# Patient Record
Sex: Female | Born: 1950 | Race: White | Hispanic: No | Marital: Married | Smoking: Former smoker
Health system: Southern US, Community
[De-identification: ages and names within clinical notes are randomized; demographics above are authoritative.]

## PROBLEM LIST (undated history)

## (undated) DIAGNOSIS — Z9221 Personal history of antineoplastic chemotherapy: Secondary | ICD-10-CM

## (undated) DIAGNOSIS — E7800 Pure hypercholesterolemia, unspecified: Secondary | ICD-10-CM

## (undated) DIAGNOSIS — C3490 Malignant neoplasm of unspecified part of unspecified bronchus or lung: Secondary | ICD-10-CM

## (undated) DIAGNOSIS — Z923 Personal history of irradiation: Secondary | ICD-10-CM

## (undated) DIAGNOSIS — I82409 Acute embolism and thrombosis of unspecified deep veins of unspecified lower extremity: Secondary | ICD-10-CM

---

## 2000-05-31 ENCOUNTER — Other Ambulatory Visit: Admission: RE | Admit: 2000-05-31 | Discharge: 2000-05-31 | Payer: Self-pay | Admitting: Obstetrics and Gynecology

## 2002-04-03 ENCOUNTER — Other Ambulatory Visit: Admission: RE | Admit: 2002-04-03 | Discharge: 2002-04-03 | Payer: Self-pay | Admitting: Obstetrics and Gynecology

## 2002-12-24 ENCOUNTER — Emergency Department (HOSPITAL_COMMUNITY): Admission: EM | Admit: 2002-12-24 | Discharge: 2002-12-24 | Payer: Self-pay | Admitting: Emergency Medicine

## 2002-12-24 ENCOUNTER — Encounter: Payer: Self-pay | Admitting: Emergency Medicine

## 2003-11-13 ENCOUNTER — Encounter: Admission: RE | Admit: 2003-11-13 | Discharge: 2003-11-13 | Payer: Self-pay | Admitting: Obstetrics and Gynecology

## 2003-12-27 ENCOUNTER — Other Ambulatory Visit: Admission: RE | Admit: 2003-12-27 | Discharge: 2003-12-27 | Payer: Self-pay | Admitting: Obstetrics and Gynecology

## 2004-03-06 ENCOUNTER — Ambulatory Visit: Payer: Self-pay | Admitting: Internal Medicine

## 2004-03-06 ENCOUNTER — Encounter: Admission: RE | Admit: 2004-03-06 | Discharge: 2004-03-06 | Payer: Self-pay | Admitting: Internal Medicine

## 2004-03-07 ENCOUNTER — Ambulatory Visit: Payer: Self-pay | Admitting: Internal Medicine

## 2004-05-12 ENCOUNTER — Ambulatory Visit: Payer: Self-pay | Admitting: Internal Medicine

## 2004-05-16 ENCOUNTER — Encounter: Admission: RE | Admit: 2004-05-16 | Discharge: 2004-05-16 | Payer: Self-pay | Admitting: Internal Medicine

## 2004-10-13 ENCOUNTER — Ambulatory Visit: Payer: Self-pay | Admitting: Internal Medicine

## 2004-11-25 ENCOUNTER — Ambulatory Visit: Payer: Self-pay | Admitting: Internal Medicine

## 2005-09-10 ENCOUNTER — Ambulatory Visit: Payer: Self-pay | Admitting: Internal Medicine

## 2011-03-07 LAB — HM COLONOSCOPY: HM Colonoscopy: NORMAL

## 2011-03-07 LAB — HM PAP SMEAR: HM Pap smear: NEGATIVE

## 2011-03-07 LAB — HM MAMMOGRAPHY: HM Mammogram: NEGATIVE

## 2011-10-14 ENCOUNTER — Emergency Department (HOSPITAL_COMMUNITY)
Admission: EM | Admit: 2011-10-14 | Discharge: 2011-10-14 | Disposition: A | Discharge status: Home / Self Care | Payer: 59 | Attending: Emergency Medicine | Admitting: Emergency Medicine

## 2011-10-14 ENCOUNTER — Encounter (HOSPITAL_COMMUNITY): Payer: Self-pay | Admitting: Emergency Medicine

## 2011-10-14 DIAGNOSIS — S61209A Unspecified open wound of unspecified finger without damage to nail, initial encounter: Secondary | ICD-10-CM | POA: Insufficient documentation

## 2011-10-14 DIAGNOSIS — Z86718 Personal history of other venous thrombosis and embolism: Secondary | ICD-10-CM | POA: Insufficient documentation

## 2011-10-14 DIAGNOSIS — Y92009 Unspecified place in unspecified non-institutional (private) residence as the place of occurrence of the external cause: Secondary | ICD-10-CM | POA: Insufficient documentation

## 2011-10-14 DIAGNOSIS — Y9389 Activity, other specified: Secondary | ICD-10-CM | POA: Insufficient documentation

## 2011-10-14 DIAGNOSIS — W278XXA Contact with other nonpowered hand tool, initial encounter: Secondary | ICD-10-CM | POA: Insufficient documentation

## 2011-10-14 DIAGNOSIS — S61412A Laceration without foreign body of left hand, initial encounter: Secondary | ICD-10-CM

## 2011-10-14 DIAGNOSIS — E7800 Pure hypercholesterolemia, unspecified: Secondary | ICD-10-CM | POA: Insufficient documentation

## 2011-10-14 DIAGNOSIS — T148XXA Other injury of unspecified body region, initial encounter: Secondary | ICD-10-CM

## 2011-10-14 DIAGNOSIS — Y998 Other external cause status: Secondary | ICD-10-CM | POA: Insufficient documentation

## 2011-10-14 DIAGNOSIS — Z7901 Long term (current) use of anticoagulants: Secondary | ICD-10-CM | POA: Insufficient documentation

## 2011-10-14 HISTORY — DX: Pure hypercholesterolemia, unspecified: E78.00

## 2011-10-14 HISTORY — DX: Acute embolism and thrombosis of unspecified deep veins of unspecified lower extremity: I82.409

## 2011-10-14 LAB — PROTIME-INR
INR: 2.88 — ABNORMAL HIGH (ref 0.00–1.49)
Prothrombin Time: 30.6 seconds — ABNORMAL HIGH (ref 11.6–15.2)

## 2011-10-14 MED ORDER — TETANUS-DIPHTH-ACELL PERTUSSIS 5-2.5-18.5 LF-MCG/0.5 IM SUSP
0.5000 mL | Freq: Once | INTRAMUSCULAR | Status: AC
  Administered 2011-10-14: 0.5 mL via INTRAMUSCULAR
  Filled 2011-10-14: qty 0.5

## 2011-10-14 NOTE — Discharge Instructions (Signed)
Keep pressure dressing on. Keep hand elevated. Ice it several times a day. Apply neosporin twice a day. Watch for signs of infection such as redness, drainage. Follow up with a family doctor. Return if swelling worsening, have numbness or tingling in fingers, uncontrolled bleeding.   Laceration Care, Adult A laceration is a cut or lesion that goes through all layers of the skin and into the tissue just beneath the skin. TREATMENT  Some lacerations may not require closure. Some lacerations may not be able to be closed due to an increased risk of infection. It is important to see your caregiver as soon as possible after an injury to minimize the risk of infection and maximize the opportunity for successful closure. If closure is appropriate, pain medicines may be given, if needed. The wound will be cleaned to help prevent infection. Your caregiver will use stitches (sutures), staples, wound glue (adhesive), or skin adhesive strips to repair the laceration. These tools bring the skin edges together to allow for faster healing and a better cosmetic outcome. However, all wounds will heal with a scar. Once the wound has healed, scarring can be minimized by covering the wound with sunscreen during the day for 1 full year. HOME CARE INSTRUCTIONS  For sutures or staples:  Keep the wound clean and dry.   If you were given a bandage (dressing), you should change it at least once a day. Also, change the dressing if it becomes wet or dirty, or as directed by your caregiver.   Wash the wound with soap and water 2 times a day. Rinse the wound off with water to remove all soap. Pat the wound dry with a clean towel.   After cleaning, apply a thin layer of the antibiotic ointment as recommended by your caregiver. This will help prevent infection and keep the dressing from sticking.   You may shower as usual after the first 24 hours. Do not soak the wound in water until the sutures are removed.   Only take  over-the-counter or prescription medicines for pain, discomfort, or fever as directed by your caregiver.   Get your sutures or staples removed as directed by your caregiver.  For skin adhesive strips:  Keep the wound clean and dry.   Do not get the skin adhesive strips wet. You may bathe carefully, using caution to keep the wound dry.   If the wound gets wet, pat it dry with a clean towel.   Skin adhesive strips will fall off on their own. You may trim the strips as the wound heals. Do not remove skin adhesive strips that are still stuck to the wound. They will fall off in time.  For wound adhesive:  You may briefly wet your wound in the shower or bath. Do not soak or scrub the wound. Do not swim. Avoid periods of heavy perspiration until the skin adhesive has fallen off on its own. After showering or bathing, gently pat the wound dry with a clean towel.   Do not apply liquid medicine, cream medicine, or ointment medicine to your wound while the skin adhesive is in place. This may loosen the film before your wound is healed.   If a dressing is placed over the wound, be careful not to apply tape directly over the skin adhesive. This may cause the adhesive to be pulled off before the wound is healed.   Avoid prolonged exposure to sunlight or tanning lamps while the skin adhesive is in place. Exposure to ultraviolet  light in the first year will darken the scar.   The skin adhesive will usually remain in place for 5 to 10 days, then naturally fall off the skin. Do not pick at the adhesive film.  You may need a tetanus shot if:  You cannot remember when you had your last tetanus shot.   You have never had a tetanus shot.  If you get a tetanus shot, your arm may swell, get red, and feel warm to the touch. This is common and not a problem. If you need a tetanus shot and you choose not to have one, there is a rare chance of getting tetanus. Sickness from tetanus can be serious. SEEK MEDICAL CARE  IF:   You have redness, swelling, or increasing pain in the wound.   You see a red line that goes away from the wound.   You have yellowish-white fluid (pus) coming from the wound.   You have a fever.   You notice a bad smell coming from the wound or dressing.   Your wound breaks open before or after sutures have been removed.   You notice something coming out of the wound such as wood or glass.   Your wound is on your hand or foot and you cannot move a finger or toe.  SEEK IMMEDIATE MEDICAL CARE IF:   Your pain is not controlled with prescribed medicine.   You have severe swelling around the wound causing pain and numbness or a change in color in your arm, hand, leg, or foot.   Your wound splits open and starts bleeding.   You have worsening numbness, weakness, or loss of function of any joint around or beyond the wound.   You develop painful lumps near the wound or on the skin anywhere on your body.  MAKE SURE YOU:   Understand these instructions.   Will watch your condition.   Will get help right away if you are not doing well or get worse.  Document Released: 04/20/2005 Document Revised: 04/09/2011 Document Reviewed: 10/14/2010 [facility] Patient Information 2012 [place], [place]. Hematoma A hematoma is a pocket of blood that collects under the skin, in an organ, in a body space, in a joint space, or in other tissue. The blood can clot to form a lump that you can see and feel. The lump is often firm, sore, and sometimes even painful and tender. Most hematomas get better in a few days to weeks. However, some hematomas may be serious and require medical care.Hematomas can range in size from very small to very large. CAUSES  A hematoma can be caused by a blunt or penetrating injury. It can also be caused by leakage from a blood vessel under the skin. Spontaneous leakage from a blood vessel is more likely to occur in elderly people, especially those taking blood thinners.  Sometimes, a hematoma can develop after certain medical procedures. SYMPTOMS  Unlike a bruise, a hematoma forms a firm lump that you can feel. This lump is the collection of blood. The collection of blood can also cause your skin to turn a blue to dark blue color. If the hematoma is close to the surface of the skin, it often produces a yellowish color in the skin. DIAGNOSIS  Your caregiver can determine whether you have a hematoma based on your history and a physical exam. TREATMENT  Hematomas usually go away on their own over time. Rarely does the blood need to be drained out of the body. HOME CARE  INSTRUCTIONS   Put ice on the injured area.   Put ice in a plastic bag.   Place a towel between your skin and the bag.   Leave the ice on for 15 to 20 minutes, 3 to 4 times a day for the first 1 to 2 days.   After the first 2 days, switch to using warm compresses on the hematoma.   Elevate the injured area to help decrease pain and swelling. Wrapping the area with an elastic bandage may also be helpful. Compression helps to reduce swelling and promotes shrinking of the hematoma. Make sure the bandage is not wrapped too tight.   If your hematoma is on a lower extremity and is painful, crutches may be helpful for a couple days.   Only take over-the-counter or prescription medicines for pain, discomfort, or fever as directed by your caregiver. Most patients can take acetaminophen or ibuprofen for the pain.  SEEK IMMEDIATE MEDICAL CARE IF:   You have increasing pain, or your pain is not controlled with medicine.   You have a fever.   You have worsening swelling or discoloration.   Your skin over the hematoma breaks or starts bleeding.  MAKE SURE YOU:   Understand these instructions.   Will watch your condition.   Will get help right away if you are not doing well or get worse.  Document Released: 12/03/2003 Document Revised: 04/09/2011 Document Reviewed: 12/22/2010 [facility] Patient  Information 2012 [place], [place].

## 2011-10-14 NOTE — ED Notes (Signed)
Patient states that she was trying to open a box when the scissors cut her Left had. Bruising and laceration noted

## 2011-10-14 NOTE — ED Provider Notes (Signed)
History     CSN: [id number]  Arrival date & time 10/14/11  1811   First MD Initiated Contact with Patient 10/14/11 1818      Chief Complaint  Patient presents with  . Extremity Laceration  . Hand Injury    (Consider location/radiation/quality/duration/timing/severity/associated sxs/prior treatment) Patient is a 61 y.o. female presenting with hand injury. The history is provided by the patient.  Hand Injury  The incident occurred 6 to 12 hours ago. The incident occurred at home. Pertinent negatives include no fever.  Pt states she was opening a box with a pair of scissors and cut her left hand. States it is a puncture mark, bleeding stopped, however, area began to swell. Pt states she is worried because she is on coumadin. Pt denies difficulty moving fingers, numbness, tingling in fingers. No other injuries.   Past Medical History  Diagnosis Date  . DVT (deep venous thrombosis)     1 year ago  . Hypercholesteremia     Past Surgical History  Procedure Date  . Cesarean section     History reviewed. No pertinent family history.  History  Substance Use Topics  . Smoking status: Never Smoker   . Smokeless tobacco: Not on file  . Alcohol Use: No    OB History    Grav Para Term Preterm Abortions TAB SAB Ect Mult Living                  Review of Systems  Constitutional: Negative for fever and chills.  Respiratory: Negative.   Cardiovascular: Negative.   Musculoskeletal: Negative.   Skin: Positive for wound.  Neurological: Negative for weakness and numbness.  Hematological: Bruises/bleeds easily.    Allergies  Review of patient's allergies indicates no known allergies.  Home Medications   Current Outpatient Rx  Name Route Sig Dispense Refill  . ACETAMINOPHEN 500 MG PO TABS Oral Take 500 mg by mouth every 6 (six) hours as needed. For pain.    . ATORVASTATIN CALCIUM 10 MG PO TABS Oral Take 10 mg by mouth daily.    Marland Kitchen VITAMIN D (ERGOCALCIFEROL) 50000 UNITS PO  CAPS Oral Take 50,000 Units by mouth every 7 (seven) days.    . WARFARIN SODIUM 3 MG PO TABS Oral Take 3 mg by mouth daily. Taken with 5mg  tablet to equal 8mg  daily.    . WARFARIN SODIUM 5 MG PO TABS Oral Take 5 mg by mouth daily. Taken with 3mg  tablet to equal 8mg  daily.      BP 131/94  Pulse 89  Temp 98.4 F (36.9 C) (Oral)  Resp 16  SpO2 100%  Physical Exam  Nursing note and vitals reviewed. Constitutional: She is oriented to person, place, and time. She appears well-developed and well-nourished. No distress.  HENT:  Head: Normocephalic.  Eyes: Conjunctivae are normal.  Cardiovascular: Normal rate, regular rhythm and normal heart sounds.   Pulmonary/Chest: Effort normal and breath sounds normal. No respiratory distress. She has no wheezes. She has no rales.  Musculoskeletal: Normal range of motion.       Full ROM of all fingers, normal strength against resistance. Normal sensation of all fingers. Good cap refill <2sec of all distal fingers  Neurological: She is alert and oriented to person, place, and time.  Skin: Skin is warm and dry.       2cm laceration to the dorsal ulnar hand. Hemostatic. Surrounded by hematoma, about 4cm in diameter. Tender.     ED Course  Procedures (including critical care  time)  Left hand puncture wound with hematoma. Pt on coumadin. No evidence of compartment syndrome. Good cap refil. Wound hemostatic. Will check INR. Pressure dressing and ACE wrap applied to the hand for swelling.  will  d /c home with follow up.   1. Laceration of left hand   2. Hematoma       MDM         Lottie Mussel, PA 10/15/11 [phone]

## 2011-10-16 NOTE — ED Provider Notes (Signed)
Medical screening examination/treatment/procedure(s) were performed by non-physician practitioner and as supervising physician I was immediately available for consultation/collaboration.  Flint Melter, MD 10/16/11 1008

## 2011-11-04 ENCOUNTER — Encounter: Payer: Self-pay | Admitting: Family Medicine

## 2011-11-04 ENCOUNTER — Ambulatory Visit (INDEPENDENT_AMBULATORY_CARE_PROVIDER_SITE_OTHER): Payer: 59 | Admitting: Family Medicine

## 2011-11-04 VITALS — BP 110/72 | HR 80 | Temp 98.2°F | Resp 12 | Ht 64.75 in | Wt 156.0 lb

## 2011-11-04 DIAGNOSIS — I82409 Acute embolism and thrombosis of unspecified deep veins of unspecified lower extremity: Secondary | ICD-10-CM

## 2011-11-04 DIAGNOSIS — E785 Hyperlipidemia, unspecified: Secondary | ICD-10-CM

## 2011-11-04 DIAGNOSIS — F17200 Nicotine dependence, unspecified, uncomplicated: Secondary | ICD-10-CM

## 2011-11-04 NOTE — Progress Notes (Signed)
  Subjective:    Patient ID: Jenny [name], female    DOB: 19-Feb-1951, 61 y.o.   MRN: [id number]  HPI  Patient is seen to establish care. Was seen at this practice remotely several years ago. She and her husband just recently moved back from [place]. Past medical history significant for recurrent DVT right lower extremity. She takes Coumadin 8 mg daily. Last INR on 6-12 2.88. Needs to establish for that. Hyperlipidemia treated with Lipitor 10 mg daily. No history of CAD or peripheral vascular disease. Reportedly history of prediabetes but recent labs prior to leaving [place]. Ongoing nicotine use. Previously prescribe Chantix but never started. Smokes less than one pack a day.  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    She had complete physical last November with normal Pap smear, normal mammogram, and normal colonoscopy.  She is married. No alcohol use. Smoking history as above. Family history significant for followup colon cancer history. Mother had hypertension and stroke history.   Review of Systems  Constitutional: Negative for fever, chills, activity change, appetite change, fatigue and unexpected weight change.  Respiratory: Negative for cough and shortness of breath.   Cardiovascular: Negative for leg swelling.       Objective:   Physical Exam  Constitutional: She is oriented to person, place, and time. She appears well-developed and well-nourished.  HENT:  Mouth/Throat: Oropharynx is clear and moist.  Neck: Neck supple. No thyromegaly present.       No carotid bruits  Cardiovascular: Normal rate and regular rhythm.  Exam reveals no gallop.   Pulmonary/Chest: Effort normal and breath sounds normal. No respiratory distress. She has no wheezes. She has no rales.  Musculoskeletal: She exhibits no edema.  Neurological: She is alert and oriented to person, place, and time. No cranial nerve deficit.  Psychiatric: She has a normal mood and affect. Her behavior is normal.           Assessment & Plan:  #1 history of recurrent DVT right lower extremity. Patient on chronic Coumadin. Recent INR 2 weeks ago therapeutic. Schedule repeat INR in Coumadin clinic here in 2 weeks #2 hyperlipidemia on Lipitor. Send for records  #3 history of reported prediabetes  #4 ongoing nicotine use. Patient will consider starting back Chantix which she has prescription for.

## 2011-11-18 ENCOUNTER — Encounter: Payer: 59 | Admitting: Family

## 2012-01-22 ENCOUNTER — Telehealth: Payer: Self-pay | Admitting: Family Medicine

## 2012-01-22 NOTE — Telephone Encounter (Signed)
Caller: Annya/Patient; Patient Name: Jenny [name]; PCP: Evelena Peat [facility]); Best Callback Phone Number: [phone]; Reason for call: Chest Pain/Chest Discomfort THE PATIENT REFUSED 911 Has pain in chest since 9-20. Started 30 minutes prior to call. Is "tightening" pain.  Is severe when happens.  Pain will last 10 seconds and then start again "a few minutes later". Has had "flutttering" of heart. Advised 911.

## 2012-01-22 NOTE — Telephone Encounter (Signed)
Spoke with Dr Caryl Never and he agrees that patient should go to the ER.  Spoke with patient and states that she "is talking with her husband and probably will go to the hospital. The chest pain was getting a little better, but she will go to the hospital if needed".

## 2012-02-25 ENCOUNTER — Encounter: Payer: Self-pay | Admitting: Family Medicine

## 2012-02-25 ENCOUNTER — Ambulatory Visit (INDEPENDENT_AMBULATORY_CARE_PROVIDER_SITE_OTHER): Payer: 59 | Admitting: Family

## 2012-02-25 ENCOUNTER — Ambulatory Visit (INDEPENDENT_AMBULATORY_CARE_PROVIDER_SITE_OTHER)
Admission: RE | Admit: 2012-02-25 | Discharge: 2012-02-25 | Disposition: A | Discharge status: Home / Self Care | Payer: 59 | Source: Ambulatory Visit | Attending: Family Medicine | Admitting: Family Medicine

## 2012-02-25 ENCOUNTER — Ambulatory Visit (INDEPENDENT_AMBULATORY_CARE_PROVIDER_SITE_OTHER): Payer: 59 | Admitting: Family Medicine

## 2012-02-25 VITALS — BP 98/62 | Temp 98.2°F | Wt 154.0 lb

## 2012-02-25 DIAGNOSIS — M79651 Pain in right thigh: Secondary | ICD-10-CM

## 2012-02-25 DIAGNOSIS — R208 Other disturbances of skin sensation: Secondary | ICD-10-CM

## 2012-02-25 DIAGNOSIS — M79609 Pain in unspecified limb: Secondary | ICD-10-CM

## 2012-02-25 DIAGNOSIS — R209 Unspecified disturbances of skin sensation: Secondary | ICD-10-CM

## 2012-02-25 DIAGNOSIS — E785 Hyperlipidemia, unspecified: Secondary | ICD-10-CM

## 2012-02-25 DIAGNOSIS — I82409 Acute embolism and thrombosis of unspecified deep veins of unspecified lower extremity: Secondary | ICD-10-CM

## 2012-02-25 NOTE — Patient Instructions (Addendum)
Follow up immediately for any loss of urine or stool control. Follow up immediately for any rapid progression of weakness right lower extremity

## 2012-02-25 NOTE — Progress Notes (Signed)
  Subjective:    Patient ID: Jenny [name], female    DOB: 04/25/1951, 61 y.o.   MRN: [id number]  HPI  Right lower any pain. Patient relates a least 2 weeks of some burning discomfort and occasional numbness right lateral thigh. No low back pain. No leg pain. No known injury. Pain is 8/10 at its worst. Frequently noted at rest. She's also noted questionable weakness with going up stairs and climbing hills. No urine or stool incontinence. No fevers or chills. She has history of right lower extremity DVT but this pain is much different. She remains on Coumadin with INR 4.5 today. No recent bleeding complications. Does not report any claudication-type symptoms.  Past Medical History  Diagnosis Date  . DVT (deep venous thrombosis)     1 year ago  . Hypercholesteremia    Past Surgical History  Procedure Date  . Cesarean section     reports that she has been smoking Cigarettes.  She has a 19.5 pack-year smoking history. She does not have any smokeless tobacco history on file. She reports that she does not drink alcohol or use illicit drugs. family history includes Cancer in her father; Hypertension in her mother; and Stroke in her mother. No Known Allergies    Review of Systems  Constitutional: Negative for appetite change and unexpected weight change.  Respiratory: Negative for cough and shortness of breath.   Neurological: Positive for weakness and numbness.  Hematological: Does not bruise/bleed easily.       Objective:   Physical Exam  Constitutional: She appears well-developed and well-nourished.  Cardiovascular: Normal rate and regular rhythm.   Pulmonary/Chest: Effort normal and breath sounds normal. No respiratory distress. She has no wheezes. She has no rales.  Musculoskeletal:       Full range of motion right hip. No lower extremity edema. Faintly palpable dorsalis pedis pulses bilaterally. Patient has some subtle decreased muscle tone right quadriceps compared to left.    Neurological:       Deep tender reflexes 2+ and symmetric ankle and 2+ knee bilaterally. She has slight weakness with right knee extension and hip flexion. Mild impairment to touch right lateral thigh          Assessment & Plan:  Right lower extremity pain. Burning sensation and numbness right lateral thigh along with weakness with knee extension suggest possible L3 nerve root impingement. Start with lumbar spine films. May need MRI to further assess Hyperlipidemia.  Check lipid and hepatic panel.

## 2012-02-25 NOTE — Progress Notes (Signed)
  Subjective:    Patient ID: Jenny [name], female    DOB: 1950/10/05, 61 y.o.   MRN: [id number]  HPI    Review of Systems  Constitutional: Negative for appetite change and unexpected weight change.       Objective:   Physical Exam        Assessment & Plan:

## 2012-02-25 NOTE — Patient Instructions (Signed)
Hold Coumadin x 2 days. Eat plenty of greens. Then resume Coumadin at 8mg . Recheck INR in 2 weeks.     Latest dosing instructions   Total Sun Mon Tue Wed Thu Fri Sat   56 5 mg 5 mg 5 mg 5 mg 5 mg 5 mg 5 mg    (5 mg1) (5 mg1) (5 mg1) (5 mg1) (5 mg1) (5 mg1) (5 mg1)    Addl Tabs  3 mg 3 mg 3 mg 3 mg 3 mg 3 mg 3 mg    (3 mg1) (3 mg1) (3 mg1) (3 mg1) (3 mg1) (3 mg1) (3 mg1)

## 2012-02-26 ENCOUNTER — Other Ambulatory Visit: Payer: Self-pay | Admitting: *Deleted

## 2012-02-26 MED ORDER — PREDNISONE 10 MG PO TABS: 10.0000 mg | ORAL_TABLET | Freq: Every day | ORAL | Status: DC

## 2012-02-26 NOTE — Progress Notes (Signed)
Quick Note:  Pt informed on home VM, work phone was not answered. ______

## 2012-02-26 NOTE — Progress Notes (Signed)
Quick Note:  Pt informed on home VM, will send med to pharmacy ______

## 2012-02-27 ENCOUNTER — Encounter (HOSPITAL_COMMUNITY): Payer: Self-pay | Admitting: Emergency Medicine

## 2012-02-27 ENCOUNTER — Emergency Department (HOSPITAL_COMMUNITY): Payer: 59

## 2012-02-27 ENCOUNTER — Emergency Department (HOSPITAL_COMMUNITY)
Admission: EM | Admit: 2012-02-27 | Discharge: 2012-02-27 | Disposition: A | Discharge status: Home / Self Care | Payer: 59 | Attending: Emergency Medicine | Admitting: Emergency Medicine

## 2012-02-27 DIAGNOSIS — F17200 Nicotine dependence, unspecified, uncomplicated: Secondary | ICD-10-CM | POA: Insufficient documentation

## 2012-02-27 DIAGNOSIS — Z79899 Other long term (current) drug therapy: Secondary | ICD-10-CM | POA: Insufficient documentation

## 2012-02-27 DIAGNOSIS — Z86718 Personal history of other venous thrombosis and embolism: Secondary | ICD-10-CM | POA: Insufficient documentation

## 2012-02-27 DIAGNOSIS — M25569 Pain in unspecified knee: Secondary | ICD-10-CM | POA: Insufficient documentation

## 2012-02-27 DIAGNOSIS — E7800 Pure hypercholesterolemia, unspecified: Secondary | ICD-10-CM | POA: Insufficient documentation

## 2012-02-27 MED ORDER — OXYCODONE-ACETAMINOPHEN 5-325 MG PO TABS: ORAL_TABLET | ORAL | Status: DC

## 2012-02-27 NOTE — ED Provider Notes (Signed)
History     CSN: [id number]  Arrival date & time 02/27/12  1521   First MD Initiated Contact with Patient 02/27/12 1639      Chief Complaint  Patient presents with  . Knee Pain    Right  . Ankle Pain    Right    (Consider location/radiation/quality/duration/timing/severity/associated sxs/prior treatment) Patient is a 61 y.o. female presenting with knee pain and ankle pain.  Knee Pain Associated symptoms include arthralgias. Pertinent negatives include no abdominal pain, chest pain, fever, nausea or vomiting.  Ankle Pain   ARYONA SILL is a 61 y.o. female complaining of right lateral knee pain acute onset this a.m. Patient was walking in the yard and her knee gave out. At that point she heard a pop. Pain is exacerbated by weightbearing. No prior similar incident. Pain is 7/10. Past medical history significant for DVTs to this leg Lyman Bishop in June of 2013. Patient is taking Coumadin. She was super therapeutic at 4.0 several days ago she said to restart taking her Coumadin tonight. Denies shortness of breath or calf pain.    Past Medical History  Diagnosis Date  . DVT (deep venous thrombosis)     1 year ago  . Hypercholesteremia     Past Surgical History  Procedure Date  . Cesarean section     Family History  Problem Relation Age of Onset  . Stroke Mother   . Hypertension Mother   . Cancer Father     colon    History  Substance Use Topics  . Smoking status: Current Every Day Smoker -- 0.5 packs/day for 39 years    Types: Cigarettes  . Smokeless tobacco: Not on file  . Alcohol Use: No    OB History    Grav Para Term Preterm Abortions TAB SAB Ect Mult Living                  Review of Systems  Constitutional: Negative for fever.  Respiratory: Negative for shortness of breath.   Cardiovascular: Negative for chest pain.  Gastrointestinal: Negative for nausea, vomiting, abdominal pain and diarrhea.  Musculoskeletal: Positive for arthralgias.  All other  systems reviewed and are negative.    Allergies  Review of patient's allergies indicates no known allergies.  Home Medications   Current Outpatient Rx  Name Route Sig Dispense Refill  . ATORVASTATIN CALCIUM 10 MG PO TABS Oral Take 10 mg by mouth daily.    Marland Kitchen PREDNISONE 10 MG PO TABS Oral Take 10-40 mg by mouth daily. Taper pack; take 4 tabs for 4 days, then 3 tabs for 2 days, then 2 tabs for one day, then one tab for one day.  Started 02/26/12    . VITAMIN D (ERGOCALCIFEROL) 50000 UNITS PO CAPS Oral Take 50,000 Units by mouth every 7 (seven) days. Taken on Thursdays.    . WARFARIN SODIUM 3 MG PO TABS Oral Take 3 mg by mouth daily. Taken with 5mg  tablet to equal 8mg  daily.    . WARFARIN SODIUM 5 MG PO TABS Oral Take 5 mg by mouth daily. Taken with 3mg  tablet to equal 8mg  daily.      BP 107/74  Pulse 101  Temp 98.3 F (36.8 C) (Oral)  Resp 16  Ht 5\' 4"  (1.626 m)  Wt 150 lb (68.04 kg)  BMI 25.75 kg/m2  SpO2 95%  Physical Exam  Nursing note and vitals reviewed. Constitutional: She is oriented to person, place, and time. She appears well-developed and well-nourished. No  distress.  HENT:  Head: Normocephalic.  Eyes: Conjunctivae normal and EOM are normal.  Cardiovascular: Normal rate.   Pulmonary/Chest: Effort normal. No stridor.  Abdominal: Soft.  Musculoskeletal: Normal range of motion.       Right knee: No deformity, erythema or abrasions. FROM. No effusion or crepitance. Anterior and posterior drawer show no abnormal laxity. Stable to valgus and varus stress.  lateral Joint line are tender. Neurovascularly intact. Pt ambulates with antalgic gait.   Appley grind test is positive.  Calf is nontender to palpation, no palpable cords. Positive superficial collateral veins  Neurological: She is alert and oriented to person, place, and time.  Psychiatric: She has a normal mood and affect.    ED Course  Procedures (including critical care time)  Labs Reviewed - No data to  display Dg Ankle Complete Right  02/27/2012  *RADIOLOGY REPORT*  Clinical Data: Ankle pain  RIGHT ANKLE - COMPLETE 3+ VIEW  Comparison: None.  Findings: Ankle mortise intact.  Talar dome is normal.  No malleolar fracture.  No joint effusion.  The calcaneus is normal.  IMPRESSION: No ankle fracture.   Original Report Authenticated By: Genevive Bi, M.D.    Dg Knee Complete 4 Views Right  02/27/2012  *RADIOLOGY REPORT*  Clinical Data: 62 year old female with acute onset knee pain radiating laterally.  RIGHT KNEE - COMPLETE 4+ VIEW  Comparison: None.  Findings: No definite joint effusion. Bone mineralization is within normal limits.  Patella intact.  Joint spaces preserved. Artifactual diagonal lucent line on the AP view extending over both the distal femur and proximal tibia.  No acute fracture identified.  IMPRESSION: No acute fracture or dislocation identified about the right knee.   Original Report Authenticated By: Harley Hallmark, M.D.      1. Knee pain, acute       MDM  Explained to patient that I believe she has a meniscal injury. She will have to follow with orthopedist for definitive diagnosis and treatment. I will give her crutches, a knee sleeve and pain medication.    Pt verbalized understanding and agrees with care plan. Outpatient follow-up and return precautions given.     New Prescriptions   OXYCODONE-ACETAMINOPHEN (PERCOCET/ROXICET) 5-325 MG PER TABLET    1 to 2 tabs PO q6hrs  PRN for pain          Wynetta Emery, PA-C 02/27/12 1908

## 2012-02-27 NOTE — ED Notes (Addendum)
Pt was walking in yard and heard something "pop". Pt c/o R knee pain and ankle pain. Pt reports that she fell but did not hit her head or lose consciousness. Pt denies SOB. Pt states that she was dx with a DVT approx 1.5 yrs ago, resolved and then came back in June 2013. Pt takes Coumadin to tx DVT. Pt has no other c/o and is in NAD. Pt A&Ox4

## 2012-02-27 NOTE — ED Notes (Signed)
Ortho tech called, will see pt in ED

## 2012-02-28 NOTE — ED Provider Notes (Signed)
Medical screening examination/treatment/procedure(s) were performed by non-physician practitioner and as supervising physician I was immediately available for consultation/collaboration.    Celene Kras, MD 02/28/12 [phone]

## 2012-03-01 ENCOUNTER — Encounter: Payer: 59 | Admitting: Family Medicine

## 2012-03-01 NOTE — Progress Notes (Signed)
No show  This encounter was created in error - please disregard.

## 2012-03-03 ENCOUNTER — Other Ambulatory Visit (INDEPENDENT_AMBULATORY_CARE_PROVIDER_SITE_OTHER): Payer: 59

## 2012-03-03 DIAGNOSIS — E785 Hyperlipidemia, unspecified: Secondary | ICD-10-CM

## 2012-03-03 LAB — HEPATIC FUNCTION PANEL
ALT: 14 U/L (ref 0–35)
AST: 14 U/L (ref 0–37)
Albumin: 3.8 g/dL (ref 3.5–5.2)
Alkaline Phosphatase: 57 U/L (ref 39–117)
Bilirubin, Direct: 0.1 mg/dL (ref 0.0–0.3)
Total Bilirubin: 0.4 mg/dL (ref 0.3–1.2)
Total Protein: 6.9 g/dL (ref 6.0–8.3)

## 2012-03-03 LAB — BASIC METABOLIC PANEL
BUN: 19 mg/dL (ref 6–23)
CO2: 29 mEq/L (ref 19–32)
Calcium: 9.1 mg/dL (ref 8.4–10.5)
Chloride: 104 mEq/L (ref 96–112)
Creatinine, Ser: 0.8 mg/dL (ref 0.4–1.2)
GFR: 77.96 mL/min (ref >60.00)
Glucose, Bld: 82 mg/dL (ref 70–99)
Potassium: 3.9 mEq/L (ref 3.5–5.1)
Sodium: 140 mEq/L (ref 135–145)

## 2012-03-03 LAB — LIPID PANEL
Cholesterol: 169 mg/dL (ref 0–200)
HDL: 35.6 mg/dL — ABNORMAL LOW (ref >39.00)
Total CHOL/HDL Ratio: 5
Triglycerides: 260 mg/dL — ABNORMAL HIGH (ref 0.0–149.0)
VLDL: 52 mg/dL — ABNORMAL HIGH (ref 0.0–40.0)

## 2012-03-03 LAB — LDL CHOLESTEROL, DIRECT: Direct LDL: 109.6 mg/dL

## 2012-03-04 NOTE — Progress Notes (Signed)
Quick Note:  Noted - appt 04-14-12 ______

## 2012-03-10 ENCOUNTER — Encounter: Payer: 59 | Admitting: Family

## 2012-03-11 ENCOUNTER — Ambulatory Visit (INDEPENDENT_AMBULATORY_CARE_PROVIDER_SITE_OTHER): Payer: 59 | Admitting: Family

## 2012-03-11 DIAGNOSIS — I82409 Acute embolism and thrombosis of unspecified deep veins of unspecified lower extremity: Secondary | ICD-10-CM

## 2012-03-11 LAB — POCT INR: INR: 4.2

## 2012-03-11 NOTE — Patient Instructions (Addendum)
Hold Coumadin x 2 days. Eat plenty of greens. Then resume Coumadin at 8mg  all days except Monday, Wed, Friday 5mg . Recheck INR in 2 weeks.     Latest dosing instructions   Total Sun Mon Tue Wed Thu Fri Sat   47 5 mg 5 mg 5 mg 5 mg 5 mg 5 mg 5 mg    (5 mg1) (5 mg1) (5 mg1) (5 mg1) (5 mg1) (5 mg1) (5 mg1)    Addl Tabs  3 mg  3 mg  3 mg  3 mg    (3 mg1)  (3 mg1)  (3 mg1)  (3 mg1)

## 2012-03-21 ENCOUNTER — Telehealth: Payer: Self-pay | Admitting: Family Medicine

## 2012-03-21 MED ORDER — VITAMIN D (ERGOCALCIFEROL) 1.25 MG (50000 UNIT) PO CAPS: 50000.0000 [IU] | ORAL_CAPSULE | ORAL | Status: DC

## 2012-03-21 MED ORDER — ATORVASTATIN CALCIUM 10 MG PO TABS: 10.0000 mg | ORAL_TABLET | Freq: Every day | ORAL | Status: DC

## 2012-03-21 MED ORDER — WARFARIN SODIUM 3 MG PO TABS: 3.0000 mg | ORAL_TABLET | Freq: Every day | ORAL | Status: DC

## 2012-03-21 MED ORDER — WARFARIN SODIUM 5 MG PO TABS: 5.0000 mg | ORAL_TABLET | Freq: Every day | ORAL | Status: DC

## 2012-03-21 NOTE — Telephone Encounter (Signed)
Pt called and said that she is completely out of the following meds. warfarin (COUMADIN) 5 MG tablet , warfarin (COUMADIN) 3 MG tablet , atorvastatin (LIPITOR) 10 MG tablet , Vitamin D, Ergocalciferol, (DRISDOL) 50000 UNITS CAPS. Pls call in to [facility] on [address]. Pt has a blood clot in leg and needs the Warfarin asap.

## 2012-03-21 NOTE — Telephone Encounter (Signed)
Jenny [name], I filled the lipitor and Vit D.

## 2012-03-25 ENCOUNTER — Ambulatory Visit (INDEPENDENT_AMBULATORY_CARE_PROVIDER_SITE_OTHER): Payer: 59 | Admitting: Family

## 2012-03-25 DIAGNOSIS — I82409 Acute embolism and thrombosis of unspecified deep veins of unspecified lower extremity: Secondary | ICD-10-CM

## 2012-03-25 LAB — POCT INR: INR: 2.6

## 2012-03-25 NOTE — Patient Instructions (Addendum)
Continue Coumadin at 8mg  all days except Monday, Wed, Friday 5mg . Recheck INR in 3 weeks.     Latest dosing instructions   Total Sun Mon Tue Wed Thu Fri Sat   47 5 mg 5 mg 5 mg 5 mg 5 mg 5 mg 5 mg    (5 mg1) (5 mg1) (5 mg1) (5 mg1) (5 mg1) (5 mg1) (5 mg1)    Addl Tabs  3 mg  3 mg  3 mg  3 mg    (3 mg1)  (3 mg1)  (3 mg1)  (3 mg1)

## 2012-04-13 ENCOUNTER — Ambulatory Visit (INDEPENDENT_AMBULATORY_CARE_PROVIDER_SITE_OTHER): Payer: 59 | Admitting: Family

## 2012-04-13 DIAGNOSIS — I82409 Acute embolism and thrombosis of unspecified deep veins of unspecified lower extremity: Secondary | ICD-10-CM

## 2012-04-13 NOTE — Patient Instructions (Addendum)
Continue Coumadin at 8mg  all days except Monday, Wed, Friday 5mg . Recheck INR in 4 weeks.     Latest dosing instructions   Total Sun Mon Tue Wed Thu Fri Sat   47 5 mg 5 mg 5 mg 5 mg 5 mg 5 mg 5 mg    (5 mg1) (5 mg1) (5 mg1) (5 mg1) (5 mg1) (5 mg1) (5 mg1)    Addl Tabs  3 mg  3 mg  3 mg  3 mg    (3 mg1)  (3 mg1)  (3 mg1)  (3 mg1)

## 2012-04-14 ENCOUNTER — Encounter: Payer: Self-pay | Admitting: Family Medicine

## 2012-04-14 ENCOUNTER — Ambulatory Visit (INDEPENDENT_AMBULATORY_CARE_PROVIDER_SITE_OTHER): Payer: 59 | Admitting: Family Medicine

## 2012-04-14 VITALS — BP 120/90 | HR 80 | Temp 97.2°F | Resp 12 | Wt 154.0 lb

## 2012-04-14 DIAGNOSIS — M79609 Pain in unspecified limb: Secondary | ICD-10-CM

## 2012-04-14 DIAGNOSIS — M79604 Pain in right leg: Secondary | ICD-10-CM

## 2012-04-14 NOTE — Progress Notes (Signed)
  Subjective:    Patient ID: Jenny [name], female    DOB: 20-Jan-1951, 61 y.o.   MRN: [id number]  HPI  History RLE DVT.  Sharp stabbing pain.  Onset 2 days ago Location- right lateral upper calf.  No back pain Associated-no numbness, weakness,or increased swelling.  No dyspnea or chest pain Duration-fleeting, about 10 seconds Intensity- 9/10 Exacerbating-none. Alleviating-none  ?recurrent DVT. Pt on chronic coumadin.  INR 2.5 yesterday  Past Medical History  Diagnosis Date  . DVT (deep venous thrombosis)     1 year ago  . Hypercholesteremia    Past Surgical History  Procedure Date  . Cesarean section     reports that she has been smoking Cigarettes.  She has a 19.5 pack-year smoking history. She does not have any smokeless tobacco history on file. She reports that she does not drink alcohol or use illicit drugs. family history includes Cancer in her father; Hypertension in her mother; and Stroke in her mother. No Known Allergies   Review of Systems  Constitutional: Negative for fever, appetite change and unexpected weight change.  Respiratory: Negative for cough and shortness of breath.   Cardiovascular: Negative for chest pain, palpitations and leg swelling.  Musculoskeletal: Negative for back pain.       Objective:   Physical Exam  Constitutional: She appears well-developed and well-nourished.  Cardiovascular: Normal rate and regular rhythm.  Exam reveals no gallop.   Pulmonary/Chest: Effort normal and breath sounds normal. No respiratory distress. She has no wheezes. She has no rales.  Musculoskeletal: She exhibits no edema.       No calf or leg edema. Minimal tenderness right lateral calf. No ecchymosis. No erythema. Full range of motion knee. No pain with plantar flexion dorsiflexion. Distal foot pulses normal.          Assessment & Plan:  Right leg pain. History of recurrent DVT. DVT clinically very unlikely with patient on Coumadin with therapeutic INR. This  almost sounds more neuropathic. Repeat venous Doppler right lower extremity. If negative and symptoms persist, consider trial of gabapentin

## 2012-04-14 NOTE — Patient Instructions (Addendum)
We will call you regarding venous doppler.

## 2012-04-15 ENCOUNTER — Encounter: Payer: 59 | Admitting: Family

## 2012-04-26 ENCOUNTER — Encounter (INDEPENDENT_AMBULATORY_CARE_PROVIDER_SITE_OTHER): Payer: 59

## 2012-04-26 DIAGNOSIS — M79604 Pain in right leg: Secondary | ICD-10-CM

## 2012-04-26 DIAGNOSIS — Z86718 Personal history of other venous thrombosis and embolism: Secondary | ICD-10-CM

## 2012-04-26 DIAGNOSIS — M79609 Pain in unspecified limb: Secondary | ICD-10-CM

## 2012-05-11 ENCOUNTER — Encounter: Payer: 59 | Admitting: Family

## 2012-05-11 DIAGNOSIS — Z0289 Encounter for other administrative examinations: Secondary | ICD-10-CM

## 2012-05-17 ENCOUNTER — Telehealth: Payer: Self-pay | Admitting: Family

## 2012-05-17 NOTE — Telephone Encounter (Signed)
Please call and schedule INR visit.

## 2012-05-17 NOTE — Telephone Encounter (Signed)
Appointment scheduled.

## 2012-05-19 ENCOUNTER — Ambulatory Visit (INDEPENDENT_AMBULATORY_CARE_PROVIDER_SITE_OTHER): Payer: 59 | Admitting: Family

## 2012-05-19 DIAGNOSIS — I82409 Acute embolism and thrombosis of unspecified deep veins of unspecified lower extremity: Secondary | ICD-10-CM

## 2012-05-19 NOTE — Patient Instructions (Addendum)
Continue Coumadin at 8mg  all days except Monday, Wed, Friday 5mg . Recheck INR in 6 weeks.     Latest dosing instructions   Total Sun Mon Tue Wed Thu Fri Sat   47 5 mg 5 mg 5 mg 5 mg 5 mg 5 mg 5 mg    (5 mg1) (5 mg1) (5 mg1) (5 mg1) (5 mg1) (5 mg1) (5 mg1)    Addl Tabs  3 mg  3 mg  3 mg  3 mg    (3 mg1)  (3 mg1)  (3 mg1)  (3 mg1)

## 2012-06-30 ENCOUNTER — Encounter: Payer: 59 | Admitting: Family

## 2012-07-07 ENCOUNTER — Telehealth: Payer: Self-pay | Admitting: Family

## 2012-07-07 NOTE — Telephone Encounter (Signed)
Appointment scheduled.

## 2012-07-07 NOTE — Telephone Encounter (Signed)
Needs a PT/INR visit asap.

## 2012-07-08 ENCOUNTER — Encounter: Payer: 59 | Admitting: Family

## 2012-07-18 ENCOUNTER — Other Ambulatory Visit: Payer: Self-pay | Admitting: Family

## 2012-07-20 ENCOUNTER — Telehealth: Payer: Self-pay | Admitting: Family Medicine

## 2012-07-20 NOTE — Telephone Encounter (Signed)
Pt called and stated that she was billed a 50$ no show fee 06/30/12. She stated that she rescheduled to 07/15/12 due to not being able to make it, because of weather. She stated that we then called her the morning of 07/15/12, and canceled her appt due to weather. She was supposed to receive a call on Monday morning to reschedule, and she never received that call. Please assist.

## 2012-07-26 ENCOUNTER — Telehealth: Payer: Self-pay | Admitting: Family

## 2012-07-26 NOTE — Telephone Encounter (Signed)
Please call and schedule INR appointment

## 2012-07-27 ENCOUNTER — Ambulatory Visit (INDEPENDENT_AMBULATORY_CARE_PROVIDER_SITE_OTHER): Payer: 59 | Admitting: Family

## 2012-07-27 DIAGNOSIS — I82409 Acute embolism and thrombosis of unspecified deep veins of unspecified lower extremity: Secondary | ICD-10-CM

## 2012-07-27 DIAGNOSIS — I82401 Acute embolism and thrombosis of unspecified deep veins of right lower extremity: Secondary | ICD-10-CM

## 2012-07-27 LAB — POCT INR: INR: 2.6

## 2012-07-27 NOTE — Telephone Encounter (Signed)
Done

## 2012-07-27 NOTE — Telephone Encounter (Signed)
Please notify pt request has been made for the no show charge to be removed from her account.  She can disregard the bill associated with the no show charge.  Also also assist patient with making following up appointment that was cancelled due to inclement weather.

## 2012-07-27 NOTE — Patient Instructions (Signed)
Continue Coumadin at 8mg  all days except Monday, Wed, Friday 5mg . Recheck INR in 6 weeks.  Anticoagulation Dose Instructions as of 07/27/2012     Jenny [name] Tue Wed Thu Fri Sat   New Dose 8 mg 5 mg 8 mg 5 mg 8 mg 5 mg 8 mg    Description       Continue Coumadin at 8mg  all days except Monday, Wed, Friday 5mg . Recheck INR in 6 weeks.

## 2012-08-01 ENCOUNTER — Other Ambulatory Visit: Payer: Self-pay | Admitting: Family

## 2012-09-07 ENCOUNTER — Encounter: Payer: 59 | Admitting: Family

## 2012-09-14 ENCOUNTER — Encounter: Payer: 59 | Admitting: Family

## 2012-09-15 ENCOUNTER — Ambulatory Visit (INDEPENDENT_AMBULATORY_CARE_PROVIDER_SITE_OTHER): Payer: 59 | Admitting: Family

## 2012-09-15 DIAGNOSIS — I82409 Acute embolism and thrombosis of unspecified deep veins of unspecified lower extremity: Secondary | ICD-10-CM

## 2012-09-15 DIAGNOSIS — I82401 Acute embolism and thrombosis of unspecified deep veins of right lower extremity: Secondary | ICD-10-CM

## 2012-09-15 LAB — POCT INR: INR: 2.2

## 2012-09-15 NOTE — Patient Instructions (Addendum)
Continue Coumadin at 8mg  all days except Monday, Wed, Friday 5mg . Recheck INR in 6 weeks.   Anticoagulation Dose Instructions as of 09/15/2012     Glynis Smiles Tue Wed Thu Fri Sat   New Dose 8 mg 5 mg 8 mg 5 mg 8 mg 5 mg 8 mg    Description       Continue Coumadin at 8mg  all days except Monday, Wed, Friday 5mg . Recheck INR in 6 weeks.

## 2012-10-02 ENCOUNTER — Other Ambulatory Visit: Payer: Self-pay | Admitting: Family

## 2012-11-01 ENCOUNTER — Encounter: Payer: 59 | Admitting: Family

## 2012-11-02 ENCOUNTER — Ambulatory Visit (INDEPENDENT_AMBULATORY_CARE_PROVIDER_SITE_OTHER): Payer: 59 | Admitting: Family

## 2012-11-02 DIAGNOSIS — I82401 Acute embolism and thrombosis of unspecified deep veins of right lower extremity: Secondary | ICD-10-CM

## 2012-11-02 DIAGNOSIS — I82409 Acute embolism and thrombosis of unspecified deep veins of unspecified lower extremity: Secondary | ICD-10-CM

## 2012-11-02 NOTE — Patient Instructions (Addendum)
Hold Coumadin today. Continue Coumadin at 8mg  all days except Monday, Wed, Friday 5mg . Recheck INR in 4 weeks. Anticoagulation Dose Instructions as of 11/02/2012     Glynis Smiles Tue Wed Thu Fri Sat   New Dose 8 mg 5 mg 8 mg 5 mg 8 mg 5 mg 8 mg    Description       Hold Coumadin today. Continue Coumadin at 8mg  all days except Monday, Wed, Friday 5mg . Recheck INR in 4 weeks.

## 2012-11-03 ENCOUNTER — Other Ambulatory Visit: Payer: Self-pay

## 2012-11-03 MED ORDER — WARFARIN SODIUM 5 MG PO TABS: ORAL_TABLET | ORAL | Status: DC

## 2012-12-08 ENCOUNTER — Ambulatory Visit: Payer: 59

## 2012-12-29 ENCOUNTER — Other Ambulatory Visit: Payer: Self-pay | Admitting: Family Medicine

## 2013-01-01 ENCOUNTER — Other Ambulatory Visit: Payer: Self-pay | Admitting: Family Medicine

## 2013-01-23 ENCOUNTER — Ambulatory Visit (INDEPENDENT_AMBULATORY_CARE_PROVIDER_SITE_OTHER): Payer: 59 | Admitting: Family Medicine

## 2013-01-23 ENCOUNTER — Ambulatory Visit (INDEPENDENT_AMBULATORY_CARE_PROVIDER_SITE_OTHER): Payer: 59 | Admitting: General Practice

## 2013-01-23 ENCOUNTER — Encounter: Payer: Self-pay | Admitting: Family Medicine

## 2013-01-23 VITALS — BP 120/78 | HR 87 | Temp 98.3°F | Ht 64.0 in | Wt 153.0 lb

## 2013-01-23 DIAGNOSIS — R079 Chest pain, unspecified: Secondary | ICD-10-CM

## 2013-01-23 DIAGNOSIS — I82409 Acute embolism and thrombosis of unspecified deep veins of unspecified lower extremity: Secondary | ICD-10-CM

## 2013-01-23 DIAGNOSIS — Z7901 Long term (current) use of anticoagulants: Secondary | ICD-10-CM

## 2013-01-23 DIAGNOSIS — R05 Cough: Secondary | ICD-10-CM

## 2013-01-23 DIAGNOSIS — R1011 Right upper quadrant pain: Secondary | ICD-10-CM

## 2013-01-23 LAB — POCT INR: INR: 3

## 2013-01-23 NOTE — Progress Notes (Signed)
  Subjective:    Patient ID: Jenny [name], female    DOB: 1950/05/27, 62 y.o.   MRN: [id number]  HPI Patient seen as a work in with episode of transient chest pain last week at rest. This occurred on either Tuesday or Wednesday-she cannot recall which. She was talking on the phone and noticed about 3-4 minute episode of squeezing pressure substernally. Denies any dyspnea. No radiation of pain. She felt slightly nauseous but no vomiting. Denies any recent exertional pains. She does not do any regular exercise.  Patient smokes about one pack cigarettes per day. No family history of CAD.  She also has history of dyslipidemia with low HDL. She is dealing with some stress issues -her son who was recently incarcerated.  Patient has history of dyslipidemia and prior history of recurrent DVT. No history of CAD.  Patient also describes what she thinks is a separate pain off and on for weeks. This is transient pain underneath her right breast that radiates to the back. No associated nausea or vomiting. No clear exacerbating factors. No alleviating factors. Not related to food.  Past Medical History  Diagnosis Date  . DVT (deep venous thrombosis)     1 year ago  . Hypercholesteremia    Past Surgical History  Procedure Laterality Date  . Cesarean section      reports that she has been smoking Cigarettes.  She has a 19.5 pack-year smoking history. She does not have any smokeless tobacco history on file. She reports that she does not drink alcohol or use illicit drugs. family history includes Cancer in her father; Hypertension in her mother; Stroke in her mother. No Known Allergies    Review of Systems  Constitutional: Negative for chills, fatigue and unexpected weight change.  Eyes: Negative for visual disturbance.  Respiratory: Negative for cough, chest tightness, shortness of breath and wheezing.   Cardiovascular: Positive for chest pain. Negative for palpitations and leg swelling.   Neurological: Negative for dizziness, seizures, syncope, weakness, light-headedness and headaches.       Objective:   Physical Exam  Constitutional: She appears well-developed and well-nourished.  Neck: Neck supple. No thyromegaly present.  Cardiovascular: Normal rate and regular rhythm.  Exam reveals no gallop.   Pulmonary/Chest: Effort normal and breath sounds normal. No respiratory distress. She has no wheezes. She has no rales.  Abdominal: Soft. She exhibits no mass. There is no tenderness.  Musculoskeletal: She exhibits no edema.  Skin: No rash noted.          Assessment & Plan:  Chest pain at rest. Patient has risk factors including smoking status and history of low HDL. Her worrisome symptoms included equality of pain and associated nausea. Obtain EKG. Consider further cardiac evaluation with stress testing. EKG NSR with  No acute changes.     Intermittent right upper quadrant pain. Consider abdominal ultrasound to further assess. Will proceed with cardiac evaluation first.

## 2013-01-30 ENCOUNTER — Other Ambulatory Visit: Payer: Self-pay | Admitting: Family Medicine

## 2013-01-31 ENCOUNTER — Other Ambulatory Visit: Payer: Self-pay | Admitting: Family

## 2013-02-08 ENCOUNTER — Ambulatory Visit (HOSPITAL_COMMUNITY): Payer: 59 | Attending: Cardiology | Admitting: Radiology

## 2013-02-08 VITALS — BP 106/70 | HR 74 | Ht 65.0 in | Wt 150.0 lb

## 2013-02-08 DIAGNOSIS — R110 Nausea: Secondary | ICD-10-CM | POA: Insufficient documentation

## 2013-02-08 DIAGNOSIS — R0789 Other chest pain: Secondary | ICD-10-CM | POA: Insufficient documentation

## 2013-02-08 DIAGNOSIS — E785 Hyperlipidemia, unspecified: Secondary | ICD-10-CM | POA: Insufficient documentation

## 2013-02-08 DIAGNOSIS — R0602 Shortness of breath: Secondary | ICD-10-CM | POA: Insufficient documentation

## 2013-02-08 DIAGNOSIS — R61 Generalized hyperhidrosis: Secondary | ICD-10-CM | POA: Insufficient documentation

## 2013-02-08 DIAGNOSIS — R079 Chest pain, unspecified: Secondary | ICD-10-CM | POA: Insufficient documentation

## 2013-02-08 DIAGNOSIS — F458 Other somatoform disorders: Secondary | ICD-10-CM | POA: Insufficient documentation

## 2013-02-08 DIAGNOSIS — R0609 Other forms of dyspnea: Secondary | ICD-10-CM | POA: Insufficient documentation

## 2013-02-08 DIAGNOSIS — R0989 Other specified symptoms and signs involving the circulatory and respiratory systems: Secondary | ICD-10-CM | POA: Insufficient documentation

## 2013-02-08 DIAGNOSIS — R5381 Other malaise: Secondary | ICD-10-CM | POA: Insufficient documentation

## 2013-02-08 DIAGNOSIS — R42 Dizziness and giddiness: Secondary | ICD-10-CM | POA: Insufficient documentation

## 2013-02-08 MED ORDER — AMINOPHYLLINE 25 MG/ML IV SOLN
75.0000 mg | Freq: Once | INTRAVENOUS | Status: AC
  Administered 2013-02-08: 75 mg via INTRAVENOUS

## 2013-02-08 MED ORDER — REGADENOSON 0.4 MG/5ML IV SOLN
0.4000 mg | Freq: Once | INTRAVENOUS | Status: AC
  Administered 2013-02-08: 0.4 mg via INTRAVENOUS

## 2013-02-08 MED ORDER — TECHNETIUM TC 99M SESTAMIBI GENERIC - CARDIOLITE
11.0000 | Freq: Once | INTRAVENOUS | Status: AC | PRN
  Administered 2013-02-08: 11 via INTRAVENOUS

## 2013-02-08 MED ORDER — TECHNETIUM TC 99M SESTAMIBI GENERIC - CARDIOLITE
33.0000 | Freq: Once | INTRAVENOUS | Status: AC | PRN
  Administered 2013-02-08: 33 via INTRAVENOUS

## 2013-02-08 NOTE — Progress Notes (Signed)
[facility] SITE 3 NUCLEAR MED [address] [place], [place] [address] [phone]    Cardiology Nuclear Med Study  Jenny [name] is a 62 y.o. [name]     MRN : [id number]     DOB: Sep 29, 1950  Procedure Date: 02/08/2013  Nuclear Med Background Indication for Stress Test:  Evaluation for Ischemia History:  No prior known history of CAD Cardiac Risk Factors: Lipids and Smoker  Symptoms:  Chest Pain, Chest Pressure.  (last date of chest discomfort was two weeks ago), Diaphoresis, DOE, Fatigue, Light-Headedness, Nausea, Rapid HR and SOB   Nuclear Pre-Procedure Caffeine/Decaff Intake:  None > 12 hrs NPO After: 6:00am   Lungs:  clear O2 Sat: 95% on room air. IV 0.9% NS with Angio Cath:  22g  IV Site: R Antecubital  IV Started by:  Dario Guardian, CNMT  Chest Size (in):  40 Cup Size: D  Height: 5\' 5"  (1.651 m)  Weight:  150 lb (68.04 kg)  BMI:  Body mass index is 24.96 kg/(m^2). Tech Comments:  n/a    Nuclear Med Study 1 or 2 day study: 1 day  Stress Test Type:  Lexiscan  Reading MD: Willa Rough, MD  Order Authorizing Provider:  Evelena Peat, MD  Resting Radionuclide: Technetium 20m Sestamibi  Resting Radionuclide Dose: 11.0 mCi   Stress Radionuclide:  Technetium 93m Sestamibi  Stress Radionuclide Dose: 33.0 mCi           Stress Protocol Rest HR: 74 Stress HR: 109  Rest BP: 106/70 Stress BP: 113/91  Exercise Time (min): n/a METS: n/a           Dose of Adenosine (mg):  n/a Dose of Lexiscan: 0.4 mg  Dose of Atropine (mg): n/a Dose of Dobutamine: n/a mcg/kg/min (at max HR)  Stress Test Technologist: Nelson Chimes, BS-ES  Nuclear Technologist:  Domenic Polite, CNMT     Rest Procedure:  Myocardial perfusion imaging was performed at rest 45 minutes following the intravenous administration of Technetium 74m Sestamibi. Rest ECG: NSR - Normal EKG  Stress Procedure:  The patient received IV Lexiscan 0.4 mg over 15-seconds.  Technetium 79m Sestamibi injected at  30-seconds.  During the infusion, patient stated she felt weird, complained of nausea, headache, lower back and side pain. 75 mg aminophylline was administered after patient became very nauseated and had a severe headache. BP dropped to 86/70 at which time she stated she was lightheaded.  The patient was then reclined and legs elevated.  BP came back up to 106/52. Patients symptoms resolved quickly.  Quantitative spect images were obtained after a 45 minute delay.  Stress ECG: No significant change from baseline ECG  QPS Raw Data Images:  Normal; no motion artifact; normal heart/lung ratio. Stress Images:  Small/moderate area of mild decreased activity at the mid/apical anterior wall and mid anteroseptal wall. This is fixed. Rest Images:  The rest images are similar to the stress images. Subtraction (SDS):  No evidence of ischemia. Transient Ischemic Dilatation (Normal <1.22):  n/a Lung/Heart Ratio (Normal <0.45):  0.43  Quantitative Gated Spect Images QGS EDV:  56 ml QGS ESV:  14 ml  Impression Exercise Capacity:  Lexiscan with no exercise. BP Response:  Normal blood pressure response. Clinical Symptoms:  Patient had sudden marked nausea related to the infusion. She was given Aminophyllin with rapid reversal of his symptoms. ECG Impression:  No significant ST segment change suggestive of ischemia. Comparison with Prior Nuclear Study: No images to compare  Overall Impression:  Normal stress nuclear study. This is a low risk scan. There is breast attenuation. There are no diagnostic abnormalities.  LV Ejection Fraction: 75%.  LV Wall Motion:  Normal Wall Motion .  Willa Rough, MD

## 2013-02-09 NOTE — Addendum Note (Signed)
Addended by: Kristian Covey on: 02/09/2013 05:18 PM   Modules accepted: Orders

## 2013-02-10 ENCOUNTER — Ambulatory Visit (INDEPENDENT_AMBULATORY_CARE_PROVIDER_SITE_OTHER)
Admission: RE | Admit: 2013-02-10 | Discharge: 2013-02-10 | Disposition: A | Discharge status: Home / Self Care | Payer: 59 | Source: Ambulatory Visit | Attending: Family Medicine | Admitting: Family Medicine

## 2013-02-10 DIAGNOSIS — R079 Chest pain, unspecified: Secondary | ICD-10-CM

## 2013-02-10 DIAGNOSIS — R05 Cough: Secondary | ICD-10-CM

## 2013-02-17 ENCOUNTER — Ambulatory Visit (INDEPENDENT_AMBULATORY_CARE_PROVIDER_SITE_OTHER): Payer: 59 | Admitting: Family Medicine

## 2013-02-17 DIAGNOSIS — Z111 Encounter for screening for respiratory tuberculosis: Secondary | ICD-10-CM

## 2013-02-20 ENCOUNTER — Ambulatory Visit: Payer: 59

## 2013-02-20 LAB — TB SKIN TEST
Induration: 0 mm
TB Skin Test: NEGATIVE

## 2013-02-27 ENCOUNTER — Other Ambulatory Visit: Payer: Self-pay | Admitting: Family

## 2013-02-27 ENCOUNTER — Ambulatory Visit: Payer: 59

## 2013-03-02 ENCOUNTER — Ambulatory Visit: Payer: 59

## 2013-03-09 ENCOUNTER — Ambulatory Visit: Payer: 59

## 2013-03-10 ENCOUNTER — Ambulatory Visit (INDEPENDENT_AMBULATORY_CARE_PROVIDER_SITE_OTHER): Payer: 59 | Admitting: Family

## 2013-03-10 DIAGNOSIS — Z7901 Long term (current) use of anticoagulants: Secondary | ICD-10-CM

## 2013-03-10 LAB — POCT INR: INR: 2.5

## 2013-03-10 NOTE — Patient Instructions (Signed)
Continue to take 8mg  all days except take 5 mg on M/W/F and re-check in 4 weeks.  Anticoagulation Dose Instructions as of 03/10/2013     Glynis Smiles Tue Wed Thu Fri Sat   New Dose 8 mg 5 mg 8 mg 5 mg 8 mg 5 mg 8 mg    Description       Continue to take 8mg  all days except take 5 mg on M/W/F and re-check in 4 weeks.

## 2013-05-01 ENCOUNTER — Encounter: Payer: Self-pay | Admitting: Nurse Practitioner

## 2013-05-01 ENCOUNTER — Ambulatory Visit (INDEPENDENT_AMBULATORY_CARE_PROVIDER_SITE_OTHER): Payer: BC Managed Care – PPO | Admitting: Nurse Practitioner

## 2013-05-01 VITALS — BP 92/60 | HR 101 | Temp 98.2°F | Ht 64.0 in | Wt 149.5 lb

## 2013-05-01 DIAGNOSIS — J019 Acute sinusitis, unspecified: Secondary | ICD-10-CM

## 2013-05-01 DIAGNOSIS — R05 Cough: Secondary | ICD-10-CM

## 2013-05-01 MED ORDER — GUAIFENESIN-CODEINE 300-10 MG/5ML PO LIQD: 5.0000 mL | Freq: Every evening | ORAL | Status: DC | PRN

## 2013-05-01 MED ORDER — AMOXICILLIN-POT CLAVULANATE 875-125 MG PO TABS: 1.0000 | ORAL_TABLET | Freq: Two times a day (BID) | ORAL | Status: DC

## 2013-05-01 MED ORDER — BENZONATATE 100 MG PO CAPS: ORAL_CAPSULE | ORAL | Status: DC

## 2013-05-01 NOTE — Progress Notes (Signed)
   Subjective:    Patient ID: Jenny [name], female    DOB: 03/30/1951, 62 y.o.   MRN: [id number]  URI  This is a chronic problem. The current episode started 1 to 4 weeks ago (2 wks). The problem has been gradually worsening. There has been no fever. Associated symptoms include congestion, coughing, ear pain (fullness), headaches, a plugged ear sensation, sinus pain and a sore throat. Pertinent negatives include no abdominal pain, chest pain, diarrhea, joint pain, nausea, neck pain, swollen glands, vomiting or wheezing. Associated symptoms comments: R posterior back pain when coughs since last night-thinks pulled muscle while coughing hard. Treatments tried: nyquil. The treatment provided no relief.      Review of Systems  Constitutional: Positive for fatigue. Negative for fever, chills, activity change and appetite change.  HENT: Positive for congestion, ear pain (fullness), postnasal drip, sinus pressure and sore throat.   Respiratory: Positive for cough. Negative for chest tightness, shortness of breath and wheezing.   Cardiovascular: Negative for chest pain.  Gastrointestinal: Negative for nausea, vomiting, abdominal pain and diarrhea.  Musculoskeletal: Positive for back pain (pain when coughs R posterior chest. no pain w/inspiration.). Negative for joint pain and neck pain.  Neurological: Positive for headaches.  Hematological: Negative for adenopathy.       Objective:   Physical Exam  Vitals reviewed. Constitutional: She is oriented to person, place, and time. She appears well-developed and well-nourished. No distress.  HENT:  Head: Normocephalic and atraumatic.  Right Ear: External ear normal.  Left Ear: External ear normal.  Mouth/Throat: No oropharyngeal exudate.  Erythematous posterior pharynx  Eyes: Conjunctivae are normal. Right eye exhibits no discharge. Left eye exhibits no discharge.  Neck: Normal range of motion. Neck supple. No thyromegaly present.  Cardiovascular:  Normal rate and regular rhythm.   No murmur heard. Pulmonary/Chest: Effort normal and breath sounds normal. No respiratory distress. She has no wheezes.  Frequent coughing during exam   Lymphadenopathy:    She has no cervical adenopathy.  Neurological: She is alert and oriented to person, place, and time.  Skin: Skin is warm and dry.  Psychiatric: She has a normal mood and affect. Her behavior is normal. Thought content normal.          Assessment & Plan:  1. Acute sinusitis Duration 2 weeks - amoxicillin-clavulanate (AUGMENTIN) 875-125 MG per tablet; Take 1 tablet by mouth 2 (two) times daily.  Dispense: 10 tablet; Refill: 0  2. Cough - benzonatate (TESSALON) 100 MG capsule; Take 1-2 capsules po up to 3 times daily PRN cough  Dispense: 60 capsule; Refill: 0 - Guaifenesin-Codeine 300-10 MG/5ML LIQD; Take 5 mLs by mouth at bedtime as needed.  Dispense: 60 mL; Refill: 0  See pt instructions.

## 2013-05-01 NOTE — Patient Instructions (Signed)
Start antibiotic. Start daily sinus rinses (Neilmed Sinus rinse). You may use pseudoephedrine 30 mg twice daily for 4-6 days. For cough, use cough syrup at night time & capsules during day. Please call for re-evaluation if you are not improving.  Sinusitis Sinusitis is redness, soreness, and swelling (inflammation) of the paranasal sinuses. Paranasal sinuses are air pockets within the bones of your face (beneath the eyes, the middle of the forehead, or above the eyes). In healthy paranasal sinuses, mucus is able to drain out, and air is able to circulate through them by way of your nose. However, when your paranasal sinuses are inflamed, mucus and air can become trapped. This can allow bacteria and other germs to grow and cause infection. Sinusitis can develop quickly and last only a short time (acute) or continue over a long period (chronic). Sinusitis that lasts for more than 12 weeks is considered chronic.  CAUSES  Causes of sinusitis include:  Allergies.  Structural abnormalities, such as displacement of the cartilage that separates your nostrils (deviated septum), which can decrease the air flow through your nose and sinuses and affect sinus drainage.  Functional abnormalities, such as when the small hairs (cilia) that line your sinuses and help remove mucus do not work properly or are not present. SYMPTOMS  Symptoms of acute and chronic sinusitis are the same. The primary symptoms are pain and pressure around the affected sinuses. Other symptoms include:  Upper toothache.  Earache.  Headache.  Bad breath.  Decreased sense of smell and taste.  A cough, which worsens when you are lying flat.  Fatigue.  Fever.  Thick drainage from your nose, which often is green and may contain pus (purulent).  Swelling and warmth over the affected sinuses. DIAGNOSIS  Your caregiver will perform a physical exam. During the exam, your caregiver may:  Look in your nose for signs of abnormal  growths in your nostrils (nasal polyps).  Tap over the affected sinus to check for signs of infection.  View the inside of your sinuses (endoscopy) with a special imaging device with a light attached (endoscope), which is inserted into your sinuses. If your caregiver suspects that you have chronic sinusitis, one or more of the following tests may be recommended:  Allergy tests.  Nasal culture A sample of mucus is taken from your nose and sent to a lab and screened for bacteria.  Nasal cytology A sample of mucus is taken from your nose and examined by your caregiver to determine if your sinusitis is related to an allergy. TREATMENT  Most cases of acute sinusitis are related to a viral infection and will resolve on their own within 10 days. Sometimes medicines are prescribed to help relieve symptoms (pain medicine, decongestants, nasal steroid sprays, or saline sprays).  However, for sinusitis related to a bacterial infection, your caregiver will prescribe antibiotic medicines. These are medicines that will help kill the bacteria causing the infection.  Rarely, sinusitis is caused by a fungal infection. In theses cases, your caregiver will prescribe antifungal medicine. For some cases of chronic sinusitis, surgery is needed. Generally, these are cases in which sinusitis recurs more than 3 times per year, despite other treatments. HOME CARE INSTRUCTIONS   Drink plenty of water. Water helps thin the mucus so your sinuses can drain more easily.  Use a humidifier.  Inhale steam 3 to 4 times a day (for example, sit in the bathroom with the shower running).  Apply a warm, moist washcloth to your face 3 to  4 times a day, or as directed by your caregiver.  Use saline nasal sprays to help moisten and clean your sinuses.  Take over-the-counter or prescription medicines for pain, discomfort, or fever only as directed by your caregiver. SEEK IMMEDIATE MEDICAL CARE IF:  You have increasing pain or  severe headaches.  You have nausea, vomiting, or drowsiness.  You have swelling around your face.  You have vision problems.  You have a stiff neck.  You have difficulty breathing. MAKE SURE YOU:   Understand these instructions.  Will watch your condition.  Will get help right away if you are not doing well or get worse. Document Released: 04/20/2005 Document Revised: 07/13/2011 Document Reviewed: 05/05/2011 [facility] Patient Information 2014 [place], [place].

## 2013-05-01 NOTE — Progress Notes (Signed)
Pre-visit discussion using our clinic review tool. No additional management support is needed unless otherwise documented below in the visit note.  

## 2013-06-03 ENCOUNTER — Other Ambulatory Visit: Payer: Self-pay | Admitting: Family Medicine

## 2013-06-03 ENCOUNTER — Other Ambulatory Visit: Payer: Self-pay | Admitting: Family

## 2013-06-05 ENCOUNTER — Other Ambulatory Visit: Payer: Self-pay | Admitting: General Practice

## 2013-06-05 MED ORDER — WARFARIN SODIUM 3 MG PO TABS: ORAL_TABLET | ORAL | Status: DC

## 2013-07-05 ENCOUNTER — Other Ambulatory Visit: Payer: Self-pay | Admitting: Family

## 2013-07-18 ENCOUNTER — Telehealth: Payer: Self-pay | Admitting: General Practice

## 2013-07-18 NOTE — Telephone Encounter (Signed)
Left message with female in the home to have pt call office.

## 2013-08-08 ENCOUNTER — Telehealth: Payer: Self-pay

## 2013-08-08 NOTE — Telephone Encounter (Signed)
Dr. Alvis Lemmings called and he wants to know what are your recommendations for the patient. Pt is suppose to have a procedure done, Dr. Alvis Lemmings wants to know how long she should stay off of coumadin. Dr. Alvis Lemmings stated that you can either call him or fax a letter over.  Ph: [phone] Fax: [phone]

## 2013-08-09 NOTE — Telephone Encounter (Signed)
She could hold coumadin for 5 days prior to procedure but should start back as soon as possible following her procedure.

## 2013-08-09 NOTE — Telephone Encounter (Signed)
Faxed letter to Dr. Alvis Lemmings office.

## 2013-08-29 ENCOUNTER — Other Ambulatory Visit: Payer: Self-pay | Admitting: Family Medicine

## 2013-09-10 ENCOUNTER — Other Ambulatory Visit: Payer: Self-pay | Admitting: Family Medicine

## 2013-10-04 ENCOUNTER — Ambulatory Visit (INDEPENDENT_AMBULATORY_CARE_PROVIDER_SITE_OTHER): Payer: BC Managed Care – PPO | Admitting: General Practice

## 2013-10-05 ENCOUNTER — Ambulatory Visit (INDEPENDENT_AMBULATORY_CARE_PROVIDER_SITE_OTHER): Payer: BC Managed Care – PPO | Admitting: General Practice

## 2013-10-05 ENCOUNTER — Telehealth: Payer: Self-pay | Admitting: Family Medicine

## 2013-10-05 DIAGNOSIS — Z5181 Encounter for therapeutic drug level monitoring: Secondary | ICD-10-CM | POA: Insufficient documentation

## 2013-10-05 LAB — POCT INR: INR: 1.2

## 2013-10-05 NOTE — Progress Notes (Signed)
Pre visit review using our clinic review tool, if applicable. No additional management support is needed unless otherwise documented below in the visit note. 

## 2013-10-05 NOTE — Telephone Encounter (Signed)
Patient Information:  Caller Name: Jenny [name]  Phone: [phone]  Patient: Jenny [name], Jenny [name]  Gender: Female  DOB: 07/21/1950  Age: 63 Years  PCP: Carolann Littler [facility])  Office Follow Up:  Does the office need to follow up with this patient?: Yes  Instructions For The Office: Please follow up with patient regarding possible appointment with PCP 10/05/13 related to right calf pain.  History of DVT/ she was off Warfarin for 5 days prior to oral surgery on 10/03/13.  She was transferred to office per her request to schedule appointment in Coumadin clinic.   Symptoms  Reason For Call & Symptoms: Pain in right leg; history of DVT right leg estimated 2012.  She was off Warfarin x 5 days prior to oral surgery 10/03/13; resumed medication on 10/03/13.  On 10/05/13 she reports pain in right leg with 3 second duration, rated at 3 of 10.  Reviewed Health History In EMR: Yes  Reviewed Medications In EMR: Yes  Reviewed Allergies In EMR: Yes  Reviewed Surgeries / Procedures: Yes  Date of Onset of Symptoms: 10/05/2013  Guideline(s) Used:  Leg Pain  Disposition Per Guideline:   Go to ED Now (or to Office with PCP Approval)  Reason For Disposition Reached:   History of prior "blood clot" in leg or lungs (i.e., deep vein thrombosis, pulmonary embolism)  Advice Given:  Call Back If:  You become worse.  RN Overrode Recommendation:  Document Patient  Patient states she does not want to go to ED.  Please check to see if she can see PCP Today instead of ED.  Caller requested to be connected with office regarding coming in for PT/INR/ Coumadin Clinic.  She was transferred to office.

## 2013-10-08 ENCOUNTER — Other Ambulatory Visit: Payer: Self-pay | Admitting: Family Medicine

## 2013-10-09 ENCOUNTER — Other Ambulatory Visit: Payer: Self-pay | Admitting: General Practice

## 2013-10-09 MED ORDER — WARFARIN SODIUM 3 MG PO TABS: ORAL_TABLET | ORAL | Status: DC

## 2013-10-12 ENCOUNTER — Ambulatory Visit (INDEPENDENT_AMBULATORY_CARE_PROVIDER_SITE_OTHER): Payer: BC Managed Care – PPO | Admitting: General Practice

## 2013-10-12 DIAGNOSIS — Z5181 Encounter for therapeutic drug level monitoring: Secondary | ICD-10-CM

## 2013-10-12 DIAGNOSIS — I82401 Acute embolism and thrombosis of unspecified deep veins of right lower extremity: Secondary | ICD-10-CM

## 2013-10-12 DIAGNOSIS — I82409 Acute embolism and thrombosis of unspecified deep veins of unspecified lower extremity: Secondary | ICD-10-CM

## 2013-10-12 LAB — POCT INR: INR: 2

## 2013-10-12 NOTE — Progress Notes (Signed)
Pre visit review using our clinic review tool, if applicable. No additional management support is needed unless otherwise documented below in the visit note. 

## 2013-10-26 ENCOUNTER — Ambulatory Visit (INDEPENDENT_AMBULATORY_CARE_PROVIDER_SITE_OTHER): Payer: BC Managed Care – PPO | Admitting: General Practice

## 2013-10-26 DIAGNOSIS — Z5181 Encounter for therapeutic drug level monitoring: Secondary | ICD-10-CM

## 2013-10-26 LAB — POCT INR: INR: 2.8

## 2013-10-26 NOTE — Progress Notes (Signed)
Pre visit review using our clinic review tool, if applicable. No additional management support is needed unless otherwise documented below in the visit note. 

## 2013-10-27 ENCOUNTER — Ambulatory Visit (INDEPENDENT_AMBULATORY_CARE_PROVIDER_SITE_OTHER): Payer: BC Managed Care – PPO | Admitting: Internal Medicine

## 2013-10-27 ENCOUNTER — Encounter: Payer: Self-pay | Admitting: Internal Medicine

## 2013-10-27 VITALS — BP 118/90 | HR 96 | Temp 99.1°F | Wt 145.0 lb

## 2013-10-27 DIAGNOSIS — J018 Other acute sinusitis: Secondary | ICD-10-CM

## 2013-10-27 DIAGNOSIS — R059 Cough, unspecified: Secondary | ICD-10-CM | POA: Insufficient documentation

## 2013-10-27 DIAGNOSIS — Z7901 Long term (current) use of anticoagulants: Secondary | ICD-10-CM | POA: Insufficient documentation

## 2013-10-27 DIAGNOSIS — R05 Cough: Secondary | ICD-10-CM

## 2013-10-27 DIAGNOSIS — F17200 Nicotine dependence, unspecified, uncomplicated: Secondary | ICD-10-CM

## 2013-10-27 MED ORDER — DOXYCYCLINE HYCLATE 100 MG PO CAPS
100.0000 mg | ORAL_CAPSULE | Freq: Two times a day (BID) | ORAL | Status: DC
Start: 2013-10-27 — End: 2013-11-01

## 2013-10-27 MED ORDER — HYDROCODONE-HOMATROPINE 5-1.5 MG/5ML PO SYRP: 5.0000 mL | ORAL_SOLUTION | ORAL | Status: DC | PRN

## 2013-10-27 MED ORDER — PREDNISONE 20 MG PO TABS: ORAL_TABLET | ORAL | Status: DC

## 2013-10-27 NOTE — Patient Instructions (Addendum)
Cough syrup.  At night  `ok for comfort  mucinex may help loosen the cough  Stop tobacco as you are doing Take prednisone for poss  Wheezing bronchitis sinuitis   Add antibiotic cif drainage becomes  Thick yellow green etc  . Check with  Coumadin team as cabn effect INR contact on call team if fever shortness of breath  Other concerns or not getting better

## 2013-10-27 NOTE — Progress Notes (Signed)
Chief Complaint  Patient presents with  . Hoarse    Started 2 wks ago.  Chest and nasal congestion are white and thick.  . Chest Congestion  . Nasal Congestion  . Cough  . Sore Throat  . Post Nasal Drip    HPI: Patient comes in today for SDA for  new problem evaluation. PCP is dr B Has been having problems for about 2 weeks Oral surgery on front tooth 3 weeks ago .  Got sore throat after tha and had antibiotic she believes amoxicillin for 2 weeks before the surgery ated toasted bagel.  And had pain one side of throat and felt like scratches  And had blood when gargles., and now still has some sore throat hoarseness and now head congestion facial pressure  And ears  Pressure .  Cough laying down  stpitting white foamy . Sometimes wheezy no hemoptysis No fever   No meds no asthma  chronic smoker Cutting back 1/2ppd  .  Some wheezing and back  Tend to wheeze  In past.  amox  875 for about  2.5 weeks  Tooth infection  ROS: See pertinent positives and negatives per HPI. No chest pain shortness of breath otherwise on anticoagulation for recurrent DVT he have allergy  Past Medical History  Diagnosis Date  . DVT (deep venous thrombosis)     1 year ago  . Hypercholesteremia     Family History  Problem Relation Age of Onset  . Stroke Mother   . Hypertension Mother   . Cancer Father     colon    History   Social History  . Marital Status: Married    Spouse Name: N/A    Number of Children: N/A  . Years of Education: N/A   Social History Main Topics  . Smoking status: Current Every Day Smoker -- 0.50 packs/day for 39 years    Types: Cigarettes  . Smokeless tobacco: None  . Alcohol Use: No  . Drug Use: No  . Sexual Activity: No   Other Topics Concern  . None   Social History Narrative  . None    Outpatient Encounter Prescriptions as of 10/27/2013  Medication Sig  . atorvastatin (LIPITOR) 10 MG tablet TAKE 1 TABLET BY MOUTH EVERY DAY  . Vitamin D, Ergocalciferol,  (DRISDOL) 50000 UNITS CAPS capsule TAKE ONE CAPSULE BY MOUTH ONCE A WEEK ON THURSDAYS  . warfarin (COUMADIN) 3 MG tablet Take as directed by anticoagulation clinic  . warfarin (COUMADIN) 5 MG tablet TAKE 1 TABLET BY MOUTH EVERY DAY (TAKE WITH 3 MG WARFARIN)  . doxycycline (VIBRAMYCIN) 100 MG capsule Take 1 capsule (100 mg total) by mouth 2 (two) times daily.  Marland Kitchen HYDROcodone-homatropine (HYCODAN) 5-1.5 MG/5ML syrup Take 5 mLs by mouth every 4 (four) hours as needed for cough. 1-2 tsp  . predniSONE (DELTASONE) 20 MG tablet Take 3 po qd for 2 days then 2 po qd for 3 days,or as directed  . [DISCONTINUED] amoxicillin-clavulanate (AUGMENTIN) 875-125 MG per tablet Take 1 tablet by mouth 2 (two) times daily.  . [DISCONTINUED] benzonatate (TESSALON) 100 MG capsule Take 1-2 capsules po up to 3 times daily PRN cough  . [DISCONTINUED] Guaifenesin-Codeine 300-10 MG/5ML LIQD Take 5 mLs by mouth at bedtime as needed.    EXAM:  BP 118/90  Pulse 96  Temp(Src) 99.1 F (37.3 C) (Oral)  Wt 145 lb (65.772 kg)  SpO2 97%  Body mass index is 24.88 kg/(m^2).  GENERAL: vitals reviewed and listed  above, alert, oriented, appears well hydrated and in no acute distress hoarse without stridor nasal crease like allergic dry hacking cough some bronchial sounds HEENT: atraumatic, conjunctiva  clear, no obvious abnormalities on inspection of external nose and ears TMs are clear nares congested clear face borderline tender maxillary negative frontal OP : no lesion edema mildly red no exudate NECK: no obvious masses on inspection palpation no adenopathy LUNGS: clear to auscultation bilaterally, no wheezes, rales or rhonchi, coughing in between tight cough CV: HRRR, no clubbing cyanosis or  peripheral edema nl cap refill  MS: moves all extremities without noticeable focal  abnormality PSYCH: pleasant and cooperative, no obvious depression or anxiety  ASSESSMENT AND PLAN:  Discussed the following assessment and  plan:  Cough over 2 weeks - Possible asthmatic bronchitis versus viral at this time don't add antibiotics unless has further signs of bacterial infection  Other acute sinusitis - Viral allergic other may be reactive  Nicotine use disorder - The patient is aware she needs to stop coughs with smoking use it as an opportunity  Anticoagulant long-term use  -Patient advised to return or notify health care team  if symptoms worsen ,persist or new concerns arise.  Patient Instructions  Cough syrup.  At night  `ok for comfort  mucinex may help loosen the cough  Stop tobacco as you are doing Take prednisone for poss  Wheezing bronchitis sinuitis   Add antibiotic cif drainage becomes  Thick yellow green etc  . Check with  Coumadin team as cabn effect INR contact on call team if fever shortness of breath  Other concerns or not getting better     Mariann Laster K. Panosh M.D.  Pre visit review using our clinic review tool, if applicable. No additional management support is needed unless otherwise documented below in the visit note.

## 2013-10-30 ENCOUNTER — Telehealth: Payer: Self-pay | Admitting: Family Medicine

## 2013-10-30 NOTE — Telephone Encounter (Signed)
Relevant patient education mailed to patient.  

## 2013-10-31 ENCOUNTER — Other Ambulatory Visit: Payer: Self-pay | Admitting: Obstetrics and Gynecology

## 2013-10-31 DIAGNOSIS — N644 Mastodynia: Secondary | ICD-10-CM

## 2013-10-31 LAB — HM MAMMOGRAPHY

## 2013-11-01 ENCOUNTER — Encounter: Payer: Self-pay | Admitting: Family Medicine

## 2013-11-01 ENCOUNTER — Ambulatory Visit (INDEPENDENT_AMBULATORY_CARE_PROVIDER_SITE_OTHER): Payer: BC Managed Care – PPO | Admitting: Family Medicine

## 2013-11-01 ENCOUNTER — Telehealth: Payer: Self-pay | Admitting: Family Medicine

## 2013-11-01 VITALS — BP 120/78 | HR 86 | Temp 98.5°F | Wt 147.0 lb

## 2013-11-01 DIAGNOSIS — R490 Dysphonia: Secondary | ICD-10-CM

## 2013-11-01 DIAGNOSIS — R059 Cough, unspecified: Secondary | ICD-10-CM

## 2013-11-01 DIAGNOSIS — R05 Cough: Secondary | ICD-10-CM

## 2013-11-01 MED ORDER — ALBUTEROL SULFATE HFA 108 (90 BASE) MCG/ACT IN AERS: 2.0000 | INHALATION_SPRAY | RESPIRATORY_TRACT | Status: DC | PRN

## 2013-11-01 NOTE — Telephone Encounter (Signed)
i have made referral.  Let her know

## 2013-11-01 NOTE — Patient Instructions (Signed)
Hoarseness Hoarseness is produced from a variety of causes. It is important to find the cause so it can be treated. In the absence of a cold or upper respiratory illness, any hoarseness lasting more than 2 weeks should be looked at by a specialist. This is especially important if you have a history of smoking or alcohol use. It is also important to keep in mind that as you grow older, your voice will naturally get weaker, making it easier for you to become hoarse from straining your vocal cords.  CAUSES  Any illness that affects your vocal cords can result in a hoarse voice. Examples of conditions that can affect the vocal cords are listed as follows:   Allergies.  Colds.  Sinusitis.  Gastroesophageal reflux disease.  Croup.  Injury.  Nodules.  Exposure to smoke or toxic fumes or gases.  Congenital and genetic defects.  Paralysis of the vocal cords.  Infections.  Advanced age. DIAGNOSIS  In order to diagnose the cause of your hoarseness, your caregiver will examine your throat using an instrument that uses a tube with a small lighted camera (laryngoscope). It allows your caregiver to look into the mouth and down the throat. TREATMENT  For most cases, treatment will focus on the specific cause of the hoarseness. Depending on the cause, hoarseness can be a temporary condition (acute) or it can be long lasting (chronic). Most cases of hoarseness clear up without complications. Your caregiver will explain to you if this is not likely to happen. SEEK IMMEDIATE MEDICAL CARE IF:   You have increasing hoarseness or loss of voice.  You have shortness of breath.  You are coughing up blood.  There is pain in your neck or throat. Document Released: 04/03/2005 Document Revised: 07/13/2011 Document Reviewed: 06/26/2010 [facility] Patient Information 2015 [place], [place]. This information is not intended to replace advice given to you by your health care provider. Make sure you discuss any  questions you have with your health care provider.  Please call me in one week if hoarseness no better.

## 2013-11-01 NOTE — Telephone Encounter (Signed)
Pt states dr. Elease Hashimoto informed her to let him know if she needing a referral for a ear, nose and throat dr, pt states she is ready and need the referral asap.

## 2013-11-01 NOTE — Progress Notes (Signed)
Pre visit review using our clinic review tool, if applicable. No additional management support is needed unless otherwise documented below in the visit note. 

## 2013-11-01 NOTE — Progress Notes (Signed)
   Subjective:    Patient ID: Jenny [name], female    DOB: 1950/06/12, 63 y.o.   MRN: [id number]  Cough Pertinent negatives include no chest pain, chills, fever or wheezing.   Patient seen with some persistent cough. Refer to recent note. She was seen just a few days ago. She has history of smoking about half-pack cigarettes per day. She's had some bilateral ear pressure and hoarseness for about 2 and half weeks. She has occasional postnasal drip symptoms. No obvious GERD symptoms. She had recent oral surgery and recovered well from that. She had seen her gynecologist recently and describes some vague pressure in her anterior neck and apparently had thyroid functions which are pending.  Recently prescribed prednisone and her symptoms of hoarseness did improve briefly with that. She never took the doxycycline. She also did not take any cough medication. No recent hemoptysis. No appetite or weight changes  Past Medical History  Diagnosis Date  . DVT (deep venous thrombosis)     1 year ago  . Hypercholesteremia    Past Surgical History  Procedure Laterality Date  . Cesarean section      reports that she has been smoking Cigarettes.  She has a 19.5 pack-year smoking history. She does not have any smokeless tobacco history on file. She reports that she does not drink alcohol or use illicit drugs. family history includes Cancer in her father; Hypertension in her mother; Stroke in her mother. No Known Allergies    Review of Systems  Constitutional: Negative for fever and chills.  HENT: Positive for congestion and voice change. Negative for trouble swallowing.   Respiratory: Positive for cough. Negative for wheezing.   Cardiovascular: Negative for chest pain.       Objective:   Physical Exam  Constitutional: She appears well-developed and well-nourished.  HENT:  Right Ear: External ear normal.  Left Ear: External ear normal.  Mouth/Throat: Oropharynx is clear and moist.  Neck: Neck  supple. No thyromegaly present.  Cardiovascular: Normal rate and regular rhythm.   Pulmonary/Chest: Effort normal and breath sounds normal. No respiratory distress. She has no wheezes. She has no rales.  Lymphadenopathy:    She has no cervical adenopathy.          Assessment & Plan:  Laryngitis/hoarseness. Question recent viral related vs other PND. No obvious GERD.  She describes some intermittent wheezing but none noted today. Prescription for Proair inhaler to use as needed. Recommend referral to ENT for further evaluation if laryngitis persists into next week. She will call and let us know. Consider trial of over-the-counter antihistamine for postnasal drip symptoms

## 2013-11-02 ENCOUNTER — Other Ambulatory Visit: Payer: BC Managed Care – PPO

## 2013-11-02 NOTE — Telephone Encounter (Signed)
Pt informed

## 2013-11-09 ENCOUNTER — Other Ambulatory Visit: Payer: BC Managed Care – PPO

## 2013-11-14 ENCOUNTER — Other Ambulatory Visit: Payer: Self-pay | Admitting: Family

## 2013-11-14 ENCOUNTER — Other Ambulatory Visit: Payer: Self-pay | Admitting: Family Medicine

## 2013-11-15 ENCOUNTER — Other Ambulatory Visit: Payer: Self-pay | Admitting: General Practice

## 2013-11-15 MED ORDER — WARFARIN SODIUM 3 MG PO TABS: ORAL_TABLET | ORAL | Status: DC

## 2013-11-15 MED ORDER — WARFARIN SODIUM 5 MG PO TABS: ORAL_TABLET | ORAL | Status: DC

## 2013-11-20 ENCOUNTER — Other Ambulatory Visit: Payer: BC Managed Care – PPO

## 2013-11-30 ENCOUNTER — Ambulatory Visit: Payer: BC Managed Care – PPO

## 2013-12-11 ENCOUNTER — Ambulatory Visit: Payer: BC Managed Care – PPO

## 2013-12-12 ENCOUNTER — Ambulatory Visit (INDEPENDENT_AMBULATORY_CARE_PROVIDER_SITE_OTHER): Payer: BC Managed Care – PPO | Admitting: Family

## 2013-12-12 DIAGNOSIS — Z5181 Encounter for therapeutic drug level monitoring: Secondary | ICD-10-CM

## 2013-12-12 LAB — POCT INR: INR: 2.5

## 2013-12-12 NOTE — Patient Instructions (Signed)
Continue to take 8 mg all days except 5 mg on M/W/F. Re-check in 4 weeks. Anticoagulation Dose Instructions as of 12/12/2013     Jenny [name] Tue Wed Thu Fri Sat   New Dose 8 mg 5 mg 8 mg 5 mg 8 mg 5 mg 8 mg    Description       Continue to take 8 mg all days except 5 mg on M/W/F. Re-check in 4 weeks.

## 2014-01-15 ENCOUNTER — Ambulatory Visit: Payer: BC Managed Care – PPO | Admitting: Family

## 2014-01-15 DIAGNOSIS — Z0289 Encounter for other administrative examinations: Secondary | ICD-10-CM

## 2014-01-22 ENCOUNTER — Other Ambulatory Visit: Payer: Self-pay | Admitting: Family Medicine

## 2014-02-04 ENCOUNTER — Other Ambulatory Visit: Payer: Self-pay | Admitting: Family Medicine

## 2014-02-22 ENCOUNTER — Ambulatory Visit (INDEPENDENT_AMBULATORY_CARE_PROVIDER_SITE_OTHER): Payer: BLUE CROSS/BLUE SHIELD | Admitting: Family Medicine

## 2014-02-22 ENCOUNTER — Encounter: Payer: Self-pay | Admitting: Family Medicine

## 2014-02-22 ENCOUNTER — Ambulatory Visit: Payer: BC Managed Care – PPO | Admitting: Family

## 2014-02-22 VITALS — BP 116/70 | HR 100 | Temp 98.6°F | Wt 143.0 lb

## 2014-02-22 DIAGNOSIS — R062 Wheezing: Secondary | ICD-10-CM | POA: Diagnosis not present

## 2014-02-22 DIAGNOSIS — I82409 Acute embolism and thrombosis of unspecified deep veins of unspecified lower extremity: Secondary | ICD-10-CM

## 2014-02-22 DIAGNOSIS — Z5181 Encounter for therapeutic drug level monitoring: Secondary | ICD-10-CM

## 2014-02-22 DIAGNOSIS — J019 Acute sinusitis, unspecified: Secondary | ICD-10-CM

## 2014-02-22 LAB — POCT INR: INR: 2.5

## 2014-02-22 MED ORDER — PREDNISONE 10 MG PO TABS: ORAL_TABLET | ORAL | Status: DC

## 2014-02-22 MED ORDER — ALBUTEROL SULFATE HFA 108 (90 BASE) MCG/ACT IN AERS
2.0000 | INHALATION_SPRAY | RESPIRATORY_TRACT | Status: DC | PRN
Start: 2014-02-22 — End: 2015-07-17

## 2014-02-22 MED ORDER — AMOXICILLIN 875 MG PO TABS: 875.0000 mg | ORAL_TABLET | Freq: Two times a day (BID) | ORAL | Status: DC

## 2014-02-22 NOTE — Progress Notes (Signed)
Pre visit review using our clinic review tool, if applicable. No additional management support is needed unless otherwise documented below in the visit note. 

## 2014-02-22 NOTE — Patient Instructions (Signed)
Follow up promptly for any fever or increased shortness of breath. 

## 2014-02-22 NOTE — Progress Notes (Signed)
   Subjective:    Patient ID: Jenny [name], female    DOB: 1951-04-21, 63 y.o.   MRN: [id number]  Cough Associated symptoms include wheezing. Pertinent negatives include no chest pain, chills or fever.   Acute visit here patient seen with about 10-14 day history of cough and nasal congestion. She states that she start a typical cold symptoms. She has had progressive wheezing and productive cough for past few days. She has bilateral maxillary facial pressure. She is in process of trying to quit smoking. She's not any documented fevers or chills.  She is on Coumadin with history of recurrent DVT. She is overdue to get her protime checked. No recent bleeding complications. Patient states she's taken doxycycline in the past which did not work well but she's not any specific intolerance. She feels she is wheezing some off and on.  Past Medical History  Diagnosis Date  . DVT (deep venous thrombosis)     1 year ago  . Hypercholesteremia    Past Surgical History  Procedure Laterality Date  . Cesarean section      reports that she has been smoking Cigarettes.  She has a 19.5 pack-year smoking history. She does not have any smokeless tobacco history on file. She reports that she does not drink alcohol or use illicit drugs. family history includes Cancer in her father; Hypertension in her mother; Stroke in her mother. No Known Allergies    Review of Systems  Constitutional: Negative for fever and chills.  HENT: Positive for congestion. Negative for facial swelling.   Respiratory: Positive for cough and wheezing.   Cardiovascular: Negative for chest pain.       Objective:   Physical Exam  Constitutional: She appears well-developed and well-nourished.  HENT:  Right Ear: External ear normal.  Left Ear: External ear normal.  Mouth/Throat: Oropharynx is clear and moist.  Neck: Neck supple.  Cardiovascular: Normal rate and regular rhythm.   Pulmonary/Chest: Effort normal. She has wheezes.  She has no rales.  Musculoskeletal: She exhibits no edema.  Lymphadenopathy:    She has no cervical adenopathy.          Assessment & Plan:  Patient presents with progressive upper respiratory symptoms as above. With duration of productive cough and persistent sinus pressure we'll start amoxicillin 875 mg twice daily for 10 days. Prednisone taper starting at 40 mg daily. Refill albuterol inhaler for as needed use. She is encouraged to quit smoking altogether.  Patient needs repeat pro time and this will be checked today

## 2014-02-23 ENCOUNTER — Telehealth: Payer: Self-pay | Admitting: Family Medicine

## 2014-02-23 NOTE — Telephone Encounter (Signed)
emmi emailed °

## 2014-02-26 ENCOUNTER — Other Ambulatory Visit: Payer: Self-pay | Admitting: Family Medicine

## 2014-04-05 ENCOUNTER — Other Ambulatory Visit: Payer: Self-pay | Admitting: Family

## 2014-06-17 ENCOUNTER — Other Ambulatory Visit: Payer: Self-pay | Admitting: Family

## 2014-06-17 ENCOUNTER — Other Ambulatory Visit: Payer: Self-pay | Admitting: Family Medicine

## 2014-06-18 ENCOUNTER — Other Ambulatory Visit: Payer: Self-pay | Admitting: General Practice

## 2014-06-18 MED ORDER — WARFARIN SODIUM 3 MG PO TABS: ORAL_TABLET | ORAL | Status: DC

## 2014-06-18 MED ORDER — WARFARIN SODIUM 5 MG PO TABS: ORAL_TABLET | ORAL | Status: DC

## 2014-07-12 ENCOUNTER — Ambulatory Visit (INDEPENDENT_AMBULATORY_CARE_PROVIDER_SITE_OTHER): Payer: BLUE CROSS/BLUE SHIELD | Admitting: Family

## 2014-07-12 DIAGNOSIS — Z5181 Encounter for therapeutic drug level monitoring: Secondary | ICD-10-CM

## 2014-07-12 LAB — POCT INR: INR: 2.9

## 2014-07-12 MED ORDER — WARFARIN SODIUM 5 MG PO TABS: ORAL_TABLET | ORAL | Status: DC

## 2014-07-12 MED ORDER — WARFARIN SODIUM 3 MG PO TABS: ORAL_TABLET | ORAL | Status: DC

## 2014-07-12 NOTE — Patient Instructions (Signed)
Continue to take 8 mg all days except 5 mg on M/W/F. Re-check in 6 weeks.  Anticoagulation Dose Instructions as of 07/12/2014      Dorene Grebe Tue Wed Thu Fri Sat   New Dose 8 mg 5 mg 8 mg 5 mg 8 mg 5 mg 8 mg    Description        Continue to take 8 mg all days except 5 mg on M/W/F. Re-check in 6 weeks.

## 2014-08-16 ENCOUNTER — Ambulatory Visit (INDEPENDENT_AMBULATORY_CARE_PROVIDER_SITE_OTHER): Payer: BLUE CROSS/BLUE SHIELD | Admitting: Family Medicine

## 2014-08-16 ENCOUNTER — Encounter: Payer: Self-pay | Admitting: Family Medicine

## 2014-08-16 ENCOUNTER — Ambulatory Visit (INDEPENDENT_AMBULATORY_CARE_PROVIDER_SITE_OTHER)
Admission: RE | Admit: 2014-08-16 | Discharge: 2014-08-16 | Disposition: A | Discharge status: Home / Self Care | Payer: BLUE CROSS/BLUE SHIELD | Source: Ambulatory Visit | Attending: Family Medicine | Admitting: Family Medicine

## 2014-08-16 VITALS — BP 120/78 | HR 86 | Temp 98.6°F | Wt 145.0 lb

## 2014-08-16 DIAGNOSIS — M79672 Pain in left foot: Secondary | ICD-10-CM

## 2014-08-16 DIAGNOSIS — B351 Tinea unguium: Secondary | ICD-10-CM | POA: Diagnosis not present

## 2014-08-16 DIAGNOSIS — E785 Hyperlipidemia, unspecified: Secondary | ICD-10-CM

## 2014-08-16 MED ORDER — TERBINAFINE HCL 250 MG PO TABS: 250.0000 mg | ORAL_TABLET | Freq: Every day | ORAL | Status: DC

## 2014-08-16 NOTE — Progress Notes (Signed)
Pre visit review using our clinic review tool, if applicable. No additional management support is needed unless otherwise documented below in the visit note. 

## 2014-08-16 NOTE — Patient Instructions (Signed)
Ringworm, Nail A fungal infection of the nail (tinea unguium/onychomycosis) is common. It is common as the visible part of the nail is composed of dead cells which have no blood supply to help prevent infection. It occurs because fungi are everywhere and will pick any opportunity to grow on any dead material. Because nails are very slow growing they require up to 2 years of treatment with anti-fungal medications. The entire nail back to the base is infected. This includes approximately  of the nail which you cannot see. If your caregiver has prescribed a medication by mouth, take it every day and as directed. No progress will be seen for at least 6 to 9 months. Do not be disappointed! Because fungi live on dead cells with little or no exposure to blood supply, medication delivery to the infection is slow; thus the cure is slow. It is also why you can observe no progress in the first 6 months. The nail becoming cured is the base of the nail, as it has the blood supply. Topical medication such as creams and ointments are usually not effective. Important in successful treatment of nail fungus is closely following the medication regimen that your doctor prescribes. Sometimes you and your caregiver may elect to speed up this process by surgical removal of all the nails. Even this may still require 6 to 9 months of additional oral medications. See your caregiver as directed. Remember there will be no visible improvement for at least 6 months. See your caregiver sooner if other signs of infection (redness and swelling) develop. Document Released: 04/17/2000 Document Revised: 07/13/2011 Document Reviewed: 06/26/2008 [facility] Patient Information 2015 [place], [place]. This information is not intended to replace advice given to you by your health care provider. Make sure you discuss any questions you have with your health care provider.

## 2014-08-16 NOTE — Progress Notes (Signed)
   Subjective:    Patient ID: Jenny [name], female    DOB: 1950-09-12, 64 y.o.   MRN: [id number]  HPI Patient seen for the following issues  Patient has foot pain. Left foot pain. No injury. Mostly MTP joint. No warmth or erythema. No visible swelling. She's had some chronic thickening and pain interphalangeal joint. No change of shoe wear. Tylenol helps. Pain worse with ambulation. Not at rest.  Thickened brittle nail changes. She's had these involving several toenails over several years. She would like to explore treatment options.  Hyperlipidemia. Currently on Lipitor. Over 2 years since previous labs for lipids and hepatic panel. No chest pains. No dyspnea. No myalgias.  Past Medical History  Diagnosis Date  . DVT (deep venous thrombosis)     1 year ago  . Hypercholesteremia    Past Surgical History  Procedure Laterality Date  . Cesarean section      reports that she has been smoking Cigarettes.  She has a 19.5 pack-year smoking history. She does not have any smokeless tobacco history on file. She reports that she does not drink alcohol or use illicit drugs. family history includes Cancer in her father; Hypertension in her mother; Stroke in her mother. No Known Allergies    Review of Systems  Constitutional: Negative for fatigue.  Eyes: Negative for visual disturbance.  Respiratory: Negative for cough, chest tightness, shortness of breath and wheezing.   Cardiovascular: Negative for chest pain, palpitations and leg swelling.  Neurological: Negative for dizziness, seizures, syncope, weakness, light-headedness and headaches.       Objective:   Physical Exam  Constitutional: She appears well-developed and well-nourished.  Cardiovascular: Normal rate and regular rhythm.   Pulmonary/Chest: Effort normal and breath sounds normal. No respiratory distress. She has no wheezes. She has no rales.  Musculoskeletal:  Left foot reveals some hypertrophic changes MTP joint. No warmth  erythema. No localized tenderness to palpation. Foot is warm to touch with good capillary refill.  Skin:  She has thickened hyperkeratotic nail changes diffusely          Assessment & Plan:  #1 left foot pain. Mostly MTP joint. No inflammatory changes. No evidence to suggest gout. Suspect osteoarthritis. Obtain x-rays to start. #2 onychomycosis involving several toenails. Check hepatic panel. If normal, Lamisil 250 mg daily for 3 months. Monitor Coumadin closely #3 hyperlipidemia. Lipid and hepatic panel ordered. She will return fasting

## 2014-08-17 ENCOUNTER — Other Ambulatory Visit: Payer: BLUE CROSS/BLUE SHIELD

## 2014-08-17 LAB — BASIC METABOLIC PANEL
BUN: 15 mg/dL (ref 6–23)
CALCIUM: 9.5 mg/dL (ref 8.4–10.5)
CO2: 30 meq/L (ref 19–32)
Chloride: 103 mEq/L (ref 96–112)
Creatinine, Ser: 0.74 mg/dL (ref 0.40–1.20)
GFR: 84.6 mL/min (ref >60.00)
Glucose, Bld: 87 mg/dL (ref 70–99)
Potassium: 3.8 mEq/L (ref 3.5–5.1)
Sodium: 137 mEq/L (ref 135–145)

## 2014-08-17 LAB — LIPID PANEL
CHOL/HDL RATIO: 5
Cholesterol: 179 mg/dL (ref 0–200)
HDL: 36.1 mg/dL — ABNORMAL LOW (ref >39.00)
NonHDL: 142.9
Triglycerides: 251 mg/dL — ABNORMAL HIGH (ref 0.0–149.0)
VLDL: 50.2 mg/dL — ABNORMAL HIGH (ref 0.0–40.0)

## 2014-08-17 LAB — LDL CHOLESTEROL, DIRECT: Direct LDL: 103 mg/dL

## 2014-08-17 LAB — HEPATIC FUNCTION PANEL
ALK PHOS: 63 U/L (ref 39–117)
ALT: 17 U/L (ref 0–35)
AST: 15 U/L (ref 0–37)
Albumin: 4.3 g/dL (ref 3.5–5.2)
Bilirubin, Direct: 0 mg/dL (ref 0.0–0.3)
TOTAL PROTEIN: 7.3 g/dL (ref 6.0–8.3)
Total Bilirubin: 0.6 mg/dL (ref 0.2–1.2)

## 2014-08-17 NOTE — Addendum Note (Signed)
Addended by: Elmer Picker on: 08/17/2014 09:14 AM   Modules accepted: Orders

## 2014-08-21 ENCOUNTER — Telehealth: Payer: Self-pay | Admitting: Family Medicine

## 2014-08-21 NOTE — Telephone Encounter (Signed)
Pt had xrays  Last Thursday and has not been called with the results. Would like a call back today if possible.

## 2014-08-21 NOTE — Telephone Encounter (Signed)
Called and spoke to patient. Made patient aware  DG of foot was normal and MD Burchette recommends podiatrist if pain worsens. Patient verbalized understanding.

## 2014-08-23 ENCOUNTER — Ambulatory Visit (INDEPENDENT_AMBULATORY_CARE_PROVIDER_SITE_OTHER): Payer: BLUE CROSS/BLUE SHIELD | Admitting: General Practice

## 2014-08-23 DIAGNOSIS — Z5181 Encounter for therapeutic drug level monitoring: Secondary | ICD-10-CM

## 2014-08-23 LAB — POCT INR: INR: 2.8

## 2014-08-23 NOTE — Progress Notes (Signed)
Pre visit review using our clinic review tool, if applicable. No additional management support is needed unless otherwise documented below in the visit note. 

## 2014-09-27 ENCOUNTER — Ambulatory Visit (INDEPENDENT_AMBULATORY_CARE_PROVIDER_SITE_OTHER): Payer: BLUE CROSS/BLUE SHIELD | Admitting: Family Medicine

## 2014-09-27 ENCOUNTER — Encounter: Payer: Self-pay | Admitting: Family Medicine

## 2014-09-27 VITALS — BP 126/70 | HR 96 | Temp 98.5°F | Wt 143.0 lb

## 2014-09-27 DIAGNOSIS — R05 Cough: Secondary | ICD-10-CM

## 2014-09-27 DIAGNOSIS — R059 Cough, unspecified: Secondary | ICD-10-CM

## 2014-09-27 MED ORDER — AMOXICILLIN 875 MG PO TABS: 875.0000 mg | ORAL_TABLET | Freq: Two times a day (BID) | ORAL | Status: DC

## 2014-09-27 NOTE — Progress Notes (Signed)
Pre visit review using our clinic review tool, if applicable. No additional management support is needed unless otherwise documented below in the visit note. 

## 2014-09-27 NOTE — Progress Notes (Signed)
   Subjective:    Patient ID: Jenny [name], female    DOB: 22-Feb-1951, 64 y.o.   MRN: [id number]  HPI Patient seen for acute visit with cough. Onset about 3-4 days ago. She does not have any formal diagnosis of COPD but has been smoking about 40 years and does have suggested changes on previous chest x-ray. Cough productive of yellow sputum. No definite fever. Increased volume of mucus compared to baseline. No obvious wheezing. She's had some bilateral maxillary facial pressure and intermittent headaches. Cough especially severe at night. She is on Coumadin secondary to history of recurrent DVT  Past Medical History  Diagnosis Date  . DVT (deep venous thrombosis)     1 year ago  . Hypercholesteremia    Past Surgical History  Procedure Laterality Date  . Cesarean section      reports that she has been smoking Cigarettes.  She has a 19.5 pack-year smoking history. She does not have any smokeless tobacco history on file. She reports that she does not drink alcohol or use illicit drugs. family history includes Cancer in her father; Hypertension in her mother; Stroke in her mother. No Known Allergies    Review of Systems  Constitutional: Negative for fever, chills, appetite change and unexpected weight change.  HENT: Positive for congestion and sinus pressure. Negative for trouble swallowing.   Respiratory: Positive for cough.   Cardiovascular: Negative for chest pain.       Objective:   Physical Exam  Constitutional: She appears well-developed and well-nourished. No distress.  HENT:  Right Ear: External ear normal.  Left Ear: External ear normal.  Mouth/Throat: Oropharynx is clear and moist.  Neck: Neck supple.  Cardiovascular: Normal rate and regular rhythm.   Pulmonary/Chest: Effort normal and breath sounds normal. No respiratory distress. She has no wheezes. She has no rales.  Lymphadenopathy:    She has no cervical adenopathy.          Assessment & Plan:  Cough.  Differential is acute viral bronchitis versus early sinusitis. We've recommended observation for now. She'll try over-the-counter Mucinex. If she develops any fever or worsening symptoms start amoxicillin 875 mg twice daily for 10 days. She is encouraged to quit smoking

## 2014-09-27 NOTE — Patient Instructions (Signed)
Consider over the counter Mucinex Drink plenty of fluids Follow up for any fever or increased shortness of breath.

## 2014-10-09 ENCOUNTER — Telehealth: Payer: Self-pay

## 2014-10-09 NOTE — Telephone Encounter (Signed)
Mammogram not due yet. Last one 10/31/13.

## 2014-10-11 ENCOUNTER — Ambulatory Visit (INDEPENDENT_AMBULATORY_CARE_PROVIDER_SITE_OTHER): Payer: BLUE CROSS/BLUE SHIELD | Admitting: General Practice

## 2014-10-11 DIAGNOSIS — Z5181 Encounter for therapeutic drug level monitoring: Secondary | ICD-10-CM | POA: Diagnosis not present

## 2014-10-11 LAB — POCT INR: INR: 2.4

## 2014-10-11 NOTE — Progress Notes (Signed)
Pre visit review using our clinic review tool, if applicable. No additional management support is needed unless otherwise documented below in the visit note. 

## 2014-11-22 ENCOUNTER — Ambulatory Visit: Payer: BLUE CROSS/BLUE SHIELD

## 2014-11-29 ENCOUNTER — Other Ambulatory Visit: Payer: Self-pay | Admitting: Family

## 2014-12-02 ENCOUNTER — Encounter: Payer: Self-pay | Admitting: Cardiology

## 2014-12-06 ENCOUNTER — Ambulatory Visit (INDEPENDENT_AMBULATORY_CARE_PROVIDER_SITE_OTHER): Payer: BLUE CROSS/BLUE SHIELD | Admitting: General Practice

## 2014-12-06 DIAGNOSIS — I82409 Acute embolism and thrombosis of unspecified deep veins of unspecified lower extremity: Secondary | ICD-10-CM

## 2014-12-06 DIAGNOSIS — Z5181 Encounter for therapeutic drug level monitoring: Secondary | ICD-10-CM | POA: Diagnosis not present

## 2014-12-06 LAB — POCT INR: INR: 2.7

## 2014-12-06 NOTE — Progress Notes (Signed)
Pre visit review using our clinic review tool, if applicable. No additional management support is needed unless otherwise documented below in the visit note. 

## 2014-12-06 NOTE — Progress Notes (Signed)
Agree with recommendations.  

## 2015-01-17 ENCOUNTER — Encounter: Payer: Self-pay | Admitting: Adult Health

## 2015-01-17 ENCOUNTER — Ambulatory Visit (INDEPENDENT_AMBULATORY_CARE_PROVIDER_SITE_OTHER): Payer: BLUE CROSS/BLUE SHIELD | Admitting: Adult Health

## 2015-01-17 ENCOUNTER — Ambulatory Visit (INDEPENDENT_AMBULATORY_CARE_PROVIDER_SITE_OTHER): Payer: BLUE CROSS/BLUE SHIELD | Admitting: General Practice

## 2015-01-17 VITALS — BP 100/52 | HR 94 | Ht 65.0 in | Wt 141.1 lb

## 2015-01-17 DIAGNOSIS — M545 Low back pain, unspecified: Secondary | ICD-10-CM

## 2015-01-17 DIAGNOSIS — Z5181 Encounter for therapeutic drug level monitoring: Secondary | ICD-10-CM | POA: Diagnosis not present

## 2015-01-17 LAB — POCT INR: INR: 2.9

## 2015-01-17 MED ORDER — METHOCARBAMOL 750 MG PO TABS: 750.0000 mg | ORAL_TABLET | Freq: Three times a day (TID) | ORAL | Status: DC | PRN

## 2015-01-17 NOTE — Patient Instructions (Addendum)
It was great meeting you today!  Take the Robaxin tonight to see how it makes you feel. You can take it 3 times a day as needed.   Take Tylenol every 6-8 hours for the next 3 days.   Continue to use heat and stretch it out as much as possible.   Follow up if no improvement in the next 2-3 days.   Lumbosacral Strain Lumbosacral strain is a strain of any of the parts that make up your lumbosacral vertebrae. Your lumbosacral vertebrae are the bones that make up the lower third of your backbone. Your lumbosacral vertebrae are held together by muscles and tough, fibrous tissue (ligaments).  CAUSES  A sudden blow to your back can cause lumbosacral strain. Also, anything that causes an excessive stretch of the muscles in the low back can cause this strain. This is typically seen when people exert themselves strenuously, fall, lift heavy objects, bend, or crouch repeatedly. RISK FACTORS  Physically demanding work.  Participation in pushing or pulling sports or sports that require a sudden twist of the back (tennis, golf, baseball).  Weight lifting.  Excessive lower back curvature.  Forward-tilted pelvis.  Weak back or abdominal muscles or both.  Tight hamstrings. SIGNS AND SYMPTOMS  Lumbosacral strain may cause pain in the area of your injury or pain that moves (radiates) down your leg.  DIAGNOSIS Your health care provider can often diagnose lumbosacral strain through a physical exam. In some cases, you may need tests such as X-ray exams.  TREATMENT  Treatment for your lower back injury depends on many factors that your clinician will have to evaluate. However, most treatment will include the use of anti-inflammatory medicines. HOME CARE INSTRUCTIONS   Avoid hard physical activities (tennis, racquetball, waterskiing) if you are not in proper physical condition for it. This may aggravate or create problems.  If you have a back problem, avoid sports requiring sudden body movements.  Swimming and walking are generally safer activities.  Maintain good posture.  Maintain a healthy weight.  For acute conditions, you may put ice on the injured area.  Put ice in a plastic bag.  Place a towel between your skin and the bag.  Leave the ice on for 20 minutes, 2-3 times a day.  When the low back starts healing, stretching and strengthening exercises may be recommended. SEEK MEDICAL CARE IF:  Your back pain is getting worse.  You experience severe back pain not relieved with medicines. SEEK IMMEDIATE MEDICAL CARE IF:   You have numbness, tingling, weakness, or problems with the use of your arms or legs.  There is a change in bowel or bladder control.  You have increasing pain in any area of the body, including your belly (abdomen).  You notice shortness of breath, dizziness, or feel faint.  You feel sick to your stomach (nauseous), are throwing up (vomiting), or become sweaty.  You notice discoloration of your toes or legs, or your feet get very cold. MAKE SURE YOU:   Understand these instructions.  Will watch your condition.  Will get help right away if you are not doing well or get worse. Document Released: 01/28/2005 Document Revised: 04/25/2013 Document Reviewed: 12/07/2012 [facility] Patient Information 2015 [place], [place]. This information is not intended to replace advice given to you by your health care provider. Make sure you discuss any questions you have with your health care provider. Lumbosacral Strain Lumbosacral strain is a strain of any of the parts that make up your lumbosacral vertebrae.  Your lumbosacral vertebrae are the bones that make up the lower third of your backbone. Your lumbosacral vertebrae are held together by muscles and tough, fibrous tissue (ligaments).  CAUSES  A sudden blow to your back can cause lumbosacral strain. Also, anything that causes an excessive stretch of the muscles in the low back can cause this strain. This is  typically seen when people exert themselves strenuously, fall, lift heavy objects, bend, or crouch repeatedly. RISK FACTORS  Physically demanding work.  Participation in pushing or pulling sports or sports that require a sudden twist of the back (tennis, golf, baseball).  Weight lifting.  Excessive lower back curvature.  Forward-tilted pelvis.  Weak back or abdominal muscles or both.  Tight hamstrings. SIGNS AND SYMPTOMS  Lumbosacral strain may cause pain in the area of your injury or pain that moves (radiates) down your leg.  DIAGNOSIS Your health care provider can often diagnose lumbosacral strain through a physical exam. In some cases, you may need tests such as X-ray exams.  TREATMENT  Treatment for your lower back injury depends on many factors that your clinician will have to evaluate. However, most treatment will include the use of anti-inflammatory medicines. HOME CARE INSTRUCTIONS   Avoid hard physical activities (tennis, racquetball, waterskiing) if you are not in proper physical condition for it. This may aggravate or create problems.  If you have a back problem, avoid sports requiring sudden body movements. Swimming and walking are generally safer activities.  Maintain good posture.  Maintain a healthy weight.  For acute conditions, you may put ice on the injured area.  Put ice in a plastic bag.  Place a towel between your skin and the bag.  Leave the ice on for 20 minutes, 2-3 times a day.  When the low back starts healing, stretching and strengthening exercises may be recommended. SEEK MEDICAL CARE IF:  Your back pain is getting worse.  You experience severe back pain not relieved with medicines. SEEK IMMEDIATE MEDICAL CARE IF:   You have numbness, tingling, weakness, or problems with the use of your arms or legs.  There is a change in bowel or bladder control.  You have increasing pain in any area of the body, including your belly (abdomen).  You  notice shortness of breath, dizziness, or feel faint.  You feel sick to your stomach (nauseous), are throwing up (vomiting), or become sweaty.  You notice discoloration of your toes or legs, or your feet get very cold. MAKE SURE YOU:   Understand these instructions.  Will watch your condition.  Will get help right away if you are not doing well or get worse. Document Released: 01/28/2005 Document Revised: 04/25/2013 Document Reviewed: 12/07/2012 [facility] Patient Information 2015 [place], [place]. This information is not intended to replace advice given to you by your health care provider. Make sure you discuss any questions you have with your health care provider.

## 2015-01-17 NOTE — Progress Notes (Signed)
Subjective:    Patient ID: Jenny [name], female    DOB: January 05, 1951, 64 y.o.   MRN: [id number]  HPI  64 year old female who presents to the office today with lower back pain. She has been painting windows and baseboards in her house for the last two weeks. Over the last four days she endorses that her lower back pain has started and it has been getting worse every day. Pain is described as "stiff or  tight". She has been taking Tylenol, which helps with the pain. Pain is worse with movement, picking up items, and bending over.   She used the heated seats in her car today and when she was driving her back felt much better. When she sgot out of the car her back tightended up.   Denies any trauma, numbness or tingling in extremities. No problems with bowel or bladder.    Review of Systems  Constitutional: Positive for activity change.  Respiratory: Negative.   Cardiovascular: Negative.   Musculoskeletal: Positive for myalgias and back pain. Negative for arthralgias, gait problem, neck pain and neck stiffness.  Neurological: Negative.   All other systems reviewed and are negative.  Past Medical History  Diagnosis Date  . DVT (deep venous thrombosis)     1 year ago  . Hypercholesteremia     Social History   Social History  . Marital Status: Married    Spouse Name: N/A  . Number of Children: N/A  . Years of Education: N/A   Occupational History  . Not on file.   Social History Main Topics  . Smoking status: Current Every Day Smoker -- 0.50 packs/day for 39 years    Types: Cigarettes  . Smokeless tobacco: Not on file  . Alcohol Use: No  . Drug Use: No  . Sexual Activity: No   Other Topics Concern  . Not on file   Social History Narrative    Past Surgical History  Procedure Laterality Date  . Cesarean section      Family History  Problem Relation Age of Onset  . Stroke Mother   . Hypertension Mother   . Cancer Father     colon    No Known  Allergies  Current Outpatient Prescriptions on File Prior to Visit  Medication Sig Dispense Refill  . atorvastatin (LIPITOR) 10 MG tablet TAKE 1 TABLET BY MOUTH EVERY DAY 90 tablet 1  . Vitamin D, Ergocalciferol, (DRISDOL) 50000 UNITS CAPS capsule TAKE ONE CAPSULE BY MOUTH ONCE A WEEK ON THURSDAYS 12 capsule 3  . warfarin (COUMADIN) 3 MG tablet Take as directed by anticoagulation clinic 30 tablet 2  . warfarin (COUMADIN) 5 MG tablet TAKE AS DIRECTED BY ANTICOAGULATION CLINIC 45 tablet 2  . albuterol (PROAIR HFA) 108 (90 BASE) MCG/ACT inhaler Inhale 2 puffs into the lungs every 4 (four) hours as needed for wheezing or shortness of breath. (Patient not taking: Reported on 01/17/2015) 1 Inhaler 2   No current facility-administered medications on file prior to visit.    BP 100/52 mmHg  Pulse 94  Ht '5\' 5"'$  (1.651 m)  Wt 141 lb 1.6 oz (64.003 kg)  BMI 23.48 kg/m2  SpO2 97%       Objective:   Physical Exam  Constitutional: She is oriented to person, place, and time. She appears well-developed and well-nourished. No distress.  Cardiovascular: Normal rate, regular rhythm, normal heart sounds and intact distal pulses.  Exam reveals no gallop and no friction rub.   No  murmur heard. Pulmonary/Chest: Effort normal and breath sounds normal. No respiratory distress. She has no wheezes. She has no rales. She exhibits no tenderness.  Musculoskeletal: Normal range of motion. She exhibits tenderness (lumbosacral area). She exhibits no edema.  Neurological: She is alert and oriented to person, place, and time.  Skin: Skin is warm and dry. No rash noted. She is not diaphoretic. No erythema. No pallor.  No bruising, redness or warmth to area in question  Psychiatric: She has a normal mood and affect. Her behavior is normal. Judgment and thought content normal.  Nursing note and vitals reviewed.      Assessment & Plan:  1. Bilateral low back pain without sciatica - Appears MSK in origin. Likely muscle  strain from bending over while painting. Less likely to slippded disk, joint disease,  Isthmic spondylolisthesis - methocarbamol (ROBAXIN-750) 750 MG tablet; Take 1 tablet (750 mg total) by mouth every 8 (eight) hours as needed for muscle spasms.  Dispense: 30 tablet; Refill: 0 - Consider X ray if no improvement to r/o any spinal issue. - Tylenol '500mg'$  every 8 hours for next three days.  - Heating pad to area.  - Stretching exercises.

## 2015-01-17 NOTE — Progress Notes (Signed)
Pre visit review using our clinic review tool, if applicable. No additional management support is needed unless otherwise documented below in the visit note. 

## 2015-01-17 NOTE — Progress Notes (Deleted)
left bundle branch block

## 2015-01-18 ENCOUNTER — Ambulatory Visit: Payer: BLUE CROSS/BLUE SHIELD | Admitting: Family Medicine

## 2015-02-28 ENCOUNTER — Ambulatory Visit (INDEPENDENT_AMBULATORY_CARE_PROVIDER_SITE_OTHER): Payer: BLUE CROSS/BLUE SHIELD | Admitting: General Practice

## 2015-02-28 DIAGNOSIS — Z5181 Encounter for therapeutic drug level monitoring: Secondary | ICD-10-CM | POA: Diagnosis not present

## 2015-02-28 LAB — POCT INR: INR: 2.1

## 2015-02-28 NOTE — Progress Notes (Signed)
Pre visit review using our clinic review tool, if applicable. No additional management support is needed unless otherwise documented below in the visit note. 

## 2015-02-28 NOTE — Progress Notes (Signed)
Agree with Coumadin management 

## 2015-03-02 ENCOUNTER — Other Ambulatory Visit: Payer: Self-pay | Admitting: Family Medicine

## 2015-03-05 ENCOUNTER — Other Ambulatory Visit: Payer: Self-pay | Admitting: Family Medicine

## 2015-03-05 ENCOUNTER — Ambulatory Visit (INDEPENDENT_AMBULATORY_CARE_PROVIDER_SITE_OTHER): Payer: BLUE CROSS/BLUE SHIELD | Admitting: Family Medicine

## 2015-03-05 ENCOUNTER — Encounter: Payer: Self-pay | Admitting: Family Medicine

## 2015-03-05 ENCOUNTER — Emergency Department (HOSPITAL_COMMUNITY): Payer: BLUE CROSS/BLUE SHIELD

## 2015-03-05 ENCOUNTER — Encounter (HOSPITAL_COMMUNITY): Payer: Self-pay | Admitting: *Deleted

## 2015-03-05 ENCOUNTER — Emergency Department (HOSPITAL_COMMUNITY)
Admission: EM | Admit: 2015-03-05 | Discharge: 2015-03-05 | Disposition: A | Discharge status: Home / Self Care | Payer: BLUE CROSS/BLUE SHIELD | Attending: Emergency Medicine | Admitting: Emergency Medicine

## 2015-03-05 ENCOUNTER — Telehealth: Payer: Self-pay | Admitting: *Deleted

## 2015-03-05 VITALS — BP 130/88 | HR 93 | Temp 98.2°F | Ht 65.0 in | Wt 142.4 lb

## 2015-03-05 DIAGNOSIS — Z79899 Other long term (current) drug therapy: Secondary | ICD-10-CM | POA: Diagnosis not present

## 2015-03-05 DIAGNOSIS — S60211A Contusion of right wrist, initial encounter: Secondary | ICD-10-CM | POA: Diagnosis not present

## 2015-03-05 DIAGNOSIS — G44319 Acute post-traumatic headache, not intractable: Secondary | ICD-10-CM | POA: Diagnosis not present

## 2015-03-05 DIAGNOSIS — E7800 Pure hypercholesterolemia, unspecified: Secondary | ICD-10-CM | POA: Insufficient documentation

## 2015-03-05 DIAGNOSIS — Y998 Other external cause status: Secondary | ICD-10-CM | POA: Insufficient documentation

## 2015-03-05 DIAGNOSIS — Z86718 Personal history of other venous thrombosis and embolism: Secondary | ICD-10-CM | POA: Insufficient documentation

## 2015-03-05 DIAGNOSIS — Y9389 Activity, other specified: Secondary | ICD-10-CM | POA: Insufficient documentation

## 2015-03-05 DIAGNOSIS — Z720 Tobacco use: Secondary | ICD-10-CM | POA: Diagnosis not present

## 2015-03-05 DIAGNOSIS — Y92009 Unspecified place in unspecified non-institutional (private) residence as the place of occurrence of the external cause: Secondary | ICD-10-CM | POA: Insufficient documentation

## 2015-03-05 DIAGNOSIS — S0003XA Contusion of scalp, initial encounter: Secondary | ICD-10-CM | POA: Insufficient documentation

## 2015-03-05 DIAGNOSIS — W01198A Fall on same level from slipping, tripping and stumbling with subsequent striking against other object, initial encounter: Secondary | ICD-10-CM | POA: Diagnosis not present

## 2015-03-05 DIAGNOSIS — Z7901 Long term (current) use of anticoagulants: Secondary | ICD-10-CM

## 2015-03-05 DIAGNOSIS — W19XXXA Unspecified fall, initial encounter: Secondary | ICD-10-CM

## 2015-03-05 DIAGNOSIS — S0990XA Unspecified injury of head, initial encounter: Secondary | ICD-10-CM | POA: Diagnosis present

## 2015-03-05 DIAGNOSIS — H539 Unspecified visual disturbance: Secondary | ICD-10-CM

## 2015-03-05 NOTE — Discharge Instructions (Signed)
Facial or Scalp Contusion A facial or scalp contusion is a deep bruise on the face or head. Injuries to the face and head generally cause a lot of swelling, especially around the eyes. Contusions are the result of an injury that caused bleeding under the skin. The contusion may turn blue, purple, or yellow. Minor injuries will give you a painless contusion, but more severe contusions may stay painful and swollen for a few weeks.  CAUSES  A facial or scalp contusion is caused by a blunt injury or trauma to the face or head area.  SIGNS AND SYMPTOMS   Swelling of the injured area.   Discoloration of the injured area.   Tenderness, soreness, or pain in the injured area.  DIAGNOSIS  The diagnosis can be made by taking a medical history and doing a physical exam. An X-ray exam, CT scan, or MRI may be needed to determine if there are any associated injuries, such as broken bones (fractures). TREATMENT  Often, the best treatment for a facial or scalp contusion is applying cold compresses to the injured area. Over-the-counter medicines may also be recommended for pain control.  HOME CARE INSTRUCTIONS   Only take over-the-counter or prescription medicines as directed by your health care provider.   Apply ice to the injured area.   Put ice in a plastic bag.   Place a towel between your skin and the bag.   Leave the ice on for 20 minutes, 2-3 times a day.  SEEK MEDICAL CARE IF:  You have bite problems.   You have pain with chewing.   You are concerned about facial defects. SEEK IMMEDIATE MEDICAL CARE IF:  You have severe pain or a headache that is not relieved by medicine.   You have unusual sleepiness, confusion, or personality changes.   You throw up (vomit).   You have a persistent nosebleed.   You have double vision or blurred vision.   You have fluid drainage from your nose or ear.   You have difficulty walking or using your arms or legs.  MAKE SURE YOU:    Understand these instructions.  Will watch your condition.  Will get help right away if you are not doing well or get worse.   This information is not intended to replace advice given to you by your health care provider. Make sure you discuss any questions you have with your health care provider.   Document Released: 05/28/2004 Document Revised: 05/11/2014 Document Reviewed: 12/01/2012 Elsevier Interactive Patient Education [employer].

## 2015-03-05 NOTE — ED Notes (Signed)
Spoke with Dr Wilson Singer regarding pt - CT head ordered.

## 2015-03-05 NOTE — Progress Notes (Signed)
Pre visit review using our clinic review tool, if applicable. No additional management support is needed unless otherwise documented below in the visit note. 

## 2015-03-05 NOTE — Telephone Encounter (Signed)
Per Dr Maudie Mercury I called [facility] and informed Delsa Sale the pt is on the way there now by car due to a fall yesterday and Dr Maudie Mercury wants her to be seen due to decreased vision in the right eye, headache and dizziness.  Delsa Sale stated if Dr Maudie Mercury is concerned about a head bleed she may want to send the pt to [facility] as there are no neurologists there.  Per Dr Maudie Mercury I called the pt and informed her of this and the pt stated she will go to [facility] and I called Jen back and left this message with Lattie Haw. I called Amy at [facility] and informed about the pt coming via car at this time.

## 2015-03-05 NOTE — ED Notes (Signed)
Pt states that she tripped on the dishwasher last night and fell and hit her head on the corner of the counter top. Denies LOC. States that she takes coumadin for a blood clot in her right leg. States that she went to her MD today and they referred her to the ED for a CT scan.

## 2015-03-05 NOTE — ED Provider Notes (Signed)
CSN: [id number]     Arrival date & time 03/05/15  1504 History  By signing my name below, I, [name], attest that this documentation has been prepared under the direction and in the presence of Jenny Quill, NP. Electronically Signed: [name], Scribe. 03/05/2015. 6:39 PM.  Chief Complaint  Patient presents with  . Fall     Patient is a 64 y.o. female presenting with fall. The history is provided by the patient. No language interpreter was used.  Fall This is a new problem. The current episode started yesterday. The problem occurs constantly. The problem has not changed since onset.Associated symptoms include headaches. Nothing aggravates the symptoms. The symptoms are relieved by ice. She has tried nothing for the symptoms.    HPI Comments: Jenny [name] is a 64 y.o. female with hx of DVT (currently taking anti-coagulant) who presents to the Emergency Department complaining of a mild HA and a small bump on the right-sided head s/p a mechanical fall last night around 7:30 PM. Pt reports leaping into the air and landing on her rug which slipped from underneath her causing her to hit her head on the counter top and her wrist and shin on the dishwasher.  She reports brief, transient lightheadedness immediately after the fall but denies LOC, back pain or neck pain.  She has tried ice with little releif.  Patient was referred to ED today for CT scan after being seen by her PCP for these complaints.  Pt reports hx of hypercholesteremia but denies history of DM or HTN.  She denies other complaints.    Past Medical History  Diagnosis Date  . DVT (deep venous thrombosis) ([place])     1 year ago  . Hypercholesteremia    Past Surgical History  Procedure Laterality Date  . Cesarean section     Family History  Problem Relation Age of Onset  . Stroke Mother   . Hypertension Mother   . Cancer Father     colon   Social History  Substance Use Topics  . Smoking status: Current  Every Day Smoker -- 1.00 packs/day for 39 years    Types: Cigarettes  . Smokeless tobacco: None  . Alcohol Use: No   OB History    No data available     Review of Systems  Musculoskeletal: Positive for arthralgias. Negative for back pain and neck pain.  Neurological: Positive for headaches.       Mild light headedness   All other systems reviewed and are negative.     Allergies  Morphine and related  Home Medications   Prior to Admission medications   Medication Sig Start Date End Date Taking? Authorizing Provider  albuterol (PROAIR HFA) 108 (90 BASE) MCG/ACT inhaler Inhale 2 puffs into the lungs every 4 (four) hours as needed for wheezing or shortness of breath. 02/22/14  Yes Eulas Post, MD  atorvastatin (LIPITOR) 10 MG tablet TAKE 1 TABLET BY MOUTH EVERY DAY Patient taking differently: TAKE 1 TABLET BY MOUTH EVERY DAY AT BEDTIME 06/18/14  Yes Eulas Post, MD  methocarbamol (ROBAXIN-750) 750 MG tablet Take 1 tablet (750 mg total) by mouth every 8 (eight) hours as needed for muscle spasms. 01/17/15  Yes Dorothyann Peng, NP  Vitamin D, Ergocalciferol, (DRISDOL) 50000 UNITS CAPS capsule TAKE ONE CAPSULE BY MOUTH ONCE A WEEK ON THURSDAYS 03/04/15  Yes Eulas Post, MD  warfarin (COUMADIN) 3 MG tablet Take as directed by anticoagulation clinic Patient taking differently:  Take 3 mg by mouth every Tuesday, Thursday, Saturday, and Sunday at 6 PM. Take as directed by anticoagulation clinic 07/12/14  Yes Kennyth Arnold, FNP  warfarin (COUMADIN) 5 MG tablet TAKE AS DIRECTED BY ANTICOAGULATION CLINIC Patient taking differently: TAKE 5 MG BY MOUTH DAILY ON MON, WED, AND FRIDAY AT [facility] 03/05/15  Yes Eulas Post, MD   BP 141/77 mmHg  Pulse 79  Temp(Src) 98.2 F (36.8 C) (Oral)  Resp 16  Ht '5\' 5"'$  (1.651 m)  Wt 142 lb (64.411 kg)  BMI 23.63 kg/m2  SpO2 98% Physical Exam  Constitutional: She is oriented to person, place, and time. She appears well-developed and  well-nourished. No distress.  HENT:  Head: Normocephalic.  Eyes: Conjunctivae are normal.  Cardiovascular: Normal rate.   Pulmonary/Chest: Effort normal.  Abdominal: She exhibits no distension.  Neurological: She is alert and oriented to person, place, and time.  Scalp contusion on right side of forehead Contusion to the right wrist Neurologically intact  Skin: Skin is warm and dry.  Psychiatric: She has a normal mood and affect.  Nursing note and vitals reviewed.   ED Course  Procedures  DIAGNOSTIC STUDIES:    Oxygen Saturation is 97% on room air, adequate by my interpretation.   COORDINATION OF CARE:  6:14 PM Advises pt to treat  HA with Tylenol and to return to the ED if HA and symptoms worsen. Will review CT.  Discussed treatment plan with pt at bedside and pt agreed to plan.   Labs Review Labs Reviewed - No data to display  Imaging Review Ct Head Wo Contrast  03/05/2015  CLINICAL DATA:  64 year old female slipped on rug. Hit right-side of head. Headache since fall. On Coumadin for blood clot. No reported loss of consciousness. Initial encounter. EXAM: CT HEAD WITHOUT CONTRAST TECHNIQUE: Contiguous axial images were obtained from the base of the skull through the vertex without intravenous contrast. COMPARISON:  None. FINDINGS: No skull fracture or intracranial hemorrhage. No CT evidence of large acute infarct. No intracranial mass lesion noted on this unenhanced exam. No hydrocephalus. Orbital structures unremarkable. Partially empty sella incidentally noted. Mastoid air cells, middle ear cavities and visualized paranasal sinuses are clear. IMPRESSION: No skull fracture or intracranial hemorrhage. Electronically Signed   By: Genia Del M.D.   On: 03/05/2015 17:01   I have personally reviewed and evaluated these images and lab results as part of my medical decision-making.   EKG Interpretation None     Patient is on blood thinners and was sent to ED for CT scan of head.  Radiology results reviewed and shared with patient. No acute findings. Patient discharged home with return precautions discussed. Follow-up with PCP. MDM   Final diagnoses:  Scalp contusion, initial encounter  Fall at home, initial encounter      .I personally performed the services described in this documentation, which was scribed in my presence. The recorded information has been reviewed and is accurate.    Jenny Quill, NP 03/05/15 [id number]  Julianne Rice, MD 03/09/15 0120

## 2015-03-05 NOTE — Progress Notes (Signed)
HPI:  Jenny [name] is a 64 yo F on anticoagulation with coumadin for DVT here for an acute visit for head pain. She report she tripped over her dishwasher yesterday and had the hardest fall she has ever had in her life and hit her head on the R frontal/parietal region on granite. She suffered a large swelling over this area and has been applying ice. Today she has developed a headache, a "heavy feeling in her head", dizziness intermittently and blurred vision in the R eye. She denies syncope, bleeding, SOB, AMS, other falls since. She also has R thumb pain and R shin pain.  ROS: See pertinent positives and negatives per HPI.  Past Medical History  Diagnosis Date  . DVT (deep venous thrombosis) ([place])     1 year ago  . Hypercholesteremia     Past Surgical History  Procedure Laterality Date  . Cesarean section      Family History  Problem Relation Age of Onset  . Stroke Mother   . Hypertension Mother   . Cancer Father     colon    Social History   Social History  . Marital Status: Married    Spouse Name: N/A  . Number of Children: N/A  . Years of Education: N/A   Social History Main Topics  . Smoking status: Current Every Day Smoker -- 0.50 packs/day for 39 years    Types: Cigarettes  . Smokeless tobacco: None  . Alcohol Use: No  . Drug Use: No  . Sexual Activity: No   Other Topics Concern  . None   Social History Narrative     Current outpatient prescriptions:  .  albuterol (PROAIR HFA) 108 (90 BASE) MCG/ACT inhaler, Inhale 2 puffs into the lungs every 4 (four) hours as needed for wheezing or shortness of breath., Disp: 1 Inhaler, Rfl: 2 .  atorvastatin (LIPITOR) 10 MG tablet, TAKE 1 TABLET BY MOUTH EVERY DAY, Disp: 90 tablet, Rfl: 1 .  methocarbamol (ROBAXIN-750) 750 MG tablet, Take 1 tablet (750 mg total) by mouth every 8 (eight) hours as needed for muscle spasms., Disp: 30 tablet, Rfl: 0 .  Vitamin D, Ergocalciferol, (DRISDOL) 50000 UNITS CAPS capsule, TAKE  ONE CAPSULE BY MOUTH ONCE A WEEK ON THURSDAYS, Disp: 12 capsule, Rfl: 2 .  warfarin (COUMADIN) 3 MG tablet, Take as directed by anticoagulation clinic, Disp: 30 tablet, Rfl: 2 .  warfarin (COUMADIN) 5 MG tablet, TAKE AS DIRECTED BY ANTICOAGULATION CLINIC, Disp: 45 tablet, Rfl: 2  EXAM:  Filed Vitals:   03/05/15 1405  BP: 130/88  Pulse: 93  Temp: 98.2 F (36.8 C)    Body mass index is 23.7 kg/(m^2).  GENERAL: vitals reviewed and listed above, alert, oriented, appears well hydrated and in no acute distress  HEENT: Hematoma over R upper frontal region with sig TTP extending from R frontal to R parietal region, conjunttiva clear, PERRLA, visual acuity decreased on the R, difficulty lifting R eye brow - reports no pain with this - but unable to lift, no obvious abnormalities on inspection of external nose and ears  NECK: no obvious masses on inspection, no bony TTP, normal ROM  LUNGS: clear to auscultation bilaterally, no wheezes, rales or rhonchi, good air movement  CV: HRRR, no peripheral edema  MS: moves all extremities without noticeable abnormality  PSYCH: pleasant and cooperative, no obvious depression or anxiety  CN: CN II-XII grossly intact except for unable to life R eyebrow and decreased visual acuity R; gait normal,  speech and thought processing grossly intact, finger to nose normal  ASSESSMENT AND PLAN:  Discussed the following assessment and plan:  Acute post-traumatic headache, not intractable  Vision changes  Anticoagulant long-term use  -advised eval in emergency department with prompt imaging given on blood thinner and development of concerning symptoms following a significant fall and head injury -she refused EMS transport but agreed to go straight to the ED -advised staff to notify ED personel  -Patient advised to return or notify a doctor immediately if symptoms worsen or persist or new concerns arise.  There are no Patient Instructions on file for this  visit.   Colin Benton R.

## 2015-04-10 ENCOUNTER — Encounter: Payer: Self-pay | Admitting: Family Medicine

## 2015-04-10 ENCOUNTER — Ambulatory Visit (INDEPENDENT_AMBULATORY_CARE_PROVIDER_SITE_OTHER): Payer: BLUE CROSS/BLUE SHIELD | Admitting: Family Medicine

## 2015-04-10 VITALS — BP 117/77 | HR 93 | Temp 98.3°F | Ht 65.0 in | Wt 143.0 lb

## 2015-04-10 DIAGNOSIS — J019 Acute sinusitis, unspecified: Secondary | ICD-10-CM

## 2015-04-10 MED ORDER — HYDROCODONE-HOMATROPINE 5-1.5 MG/5ML PO SYRP: 5.0000 mL | ORAL_SOLUTION | ORAL | Status: DC | PRN

## 2015-04-10 MED ORDER — AMOXICILLIN-POT CLAVULANATE 875-125 MG PO TABS: 1.0000 | ORAL_TABLET | Freq: Two times a day (BID) | ORAL | Status: DC

## 2015-04-10 NOTE — Progress Notes (Signed)
Pre visit review using our clinic review tool, if applicable. No additional management support is needed unless otherwise documented below in the visit note. 

## 2015-04-10 NOTE — Progress Notes (Signed)
   Subjective:    Patient ID: Jenny [name], female    DOB: [birth year], 64 y.o.   MRN: [id number]  HPI Here for one week of sinus pressure, PND, and coughing up yellow sputum. No fever. Drinking fluids.   Review of Systems  Constitutional: Negative.   HENT: Positive for congestion, postnasal drip and sinus pressure. Negative for ear pain and sore throat.   Eyes: Negative.   Respiratory: Positive for cough.        Objective:   Physical Exam  Constitutional: She appears well-developed and well-nourished.  HENT:  Right Ear: External ear normal.  Left Ear: External ear normal.  Nose: Nose normal.  Mouth/Throat: Oropharynx is clear and moist.  Eyes: Conjunctivae are normal.  Neck: No thyromegaly present.  Pulmonary/Chest: Effort normal and breath sounds normal.  Lymphadenopathy:    She has no cervical adenopathy.          Assessment & Plan:  Sinusitis, treat with Augmentin.

## 2015-04-11 ENCOUNTER — Ambulatory Visit: Payer: BLUE CROSS/BLUE SHIELD

## 2015-04-18 ENCOUNTER — Encounter: Payer: Self-pay | Admitting: Family Medicine

## 2015-04-18 ENCOUNTER — Ambulatory Visit (INDEPENDENT_AMBULATORY_CARE_PROVIDER_SITE_OTHER): Payer: BLUE CROSS/BLUE SHIELD | Admitting: Family Medicine

## 2015-04-18 ENCOUNTER — Ambulatory Visit (INDEPENDENT_AMBULATORY_CARE_PROVIDER_SITE_OTHER): Payer: BLUE CROSS/BLUE SHIELD | Admitting: General Practice

## 2015-04-18 VITALS — BP 108/86 | HR 102 | Temp 98.5°F | Resp 14 | Ht 64.25 in | Wt 141.7 lb

## 2015-04-18 DIAGNOSIS — Z5181 Encounter for therapeutic drug level monitoring: Secondary | ICD-10-CM | POA: Diagnosis not present

## 2015-04-18 DIAGNOSIS — R300 Dysuria: Secondary | ICD-10-CM

## 2015-04-18 LAB — POCT URINALYSIS DIPSTICK
Bilirubin, UA: NEGATIVE
Glucose, UA: NEGATIVE
Nitrite, UA: NEGATIVE
PH UA: 5
PROTEIN UA: NEGATIVE
SPEC GRAV UA: 1.015
Urobilinogen, UA: 0.2

## 2015-04-18 LAB — POCT INR: INR: 2.5

## 2015-04-18 MED ORDER — NITROFURANTOIN MONOHYD MACRO 100 MG PO CAPS: 100.0000 mg | ORAL_CAPSULE | Freq: Two times a day (BID) | ORAL | Status: DC

## 2015-04-18 NOTE — Progress Notes (Signed)
   Subjective:    Patient ID: Jenny [name], female    DOB: 1950/05/16, 64 y.o.   MRN: [id number]  HPI Acute visit. 2 day history of urine frequency and burning with urination. Symptoms slightly improved today. She just finished amoxicillin for URI. Those symptoms are improved. She denies any associated back pain, fever, chills, nausea, or vomiting.  Symptoms are relatively mild. Denies any history of UTI previously  No known drug allergies. 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 No recent bleeding complications  Past Medical History  Diagnosis Date  . DVT (deep venous thrombosis) ([place])     1 year ago  . Hypercholesteremia    Past Surgical History  Procedure Laterality Date  . Cesarean section      reports that she has been smoking Cigarettes.  She has a 39 pack-year smoking history. She does not have any smokeless tobacco history on file. She reports that she does not drink alcohol or use illicit drugs. family history includes Cancer in her father; Hypertension in her mother; Stroke in her mother. Allergies  Allergen Reactions  . Morphine And Related Rash      Review of Systems  Constitutional: Negative for fever and chills.  Genitourinary: Positive for dysuria and frequency. Negative for hematuria.  Hematological: Does not bruise/bleed easily.       Objective:   Physical Exam  Constitutional: She appears well-developed and well-nourished.  Cardiovascular: Normal rate and regular rhythm.   Pulmonary/Chest: Effort normal and breath sounds normal. No respiratory distress. She has no wheezes. She has no rales.  Neurological: She is alert.          Assessment & Plan:  Dysuria. Suspect uncomplicated cystitis. Urine culture sent. Macrobid 1 twice a day for 5 days.

## 2015-04-18 NOTE — Progress Notes (Signed)
Pre visit review using our clinic review tool, if applicable. No additional management support is needed unless otherwise documented below in the visit note. 

## 2015-04-18 NOTE — Patient Instructions (Signed)

## 2015-04-21 LAB — URINE CULTURE: Colony Count: 15000

## 2015-05-18 ENCOUNTER — Other Ambulatory Visit: Payer: Self-pay | Admitting: Family Medicine

## 2015-05-30 ENCOUNTER — Ambulatory Visit: Payer: BLUE CROSS/BLUE SHIELD

## 2015-06-05 ENCOUNTER — Ambulatory Visit: Payer: BLUE CROSS/BLUE SHIELD

## 2015-06-10 ENCOUNTER — Other Ambulatory Visit: Payer: Self-pay | Admitting: Family

## 2015-07-01 ENCOUNTER — Other Ambulatory Visit: Payer: Self-pay | Admitting: Family Medicine

## 2015-07-16 ENCOUNTER — Telehealth: Payer: Self-pay | Admitting: Family Medicine

## 2015-07-16 NOTE — Telephone Encounter (Signed)
Pt states she is not coping very well at this time. Her 63 yr old son had an accident at work and had to have his   hand amputated.Son lives with them.  Pt states she knows her bp is up, and she would like a referral to someone that may can help her through this time. No appts today, but only same day appontments tomorrow. Is it ok if I use one of the same days tomorrow?

## 2015-07-16 NOTE — Telephone Encounter (Signed)
yes

## 2015-07-17 ENCOUNTER — Ambulatory Visit (INDEPENDENT_AMBULATORY_CARE_PROVIDER_SITE_OTHER): Payer: BLUE CROSS/BLUE SHIELD | Admitting: Family Medicine

## 2015-07-17 VITALS — BP 122/80 | HR 102 | Temp 98.5°F | Ht 64.25 in | Wt 138.0 lb

## 2015-07-17 DIAGNOSIS — F418 Other specified anxiety disorders: Secondary | ICD-10-CM

## 2015-07-17 MED ORDER — ALBUTEROL SULFATE HFA 108 (90 BASE) MCG/ACT IN AERS: 2.0000 | INHALATION_SPRAY | RESPIRATORY_TRACT | Status: DC | PRN

## 2015-07-17 NOTE — Progress Notes (Signed)
Pre visit review using our clinic review tool, if applicable. No additional management support is needed unless otherwise documented below in the visit note. 

## 2015-07-17 NOTE — Progress Notes (Signed)
   Subjective:    Patient ID: Jenny [name], female    DOB: December 28, 1950, 65 y.o.   MRN: [id number]  HPI  patient seen for increased anxiety symptoms. Her   Son had recent injury with amputation right thumb and most of his index finger. This has been extremely stressful for her. She has been involved in much of his care. She has daily anxiety symptoms and some insomnia issues. She feels this is more anxiety related and depression. She has not sought counseling yet but is considered. She also is getting her son involved with counseling to deal with this.  Denies any prior history of depression  Past Medical History  Diagnosis Date  . DVT (deep venous thrombosis) ([place])     1 year ago  . Hypercholesteremia    Past Surgical History  Procedure Laterality Date  . Cesarean section      reports that she has been smoking Cigarettes.  She has a 39 pack-year smoking history. She does not have any smokeless tobacco history on file. She reports that she does not drink alcohol or use illicit drugs. family history includes Cancer in her father; Hypertension in her mother; Stroke in her mother. Allergies  Allergen Reactions  . Morphine And Related Rash      Review of Systems  Constitutional: Negative for appetite change and unexpected weight change.  Cardiovascular: Negative for chest pain.  Neurological: Negative for headaches.  Psychiatric/Behavioral: Positive for sleep disturbance and dysphoric mood. Negative for suicidal ideas, confusion and agitation. The patient is nervous/anxious.        Objective:   Physical Exam  Constitutional: She appears well-developed and well-nourished.  Cardiovascular: Normal rate and regular rhythm.   Pulmonary/Chest: Effort normal and breath sounds normal. No respiratory distress. She has no wheezes. She has no rales.  Musculoskeletal: She exhibits no edema.  Psychiatric: She has a normal mood and affect. Her behavior is normal.          Assessment &  Plan:   situational stress/anxiety. We've advised counseling and brochure given. She will consider setting this up. We discussed possible pharmacologic intervention with SSRI but at this point she wishes to wait. She will try counseling first

## 2015-08-05 ENCOUNTER — Ambulatory Visit (INDEPENDENT_AMBULATORY_CARE_PROVIDER_SITE_OTHER): Payer: BLUE CROSS/BLUE SHIELD | Admitting: Psychology

## 2015-08-05 DIAGNOSIS — F4323 Adjustment disorder with mixed anxiety and depressed mood: Secondary | ICD-10-CM

## 2015-08-08 ENCOUNTER — Ambulatory Visit (INDEPENDENT_AMBULATORY_CARE_PROVIDER_SITE_OTHER): Payer: BLUE CROSS/BLUE SHIELD | Admitting: Family Medicine

## 2015-08-08 VITALS — BP 120/80 | HR 77 | Temp 98.7°F | Ht 64.25 in | Wt 138.0 lb

## 2015-08-08 DIAGNOSIS — J019 Acute sinusitis, unspecified: Secondary | ICD-10-CM

## 2015-08-08 MED ORDER — DOXYCYCLINE HYCLATE 100 MG PO CAPS: 100.0000 mg | ORAL_CAPSULE | Freq: Two times a day (BID) | ORAL | Status: DC

## 2015-08-08 MED ORDER — PREDNISONE 20 MG PO TABS: ORAL_TABLET | ORAL | Status: DC

## 2015-08-08 NOTE — Progress Notes (Signed)
   Subjective:    Patient ID: Jenny [name], female    DOB: 04-03-1951, 65 y.o.   MRN: [id number]  HPI  patient seen for acute visit   She describes almost 2 week history of cough. Frequently productive early in the morning. Productive of yellow sputum. Frequent postnasal drip symptoms. Yellowish nasal discharge. Intermittent headaches and malaise.  Not relieved with over-the-counter medications and decongestant. She's taken Mucinex D. Intermittent wheezing. Uses pro-air as needed. Smoker  Denies any fever or chills. No hemoptysis. No dyspnea at rest.  Past Medical History  Diagnosis Date  . DVT (deep venous thrombosis) ([place])     1 year ago  . Hypercholesteremia    Past Surgical History  Procedure Laterality Date  . Cesarean section      reports that she has been smoking Cigarettes.  She has a 39 pack-year smoking history. She does not have any smokeless tobacco history on file. She reports that she does not drink alcohol or use illicit drugs. family history includes Cancer in her father; Hypertension in her mother; Stroke in her mother. Allergies  Allergen Reactions  . Morphine And Related Rash      Review of Systems  Constitutional: Negative for fever and chills.  HENT: Positive for congestion and sinus pressure.   Respiratory: Positive for cough and wheezing. Negative for shortness of breath.        Objective:   Physical Exam  Constitutional: She appears well-developed and well-nourished.  HENT:  Right Ear: External ear normal.  Left Ear: External ear normal.  Mouth/Throat: Oropharynx is clear and moist.  Neck: Neck supple.  Cardiovascular: Normal rate and regular rhythm.   Pulmonary/Chest: Effort normal and breath sounds normal. No respiratory distress. She has no wheezes. She has no rales.  Lymphadenopathy:    She has no cervical adenopathy.          Assessment & Plan:   Upper respiratory infection. Probable acute sinusitis. Doxycycline 100 mg twice daily  for 10 days. Prednisone 40 mg daily for 5 days. Stay well hydrated. Encouraged to stop smoking. Needs pro time assessment.

## 2015-08-08 NOTE — Progress Notes (Signed)
Pre visit review using our clinic review tool, if applicable. No additional management support is needed unless otherwise documented below in the visit note. 

## 2015-08-08 NOTE — Patient Instructions (Signed)
Sinusitis, Adult Sinusitis is redness, soreness, and inflammation of the paranasal sinuses. Paranasal sinuses are air pockets within the bones of your face. They are located beneath your eyes, in the middle of your forehead, and above your eyes. In healthy paranasal sinuses, mucus is able to drain out, and air is able to circulate through them by way of your nose. However, when your paranasal sinuses are inflamed, mucus and air can become trapped. This can allow bacteria and other germs to grow and cause infection. Sinusitis can develop quickly and last only a short time (acute) or continue over a long period (chronic). Sinusitis that lasts for more than 12 weeks is considered chronic. CAUSES Causes of sinusitis include:  Allergies.  Structural abnormalities, such as displacement of the cartilage that separates your nostrils (deviated septum), which can decrease the air flow through your nose and sinuses and affect sinus drainage.  Functional abnormalities, such as when the small hairs (cilia) that line your sinuses and help remove mucus do not work properly or are not present. SIGNS AND SYMPTOMS Symptoms of acute and chronic sinusitis are the same. The primary symptoms are pain and pressure around the affected sinuses. Other symptoms include:  Upper toothache.  Earache.  Headache.  Bad breath.  Decreased sense of smell and taste.  A cough, which worsens when you are lying flat.  Fatigue.  Fever.  Thick drainage from your nose, which often is green and may contain pus (purulent).  Swelling and warmth over the affected sinuses. DIAGNOSIS Your health care provider will perform a physical exam. During your exam, your health care provider may perform any of the following to help determine if you have acute sinusitis or chronic sinusitis:  Look in your nose for signs of abnormal growths in your nostrils (nasal polyps).  Tap over the affected sinus to check for signs of  infection.  View the inside of your sinuses using an imaging device that has a light attached (endoscope). If your health care provider suspects that you have chronic sinusitis, one or more of the following tests may be recommended:  Allergy tests.  Nasal culture. A sample of mucus is taken from your nose, sent to a lab, and screened for bacteria.  Nasal cytology. A sample of mucus is taken from your nose and examined by your health care provider to determine if your sinusitis is related to an allergy. TREATMENT Most cases of acute sinusitis are related to a viral infection and will resolve on their own within 10 days. Sometimes, medicines are prescribed to help relieve symptoms of both acute and chronic sinusitis. These may include pain medicines, decongestants, nasal steroid sprays, or saline sprays. However, for sinusitis related to a bacterial infection, your health care provider will prescribe antibiotic medicines. These are medicines that will help kill the bacteria causing the infection. Rarely, sinusitis is caused by a fungal infection. In these cases, your health care provider will prescribe antifungal medicine. For some cases of chronic sinusitis, surgery is needed. Generally, these are cases in which sinusitis recurs more than 3 times per year, despite other treatments. HOME CARE INSTRUCTIONS  Drink plenty of water. Water helps thin the mucus so your sinuses can drain more easily.  Use a humidifier.  Inhale steam 3-4 times a day (for example, sit in the bathroom with the shower running).  Apply a warm, moist washcloth to your face 3-4 times a day, or as directed by your health care provider.  Use saline nasal sprays to help   moisten and clean your sinuses.  Take medicines only as directed by your health care provider.  If you were prescribed either an antibiotic or antifungal medicine, finish it all even if you start to feel better. SEEK IMMEDIATE MEDICAL CARE IF:  You have  increasing pain or severe headaches.  You have nausea, vomiting, or drowsiness.  You have swelling around your face.  You have vision problems.  You have a stiff neck.  You have difficulty breathing.   This information is not intended to replace advice given to you by your health care provider. Make sure you discuss any questions you have with your health care provider.   Document Released: 04/20/2005 Document Revised: 05/11/2014 Document Reviewed: 05/05/2011 Elsevier Interactive Patient Education [employer].  Be sure to get protime in the next week.

## 2015-08-19 ENCOUNTER — Encounter (HOSPITAL_COMMUNITY): Payer: Self-pay | Admitting: Emergency Medicine

## 2015-08-19 ENCOUNTER — Ambulatory Visit (INDEPENDENT_AMBULATORY_CARE_PROVIDER_SITE_OTHER): Payer: BLUE CROSS/BLUE SHIELD | Admitting: General Practice

## 2015-08-19 ENCOUNTER — Ambulatory Visit (INDEPENDENT_AMBULATORY_CARE_PROVIDER_SITE_OTHER): Payer: BLUE CROSS/BLUE SHIELD | Admitting: Family Medicine

## 2015-08-19 ENCOUNTER — Telehealth: Payer: Self-pay | Admitting: Family Medicine

## 2015-08-19 ENCOUNTER — Emergency Department (HOSPITAL_COMMUNITY)
Admission: EM | Admit: 2015-08-19 | Discharge: 2015-08-20 | Disposition: A | Discharge status: Home / Self Care | Payer: BLUE CROSS/BLUE SHIELD | Attending: Emergency Medicine | Admitting: Emergency Medicine

## 2015-08-19 ENCOUNTER — Emergency Department (HOSPITAL_COMMUNITY): Payer: BLUE CROSS/BLUE SHIELD

## 2015-08-19 VITALS — BP 108/82 | HR 115 | Temp 98.7°F | Ht 64.25 in | Wt 135.0 lb

## 2015-08-19 DIAGNOSIS — R0789 Other chest pain: Secondary | ICD-10-CM | POA: Insufficient documentation

## 2015-08-19 DIAGNOSIS — Z7901 Long term (current) use of anticoagulants: Secondary | ICD-10-CM | POA: Insufficient documentation

## 2015-08-19 DIAGNOSIS — R000 Tachycardia, unspecified: Secondary | ICD-10-CM

## 2015-08-19 DIAGNOSIS — I82409 Acute embolism and thrombosis of unspecified deep veins of unspecified lower extremity: Secondary | ICD-10-CM

## 2015-08-19 DIAGNOSIS — R748 Abnormal levels of other serum enzymes: Secondary | ICD-10-CM | POA: Insufficient documentation

## 2015-08-19 DIAGNOSIS — F17210 Nicotine dependence, cigarettes, uncomplicated: Secondary | ICD-10-CM | POA: Insufficient documentation

## 2015-08-19 DIAGNOSIS — Z5181 Encounter for therapeutic drug level monitoring: Secondary | ICD-10-CM | POA: Diagnosis not present

## 2015-08-19 DIAGNOSIS — R042 Hemoptysis: Secondary | ICD-10-CM | POA: Insufficient documentation

## 2015-08-19 DIAGNOSIS — R791 Abnormal coagulation profile: Secondary | ICD-10-CM

## 2015-08-19 DIAGNOSIS — Z86718 Personal history of other venous thrombosis and embolism: Secondary | ICD-10-CM | POA: Insufficient documentation

## 2015-08-19 DIAGNOSIS — E7800 Pure hypercholesterolemia, unspecified: Secondary | ICD-10-CM | POA: Diagnosis not present

## 2015-08-19 DIAGNOSIS — R1012 Left upper quadrant pain: Secondary | ICD-10-CM | POA: Diagnosis not present

## 2015-08-19 DIAGNOSIS — R079 Chest pain, unspecified: Secondary | ICD-10-CM | POA: Diagnosis present

## 2015-08-19 DIAGNOSIS — R040 Epistaxis: Secondary | ICD-10-CM | POA: Diagnosis not present

## 2015-08-19 DIAGNOSIS — M542 Cervicalgia: Secondary | ICD-10-CM | POA: Insufficient documentation

## 2015-08-19 DIAGNOSIS — Z79899 Other long term (current) drug therapy: Secondary | ICD-10-CM | POA: Insufficient documentation

## 2015-08-19 DIAGNOSIS — R918 Other nonspecific abnormal finding of lung field: Secondary | ICD-10-CM | POA: Diagnosis not present

## 2015-08-19 LAB — CBC
HCT: 40.3 % (ref 36.0–46.0)
Hemoglobin: 13.4 g/dL (ref 12.0–15.0)
MCH: 27.7 pg (ref 26.0–34.0)
MCHC: 33.3 g/dL (ref 30.0–36.0)
MCV: 83.3 fL (ref 78.0–100.0)
Platelets: 197 K/uL (ref 150–400)
RBC: 4.84 MIL/uL (ref 3.87–5.11)
RDW: 14.9 % (ref 11.5–15.5)
WBC: 8.7 K/uL (ref 4.0–10.5)

## 2015-08-19 LAB — COMPREHENSIVE METABOLIC PANEL
ALBUMIN: 4.1 g/dL (ref 3.5–5.0)
ALK PHOS: 59 U/L (ref 38–126)
ALT: 16 U/L (ref 14–54)
ANION GAP: 8 (ref 5–15)
AST: 18 U/L (ref 15–41)
BUN: 15 mg/dL (ref 6–20)
CALCIUM: 9.3 mg/dL (ref 8.9–10.3)
CHLORIDE: 110 mmol/L (ref 101–111)
CO2: 26 mmol/L (ref 22–32)
CREATININE: 0.91 mg/dL (ref 0.44–1.00)
GFR calc Af Amer: >60 mL/min (ref >60)
GFR calc non Af Amer: >60 mL/min (ref >60)
GLUCOSE: 123 mg/dL — AB (ref 65–99)
Potassium: 4.1 mmol/L (ref 3.5–5.1)
SODIUM: 144 mmol/L (ref 135–145)
Total Bilirubin: 0.3 mg/dL (ref 0.3–1.2)
Total Protein: 7 g/dL (ref 6.5–8.1)

## 2015-08-19 LAB — URINALYSIS, ROUTINE W REFLEX MICROSCOPIC
Bilirubin Urine: NEGATIVE
Glucose, UA: NEGATIVE mg/dL
Ketones, ur: NEGATIVE mg/dL
Nitrite: NEGATIVE
Protein, ur: NEGATIVE mg/dL
Specific Gravity, Urine: 1.017 (ref 1.005–1.030)
pH: 5.5 (ref 5.0–8.0)

## 2015-08-19 LAB — POCT INR: INR: 3.4

## 2015-08-19 LAB — URINE MICROSCOPIC-ADD ON

## 2015-08-19 LAB — LIPASE, BLOOD: LIPASE: 99 U/L — AB (ref 11–51)

## 2015-08-19 NOTE — Progress Notes (Signed)
Pre visit review using our clinic review tool, if applicable. No additional management support is needed unless otherwise documented below in the visit note. 

## 2015-08-19 NOTE — Patient Instructions (Signed)
Suspect abdominal muscle strain Tylenol as needed Follow up for any new or worsening symptoms.

## 2015-08-19 NOTE — ED Notes (Signed)
Pt stated she went to see her primary care doctor for cough and abdominal pain.  She reports coughing up blood and blowing her nose with blood noted today.  Cough started Friday.  Pt is recently getting over a sinus infection of which she was prescribed prednisone and doxycycline (treatment completed).  Denies N&V.  Denies any night sweats or weight loss. Pt also reports DVT in right leg and is receiving warfarin as treatment.

## 2015-08-19 NOTE — Progress Notes (Signed)
   Subjective:    Patient ID: Jenny [name], female    DOB: 19-Nov-1950, 65 y.o.   MRN: [id number]  HPI  Patient seen with complaints of upper abdominal pain.  She had recent respiratory illness with cough.  Last Friday, she recalls particularly severe cough followed by some pain just left of midline upper abdomen. She has pain and soreness now only with cough or sneeze or sudden movement.  She denies any appetite or weight changes. No nausea or vomiting. No fevers or chills. No stool changes. Pain exacerbated as above.  No pain whatsoever with rest.   Patient also notes that she has somewhat elevated pulse at baseline. She has not had any appetite or weight changes. No tremors. No diarrhea. No chest pains. Significant caffeine use but is trying to gradually scale back. She has past history of recurrent DVT and is on chronic Coumadin. She does not take any decongestants or other medications that would likely be raising her pulse.  Past Medical History  Diagnosis Date  . DVT (deep venous thrombosis) ([place])     1 year ago  . Hypercholesteremia    Past Surgical History  Procedure Laterality Date  . Cesarean section      reports that she has been smoking Cigarettes.  She has a 39 pack-year smoking history. She does not have any smokeless tobacco history on file. She reports that she does not drink alcohol or use illicit drugs. family history includes Cancer in her father; Hypertension in her mother; Stroke in her mother. Allergies  Allergen Reactions  . Morphine And Related Rash      Review of Systems  Constitutional: Negative for fever, chills, appetite change and unexpected weight change.  Respiratory: Negative for shortness of breath.   Cardiovascular: Negative for chest pain, palpitations and leg swelling.  Gastrointestinal: Negative for nausea, vomiting, diarrhea, blood in stool and abdominal distention.  Neurological: Negative for dizziness.  Hematological: Negative for  adenopathy.       Objective:   Physical Exam  Constitutional: She appears well-developed and well-nourished.  HENT:  Mouth/Throat: Oropharynx is clear and moist.  Neck: Neck supple. No thyromegaly present.  Cardiovascular: Regular rhythm.   Pulmonary/Chest: Effort normal and breath sounds normal. No respiratory distress. She has no wheezes. She has no rales.  Abdominal: Soft. Bowel sounds are normal. She exhibits no distension and no mass. There is no tenderness. There is no rebound and no guarding.  Musculoskeletal: She exhibits no edema.  Lymphadenopathy:    She has no cervical adenopathy.          Assessment & Plan:   #1 upper abdominal pain. Onset following severe episode of cough. The fact that her pain is only with cough suggests likely muscle strain. No worrisome signs or symptoms. Observe for now. Avoid Advil or Aleve with Coumadin use. She will try Tylenol as needed. Follow-up immediately for any change of symptoms  She has absolutely no signs or symptoms to suggest infectious origin or visceral origin   #2 intermittent elevated pulse at baseline. Current pulse today on recheck 92. She does not have any symptoms of overt hyperthyroidism. We discussed TSH but at this point she wishes to wait. Start first with gradually reducing caffeine intake.

## 2015-08-19 NOTE — ED Provider Notes (Signed)
CSN: [id number]     Arrival date & time 08/19/15  1913 History  By signing my name below, I, Eustaquio Maize, attest that this documentation has been prepared under the direction and in the presence of Delora Fuel, MD. Electronically Signed: Eustaquio Maize, ED Scribe. 08/19/2015. 11:43 PM.   Chief Complaint  Patient presents with  . Abdominal Pain  . Hemoptysis   The history is provided by the patient. No language interpreter was used.     HPI Comments: Jenny [name] is a 65 y.o. female who presents to the Emergency Department complaining of sudden onset, intermittent, substernal pain x 4 days. Pt reports that her pain is only present immediately after coughing and sneezing. Pt has been coughing for the past couple of weeks. She was seen by her PCP, Dr. Elease Hashimoto, a couple of weeks ago and was diagnosed with a sinus infection. Pt was prescribed Doxycycline and Prednisone with some relief from the coughing. Pt is still having a mild productive cough with white phlegm. She reports that after dinner tonight she had an episode of bright red hemoptysis, prompting her to come to the ED. She also had nasal congestion tonight and when she blew her nose she noted blood on the tissue paper. Pt currently also complains of mid back pain and an inability to take a deep breath. Denies fever, chills, sweats, nausea, vomiting, or any other associated symptoms. Pt had a DVT in her right leg approximately 1 year ago and is currently on Warfarin.   Past Medical History  Diagnosis Date  . DVT (deep venous thrombosis) ([place])     1 year ago  . Hypercholesteremia    Past Surgical History  Procedure Laterality Date  . Cesarean section     Family History  Problem Relation Age of Onset  . Stroke Mother   . Hypertension Mother   . Cancer Father     colon   Social History  Substance Use Topics  . Smoking status: Current Every Day Smoker -- 1.00 packs/day for 39 years    Types: Cigarettes  . Smokeless tobacco:  Not on file  . Alcohol Use: No   OB History    No data available     Review of Systems  Constitutional: Negative for fever, chills and diaphoresis.  Respiratory: Positive for cough.   Gastrointestinal: Negative for nausea and vomiting.  Musculoskeletal: Positive for back pain and arthralgias (substernal pain).  All other systems reviewed and are negative.  Allergies  Morphine and related  Home Medications   Prior to Admission medications   Medication Sig Start Date End Date Taking? Authorizing Provider  albuterol (PROAIR HFA) 108 (90 Base) MCG/ACT inhaler Inhale 2 puffs into the lungs every 4 (four) hours as needed for wheezing or shortness of breath. 07/17/15  Yes Eulas Post, MD  atorvastatin (LIPITOR) 10 MG tablet TAKE 1 TABLET BY MOUTH EVERY DAY Patient taking differently: TAKE 10 MG BY MOUTH EVERY DAY 05/20/15  Yes Eulas Post, MD  Vitamin D, Ergocalciferol, (DRISDOL) 50000 UNITS CAPS capsule TAKE ONE CAPSULE BY MOUTH ONCE A WEEK ON THURSDAYS 03/04/15  Yes Eulas Post, MD  warfarin (COUMADIN) 3 MG tablet TAKE AS DIRECTED BY ANTICOAGULATION CLINIC Patient taking differently: Take '3mg'$  in addition to 5 mg by mouth tuesday, thursday, saturday, sunday 06/10/15  Yes Eulas Post, MD  warfarin (COUMADIN) 5 MG tablet TAKE AS [place] Patient taking differently: Take 5 mg by mouth monday, wednesday, and friday  07/02/15  Yes Eulas Post, MD   BP 111/79 mmHg  Pulse 98  Temp(Src) 98.3 F (36.8 C) (Oral)  Resp 17  Ht '5\' 4"'$  (1.626 m)  Wt 134 lb (60.782 kg)  BMI 22.99 kg/m2  SpO2 95%   Physical Exam  Constitutional: She is oriented to person, place, and time. She appears well-developed and well-nourished. No distress.  HENT:  Head: Normocephalic and atraumatic.  Site of recent bleeding identified right side of nasal septum. No active bleeding.   Eyes: Conjunctivae and EOM are normal. Pupils are equal, round, and reactive to light.   Neck: Normal range of motion. Neck supple. No JVD present.  Cardiovascular: Normal rate, regular rhythm and normal heart sounds.   No murmur heard. Pulmonary/Chest: Effort normal. She has no wheezes. She has no rales. She exhibits tenderness.  Slightly prolonged exhalation phase.  Point tenderness over the xyphoid and junction of the left inferior costal margin with the xyphoid.   Abdominal: Soft. Bowel sounds are normal. She exhibits no distension and no mass. There is no tenderness.  Musculoskeletal: Normal range of motion. She exhibits no edema.  Lymphadenopathy:    She has no cervical adenopathy.  Neurological: She is alert and oriented to person, place, and time. No cranial nerve deficit. She exhibits normal muscle tone. Coordination normal.  Skin: Skin is warm and dry.  Psychiatric: She has a normal mood and affect. Her behavior is normal.  Nursing note and vitals reviewed.   ED Course  Procedures (including critical care time)  DIAGNOSTIC STUDIES: Oxygen Saturation is 95% on RA, adequate by my interpretation.    COORDINATION OF CARE: 11:32 PM-Discussed treatment plan which includes CXR with pt at bedside and pt agreed to plan.   Labs Review Results for orders placed or performed during the hospital encounter of 08/19/15  Lipase, blood  Result Value Ref Range   Lipase 99 (H) 11 - 51 U/L  Comprehensive metabolic panel  Result Value Ref Range   Sodium 144 135 - 145 mmol/L   Potassium 4.1 3.5 - 5.1 mmol/L   Chloride 110 101 - 111 mmol/L   CO2 26 22 - 32 mmol/L   Glucose, Bld 123 (H) 65 - 99 mg/dL   BUN 15 6 - 20 mg/dL   Creatinine, Ser 0.91 0.44 - 1.00 mg/dL   Calcium 9.3 8.9 - 10.3 mg/dL   Total Protein 7.0 6.5 - 8.1 g/dL   Albumin 4.1 3.5 - 5.0 g/dL   AST 18 15 - 41 U/L   ALT 16 14 - 54 U/L   Alkaline Phosphatase 59 38 - 126 U/L   Total Bilirubin 0.3 0.3 - 1.2 mg/dL   GFR calc non Af Amer >60 >60 mL/min   GFR calc Af Amer >60 >60 mL/min   Anion gap 8 5 - 15   CBC  Result Value Ref Range   WBC 8.7 4.0 - 10.5 K/uL   RBC 4.84 3.87 - 5.11 MIL/uL   Hemoglobin 13.4 12.0 - 15.0 g/dL   HCT 40.3 36.0 - 46.0 %   MCV 83.3 78.0 - 100.0 fL   MCH 27.7 26.0 - 34.0 pg   MCHC 33.3 30.0 - 36.0 g/dL   RDW 14.9 11.5 - 15.5 %   Platelets 197 150 - 400 K/uL  Urinalysis, Routine w reflex microscopic (not at [facility])  Result Value Ref Range   Color, Urine YELLOW YELLOW   APPearance CLEAR CLEAR   Specific Gravity, Urine 1.017 1.005 - 1.030  pH 5.5 5.0 - 8.0   Glucose, UA NEGATIVE NEGATIVE mg/dL   Hgb urine dipstick TRACE (A) NEGATIVE   Bilirubin Urine NEGATIVE NEGATIVE   Ketones, ur NEGATIVE NEGATIVE mg/dL   Protein, ur NEGATIVE NEGATIVE mg/dL   Nitrite NEGATIVE NEGATIVE   Leukocytes, UA SMALL (A) NEGATIVE  Urine microscopic-add on  Result Value Ref Range   Squamous Epithelial / LPF 6-30 (A) NONE SEEN   WBC, UA 6-30 0 - 5 WBC/hpf   RBC / HPF 0-5 0 - 5 RBC/hpf   Bacteria, UA RARE (A) NONE SEEN   Imaging Review Dg Chest 2 View  08/20/2015  CLINICAL DATA:  Cough x3 days, hemoptysis EXAM: CHEST  2 VIEW COMPARISON:  02/10/2013 FINDINGS: Left perihilar mass-like opacity. Possible mild retrocardiac/left lower lobe opacity. No pleural effusion or pneumothorax. The heart is normal in size. Visualized osseous structures are within normal limits. IMPRESSION: Left perihilar mass-like opacity. Possible mild retrocardiac/left lower lobe opacity, possibly postobstructive. CT chest with contrast is suggested to exclude pulmonary mass with adenopathy. Electronically Signed   By: Julian Hy M.D.   On: 08/20/2015 00:01   I have personally reviewed and evaluated these images and lab results as part of my medical decision-making.    MDM   Final diagnoses:  Lung mass  Chest wall pain  Supratherapeutic INR  Hemoptysis  Epistaxis  Elevated lipase    ED and coughing up blood. It is not clear from history whether the source of the blood from coughing was actually  from the nosebleed or actual true hemoptysis. There was no subsequent coughing up blood cell index of suspicion is low for significant hemoptysis. Her pain is clearly chest wall pain which may be related to injury to the costal cartilage or intercostal musculature from coughing. Laboratory evaluation shows elevated lipase which is not felt to be clinically significant. She does not clinically have pancreatitis. Chest x-ray was obtained showing a left hilar mass. This was not present on a chest x-ray from 2014. I've explained this to the patient and offered the option of getting a CT scan here or having it done as an outpatient. She has decided to contact her PCP and have the CT scan done as an outpatient. She was also offered a prescription for hydrocodone-acetaminophen 4, a sharp pain and cough suppression but she has declined this prescription as well. 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 She does not have systemic symptoms such as weight loss or fever. Importance of prompt follow-up and CT scan was stressed.   I personally performed the services described in this documentation, which was scribed in my presence. The recorded information has been reviewed and is accurate.       Delora Fuel, MD [id number]

## 2015-08-20 ENCOUNTER — Telehealth: Payer: Self-pay | Admitting: Family Medicine

## 2015-08-20 DIAGNOSIS — R9389 Abnormal findings on diagnostic imaging of other specified body structures: Secondary | ICD-10-CM

## 2015-08-20 NOTE — Telephone Encounter (Signed)
Pt was in the ER on 08/19/15. Please review xray results and advise if pt needs imaging and OV or just imaging.

## 2015-08-20 NOTE — Discharge Instructions (Signed)
Your x-rays showed a mass on your left lung. This is concerning for cancer. Please call your primary care provider in the morning to set up an outpatient CT scan. Regarding her cough, you may take any over-the-counter medication with dextromethorphan to help suppress the cough. She may take acetaminophen for pain. Do not take ibuprofen or naproxen a cause of the bleeding risk when combined with warfarin.  Pulmonary Nodule A pulmonary nodule is a small, round growth of tissue in the lung. Pulmonary nodules can range in size from less than 1/5 inch (4 mm) to a little bigger than an inch (25 mm). Most pulmonary nodules are detected when imaging tests of the lung are being performed for a different problem. Pulmonary nodules are usually not cancerous (benign). However, some pulmonary nodules are cancerous (malignant). Follow-up treatment or testing is based on the size of the pulmonary nodule and your risk of getting lung cancer.  CAUSES Benign pulmonary nodules can be caused by various things. Some of the causes include:   Bacterial, fungal, or viral infections. This is usually an old infection that is no longer active, but it can sometimes be a current, active infection.  A benign mass of tissue.  Inflammation from conditions such as rheumatoid arthritis.   Abnormal blood vessels in the lungs. Malignant pulmonary nodules can result from lung cancer or from cancers that spread to the lung from other places in the body. SIGNS AND SYMPTOMS Pulmonary nodules usually do not cause symptoms. DIAGNOSIS Most often, pulmonary nodules are found incidentally when an X-ray or CT scan is performed to look for some other problem in the lung area. To help determine whether a pulmonary nodule is benign or malignant, your health care provider will take a medical history and order a variety of tests. Tests done may include:   Blood tests.  A skin test called a tuberculin test. This test is used to determine if you  have been exposed to the germ that causes tuberculosis.   Chest X-rays. If possible, a new X-ray may be compared with X-rays you have had in the past.   CT scan. This test shows smaller pulmonary nodules more clearly than an X-ray.   Positron emission tomography (PET) scan. In this test, a safe amount of a radioactive substance is injected into the bloodstream. Then, the scan takes a picture of the pulmonary nodule. The radioactive substance is eliminated from your body in your urine.   Biopsy. A tiny piece of the pulmonary nodule is removed so it can be checked under a microscope. TREATMENT  Pulmonary nodules that are benign normally do not require any treatment because they usually do not cause symptoms or breathing problems. Your health care provider may want to monitor the pulmonary nodule through follow-up CT scans. The frequency of these CT scans will vary based on the size of the nodule and the risk factors for lung cancer. For example, CT scans will need to be done more frequently if the pulmonary nodule is larger and if you have a history of smoking and a family history of cancer. Further testing or biopsies may be done if any follow-up CT scan shows that the size of the pulmonary nodule has increased. HOME CARE INSTRUCTIONS  Only take over-the-counter or prescription medicines as directed by your health care provider.  Keep all follow-up appointments with your health care provider. SEEK MEDICAL CARE IF:  You have trouble breathing when you are active.   You feel sick or unusually tired.  You do not feel like eating.   You lose weight without trying to.   You develop chills or night sweats.  SEEK IMMEDIATE MEDICAL CARE IF:  You cannot catch your breath, or you begin wheezing.   You cannot stop coughing.   You cough up blood.   You become dizzy or feel like you are going to pass out.   You have sudden chest pain.   You have a fever or persistent symptoms  for more than 2-3 days.   You have a fever and your symptoms suddenly get worse. MAKE SURE YOU:  Understand these instructions.  Will watch your condition.  Will get help right away if you are not doing well or get worse.   This information is not intended to replace advice given to you by your health care provider. Make sure you discuss any questions you have with your health care provider.   Document Released: 02/15/2009 Document Revised: 12/21/2012 Document Reviewed: 10/10/2012 Elsevier Interactive Patient Education 2016 Elsevier Inc.   Chest Wall Pain Chest wall pain is pain in or around the bones and muscles of your chest. Sometimes, an injury causes this pain. Sometimes, the cause may not be known. This pain may take several weeks or longer to get better. HOME CARE INSTRUCTIONS  Pay attention to any changes in your symptoms. Take these actions to help with your pain:   Rest as told by your health care provider.   Avoid activities that cause pain. These include any activities that use your chest muscles or your abdominal and side muscles to lift heavy items.   If directed, apply ice to the painful area:  Put ice in a plastic bag.  Place a towel between your skin and the bag.  Leave the ice on for 20 minutes, 2-3 times per day.  Take over-the-counter and prescription medicines only as told by your health care provider.  Do not use tobacco products, including cigarettes, chewing tobacco, and e-cigarettes. If you need help quitting, ask your health care provider.  Keep all follow-up visits as told by your health care provider. This is important. SEEK MEDICAL CARE IF:  You have a fever.  Your chest pain becomes worse.  You have new symptoms. SEEK IMMEDIATE MEDICAL CARE IF:  You have nausea or vomiting.  You feel sweaty or light-headed.  You have a cough with phlegm (sputum) or you cough up blood.  You develop shortness of breath.   This information is not  intended to replace advice given to you by your health care provider. Make sure you discuss any questions you have with your health care provider.   Document Released: 04/20/2005 Document Revised: 01/09/2015 Document Reviewed: 07/16/2014 Elsevier Interactive Patient Education 2016 Elsevier Inc.  Cough, Adult Coughing is a reflex that clears your throat and your airways. Coughing helps to heal and protect your lungs. It is normal to cough occasionally, but a cough that happens with other symptoms or lasts a long time may be a sign of a condition that needs treatment. A cough may last only 2-3 weeks (acute), or it may last longer than 8 weeks (chronic). CAUSES Coughing is commonly caused by:  Breathing in substances that irritate your lungs.  A viral or bacterial respiratory infection.  Allergies.  Asthma.  Postnasal drip.  Smoking.  Acid backing up from the stomach into the esophagus (gastroesophageal reflux).  Certain medicines.  Chronic lung problems, including COPD (or rarely, lung cancer).  Other medical conditions such as heart  failure. HOME CARE INSTRUCTIONS  Pay attention to any changes in your symptoms. Take these actions to help with your discomfort:  Take medicines only as told by your health care provider.  If you were prescribed an antibiotic medicine, take it as told by your health care provider. Do not stop taking the antibiotic even if you start to feel better.  Talk with your health care provider before you take a cough suppressant medicine.  Drink enough fluid to keep your urine clear or pale yellow.  If the air is dry, use a cold steam vaporizer or humidifier in your bedroom or your home to help loosen secretions.  Avoid anything that causes you to cough at work or at home.  If your cough is worse at night, try sleeping in a semi-upright position.  Avoid cigarette smoke. If you smoke, quit smoking. If you need help quitting, ask your health care  provider.  Avoid caffeine.  Avoid alcohol.  Rest as needed. SEEK MEDICAL CARE IF:   You have new symptoms.  You cough up pus.  Your cough does not get better after 2-3 weeks, or your cough gets worse.  You cannot control your cough with suppressant medicines and you are losing sleep.  You develop pain that is getting worse or pain that is not controlled with pain medicines.  You have a fever.  You have unexplained weight loss.  You have night sweats. SEEK IMMEDIATE MEDICAL CARE IF:  You cough up blood.  You have difficulty breathing.  Your heartbeat is very fast.   This information is not intended to replace advice given to you by your health care provider. Make sure you discuss any questions you have with your health care provider.   Document Released: 10/17/2010 Document Revised: 01/09/2015 Document Reviewed: 06/27/2014 Elsevier Interactive Patient Education [employer].

## 2015-08-20 NOTE — Telephone Encounter (Signed)
Pt would like to know if she really needs to come in to see Dr. Elease Hashimoto.  Pt was in the ER on 7/17 and would like for him to check the xray from last night due to the change in her lungs.  Pt will need to have a CT w/contrast

## 2015-08-21 NOTE — Telephone Encounter (Signed)
Pt is aware that order has been entered.

## 2015-08-21 NOTE — Telephone Encounter (Signed)
[facility] Night - Client TELEPHONE [facility] Patient Name: Jenny [name] Gender: Female DOB: 06-Dec-1950 Age: 65 Y 30 M 16 D Return Phone Number: [phone] (Primary), [phone] (Secondary) Address: City/State/Zip: [place] Client [facility] Night - Client Client Site [facility] - Night Physician Carolann Littler Contact Type Call Who Is Calling Patient / Member / Family / Caregiver Call Type Triage / Clinical Caller Name Suri Relationship To Patient Self Return Phone Number [phone] (Primary) Chief Complaint VOMITING - Blood Reason for Call Symptomatic / Request for Health Information Initial Comment Caller Aniesha Haughn was in to see MD earlier today for abd pain with coughing. He thought she may have pulled something. CURRENT: She is now coughing up blood. Also blood with blowing nose. PreDisposition InappropriateToAsk Translation No Nurse Assessment Nurse: Harlow Mares, RN, Suanne Marker Date/Time (Eastern Time): 08/19/2015 6:24:48 PM Confirm and document reason for call. If symptomatic, describe symptoms. You must click the next button to save text entered. ---Caller Demetrios Isaacs was in to see MD earlier today for abd pain with coughing. He thought she may have pulled something. CURRENT: She is now coughing up blood. Also blood with blowing nose. Reports symptoms began as a head cold last Friday, coughed and had a pain in her left breast. No pain with movement, just pain with coughing. INR was performed. Blood was a little thin 3.4. Has the patient traveled out of the country within the last 30 days? ---No Does the patient have any new or worsening symptoms? ---Yes Will a triage be completed? ---Yes Related visit to physician within the last 2 weeks? ---Yes Does the PT have any chronic conditions? (i.e. diabetes, asthma, etc.) ---Yes List chronic conditions. ---blood clots. (takes  blood thinner) Is this a behavioral health or substance abuse call? ---No Guidelines Guideline Title Affirmed Question Affirmed Notes Nurse Date/Time (Eastern Time) Coughing Up Blood History of prior "blood clot" in leg or lungs (i.e., Harlow Mares, RN, Rhonda 08/19/2015 6:26:43 PM PLEASE NOTE: All timestamps contained within this report are represented as [place] Standard Time. CONFIDENTIALTY NOTICE: This fax transmission is intended only for the addressee. It contains information that is legally privileged, confidential or otherwise protected from use or disclosure. If you are not the intended recipient, you are strictly prohibited from reviewing, disclosing, copying using or disseminating any of this information or taking any action in reliance on or regarding this information. If you have received this fax in error, please notify us immediately by telephone so that we can arrange for its return to [place]. Phone: [phone], Toll-Free: [phone], Fax: [phone] Page: 2 of 2 Call Id: [id number] Guidelines Guideline Title Affirmed Question Affirmed Notes Nurse Date/Time Eilene Ghazi Time) deep vein thrombosis, pulmonary embolism) Disp. Time Eilene Ghazi Time) Disposition Final User 08/19/2015 6:23:47 PM Send to Urgent Queue Noelle Penner 08/19/2015 6:29:03 PM Go to ED Now Yes Harlow Mares, RN, Rosalyn Charters Understands: Yes Disagree/Comply: Comply Care Advice Given Per Guideline GO TO ED NOW: You need to be seen in the Emergency Department. Go to the ER at ___________ [place] now. Drive carefully. SAMPLE: Bring in a sample of the blood for testing (R/O false positive). BRING MEDICINES: * Please bring a list of your current medicines when you go to see the doctor. * It is also a good idea to bring the pill bottles too. This will help the doctor to make certain you are taking the right medicines and the right dose. CARE ADVICE given per Coughing Up  Blood guideline. Referrals [facility] -  ED

## 2015-08-21 NOTE — Telephone Encounter (Signed)
No office visit.  I have put in order for CT chest with contrast.  Let pt know.  Thx!

## 2015-08-23 ENCOUNTER — Ambulatory Visit (INDEPENDENT_AMBULATORY_CARE_PROVIDER_SITE_OTHER): Payer: BLUE CROSS/BLUE SHIELD | Admitting: Psychology

## 2015-08-23 DIAGNOSIS — F4323 Adjustment disorder with mixed anxiety and depressed mood: Secondary | ICD-10-CM

## 2015-08-26 ENCOUNTER — Ambulatory Visit (INDEPENDENT_AMBULATORY_CARE_PROVIDER_SITE_OTHER)
Admission: RE | Admit: 2015-08-26 | Discharge: 2015-08-26 | Disposition: A | Discharge status: Home / Self Care | Payer: BLUE CROSS/BLUE SHIELD | Source: Ambulatory Visit | Attending: Family Medicine | Admitting: Family Medicine

## 2015-08-26 ENCOUNTER — Ambulatory Visit (INDEPENDENT_AMBULATORY_CARE_PROVIDER_SITE_OTHER): Payer: BLUE CROSS/BLUE SHIELD | Admitting: Family Medicine

## 2015-08-26 ENCOUNTER — Telehealth: Payer: Self-pay | Admitting: *Deleted

## 2015-08-26 ENCOUNTER — Ambulatory Visit: Payer: Self-pay | Admitting: Family Medicine

## 2015-08-26 VITALS — BP 150/88 | HR 100 | Temp 97.3°F | Ht 64.0 in | Wt 136.3 lb

## 2015-08-26 DIAGNOSIS — R9389 Abnormal findings on diagnostic imaging of other specified body structures: Secondary | ICD-10-CM

## 2015-08-26 DIAGNOSIS — R918 Other nonspecific abnormal finding of lung field: Secondary | ICD-10-CM

## 2015-08-26 DIAGNOSIS — R938 Abnormal findings on diagnostic imaging of other specified body structures: Secondary | ICD-10-CM

## 2015-08-26 MED ORDER — IOPAMIDOL (ISOVUE-300) INJECTION 61%
80.0000 mL | Freq: Once | INTRAVENOUS | Status: AC | PRN
  Administered 2015-08-26: 80 mL via INTRAVENOUS

## 2015-08-26 NOTE — Progress Notes (Signed)
   Subjective:    Patient ID: Jenny [name], female    DOB: [birth year], 65 y.o.   MRN: [id number]  HPI  Patient is here to discuss recent abnormal CT lung. She is a long-term smoker  Back a couple weeks ago she presented with acute sinusitis symptoms and 2 week history of cough. At that point, she denied any hemoptysis. No appetite or weight changes. Subsequently presented to emergency department April 17 with one episode of bright red hemoptysis that evening after eating dinner. She's had no hemoptysis whatsoever since then. Patient on coumadin for hx of recurrent DVT.   Chest x-ray that visit showed left perihilar mass like opacity with possible left lower lobe opacity- possibly postobstructive.   We then set up CT chest with contrast to further evaluate.  This was earlier today confirming left hilar mass with ipsilateral mediastinal and subcarinal adenopathy and possible left lower lobe mass. 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  Patient continues to have occasional cough at night but not severe. No hemoptysis. No dyspnea.  No fever or chills.      Past Medical History  Diagnosis Date  . DVT (deep venous thrombosis) ([place])     1 year ago  . Hypercholesteremia    Past Surgical History  Procedure Laterality Date  . Cesarean section      reports that she has been smoking Cigarettes.  She has a 39 pack-year smoking history. She does not have any smokeless tobacco history on file. She reports that she does not drink alcohol or use illicit drugs. family history includes Cancer in her father; Hypertension in her mother; Stroke in her mother. Allergies  Allergen Reactions  . Morphine And Related Rash      Review of Systems  Constitutional: Negative for fever, chills and appetite change.  Respiratory: Positive for cough. Negative for shortness of breath and wheezing.   Cardiovascular: Negative for chest pain, palpitations and leg swelling.  Neurological:  Negative for headaches.  Hematological: Negative for adenopathy.       Objective:   Physical Exam  Constitutional: She appears well-developed and well-nourished.  Neck: Neck supple. No thyromegaly present.  Cardiovascular: Normal rate and regular rhythm.   Pulmonary/Chest: Effort normal and breath sounds normal. No respiratory distress. She has no wheezes. She has no rales.  Musculoskeletal: She exhibits no edema.  Lymphadenopathy:    She has no cervical adenopathy.          Assessment & Plan:   Left lung mass- perihilar and possibly left lower lobe. 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  Schedule PET scan and proceed with referral for tissue diagnosis. Discussed with radiology and they think this would be reached most likely by bronchoscopy. Will discuss with pulmonary. Discussed results today with patient and her husband. Answered any questions I could. Patient does have history of recurrent DVT and is on chronic Coumadin and would need to be bridged with Lovenox prior to any biopsy.

## 2015-08-26 NOTE — Progress Notes (Signed)
Pre visit review using our clinic review tool, if applicable. No additional management support is needed unless otherwise documented below in the visit note. 

## 2015-08-26 NOTE — Telephone Encounter (Signed)
Pines called from [facility] with a call report of the CT chest: left hiler mass with ipsilateral mediastimal subcarinal adenopathy-possible left lower lobe intrapulmonary lymph node and left lower lobe mass.  She stated she will fax the report also.

## 2015-08-26 NOTE — Telephone Encounter (Signed)
Pt has appt to come in to review this afternoon,.

## 2015-08-28 ENCOUNTER — Ambulatory Visit (INDEPENDENT_AMBULATORY_CARE_PROVIDER_SITE_OTHER): Payer: BLUE CROSS/BLUE SHIELD | Admitting: Pulmonary Disease

## 2015-08-28 ENCOUNTER — Encounter: Payer: Self-pay | Admitting: Pulmonary Disease

## 2015-08-28 VITALS — BP 118/62 | HR 98 | Ht 64.0 in | Wt 133.0 lb

## 2015-08-28 DIAGNOSIS — I82409 Acute embolism and thrombosis of unspecified deep veins of unspecified lower extremity: Secondary | ICD-10-CM

## 2015-08-28 DIAGNOSIS — R042 Hemoptysis: Secondary | ICD-10-CM | POA: Insufficient documentation

## 2015-08-28 DIAGNOSIS — R05 Cough: Secondary | ICD-10-CM

## 2015-08-28 DIAGNOSIS — R059 Cough, unspecified: Secondary | ICD-10-CM

## 2015-08-28 DIAGNOSIS — J189 Pneumonia, unspecified organism: Secondary | ICD-10-CM | POA: Diagnosis not present

## 2015-08-28 DIAGNOSIS — J181 Lobar pneumonia, unspecified organism: Secondary | ICD-10-CM

## 2015-08-28 DIAGNOSIS — J439 Emphysema, unspecified: Secondary | ICD-10-CM

## 2015-08-28 DIAGNOSIS — J449 Chronic obstructive pulmonary disease, unspecified: Secondary | ICD-10-CM | POA: Insufficient documentation

## 2015-08-28 DIAGNOSIS — R918 Other nonspecific abnormal finding of lung field: Secondary | ICD-10-CM | POA: Diagnosis not present

## 2015-08-28 MED ORDER — LEVOFLOXACIN 750 MG PO TABS: 750.0000 mg | ORAL_TABLET | Freq: Every day | ORAL | Status: AC

## 2015-08-28 MED ORDER — BUDESONIDE-FORMOTEROL FUMARATE 160-4.5 MCG/ACT IN AERO: 2.0000 | INHALATION_SPRAY | Freq: Two times a day (BID) | RESPIRATORY_TRACT | Status: DC

## 2015-08-28 NOTE — Assessment & Plan Note (Signed)
Likely mild-mod copd. Good fxnal capacity. Start symbicort 160/4.5 2P BID prior to bronchoscopy. Will need pfts, abg.

## 2015-08-28 NOTE — Assessment & Plan Note (Signed)
42PY smoking history, good fxnal capacity, pt with recent non resolving cough. Not better with abx. CXR with L perihilar mass. Chest ct scan (08/26/15) L hilar mass 4.7 x 6.6 cm, compressing on the LUL and LLL bronchi. Mediastinal and subcarinal LAD (?2R). Also with a LLL mass/nodule 2.3 x 3 cm.  Differentials : Likely lung CA, less likely inflammatory or infectious. Possible post obstructive PNA.  Extensively d/w pt and husband re: bronchoscopy with EBUS for staging. D/w them risks such as PTX, hypoxemia, bleeding.  Pt has bleeding now likely 2/2 erosion of mass into airways/vessels.  Plan : 1. Plan for Bronch with EBUS with Bx at [facility] with Dr. Lamonte Sakai on May 8th 8am Monday. 2. This will be done under GA. 3. Pt will need pre op evaluation by anesthesia. 4. Recent blood work on 08/19/15. Hold off unless needed by anesthesia.  5. Pt is on coumadin. Needs to be overlapped with Lovenox. PCP office will take care of bridging.  Actually, if pt has CA, Lovenox is a better drug for [place]. 6. Pt is to have a PET scan on may 2. 7. rx for possible post obstructuve PNA 8. Start symbicort to optimize pt. Has some wheezing today. Likely compression of airway 2/2 mass.

## 2015-08-28 NOTE — Progress Notes (Signed)
Subjective:    Patient ID: Jenny [name], female    DOB: April 13, 1951, 65 y.o.   MRN: [id number]  HPI   This is the case of Jenny [name], 65 y.o. Female, who was referred by Dr. Romana Juniper  in consultation regarding lung mass.   As you very well know, patient  Quit smoking 1 week ago when her ct scan results showed a mass. 42 PY smoking history.  Not been dxed with asthma or copd. Not too symptomatic. Can do ADLs w/o beeing too SOB.   Was dxed with DVT R calf in 2011. Unprovoked. Has been on coumadin since that time. Takes 5 mg MWF, other days is 8 mg. INR 4/17 was 3.4  Pt with on and off cough over several months. Given Abx and cough meds. Cough was not getting better. Started to have abdominal pain.  Ended up having hemoptysis. CXR and chest ct scan showed LUL/LLL mass.      Review of Systems  Constitutional: Negative.   HENT: Positive for congestion and postnasal drip.   Eyes: Negative.   Respiratory: Positive for cough, shortness of breath and wheezing.   Cardiovascular: Negative.   Gastrointestinal: Negative.   Endocrine: Negative.   Genitourinary: Negative.   Musculoskeletal: Negative.   Skin: Negative.   Allergic/Immunologic: Positive for environmental allergies.  Neurological: Negative.   Hematological: Bruises/bleeds easily.  Psychiatric/Behavioral: Negative.    Past Medical History  Diagnosis Date  . DVT (deep venous thrombosis) ([place])     1 year ago  . Hypercholesteremia     (-) CA. CVA. HTN  Family History  Problem Relation Age of Onset  . Stroke Mother   . Hypertension Mother   . Cancer Father     colon   Mother  Had throat CA and Lymphoma.   Past Surgical History  Procedure Laterality Date  . Cesarean section      Social History   Social History  . Marital Status: Married    Spouse Name: N/A  . Number of Children: N/A  . Years of Education: N/A   Occupational History  . Not on file.   Social History Main Topics  . Smoking status:  Former Smoker -- 1.00 packs/day for 39 years    Types: Cigarettes    Quit date: 08/20/2015  . Smokeless tobacco: Not on file  . Alcohol Use: No  . Drug Use: No  . Sexual Activity: Yes    Birth Control/ Protection: None   Other Topics Concern  . Not on file   Social History Narrative   Works at a daycare. (-) ETOH.   Allergies  Allergen Reactions  . Morphine And Related Rash     Outpatient Prescriptions Prior to Visit  Medication Sig Dispense Refill  . albuterol (PROAIR HFA) 108 (90 Base) MCG/ACT inhaler Inhale 2 puffs into the lungs every 4 (four) hours as needed for wheezing or shortness of breath. 1 Inhaler 2  . atorvastatin (LIPITOR) 10 MG tablet TAKE 1 TABLET BY MOUTH EVERY DAY (Patient taking differently: TAKE 10 MG BY MOUTH EVERY DAY) 90 tablet 1  . Vitamin D, Ergocalciferol, (DRISDOL) 50000 UNITS CAPS capsule TAKE ONE CAPSULE BY MOUTH ONCE A WEEK ON THURSDAYS 12 capsule 2  . warfarin (COUMADIN) 3 MG tablet TAKE AS DIRECTED BY ANTICOAGULATION CLINIC (Patient taking differently: Take '3mg'$  in addition to 5 mg by mouth tuesday, thursday, saturday, sunday) 30 tablet 2  . warfarin (COUMADIN) 5 MG tablet TAKE AS DIRECTED BY ANTICOAGULATION  CLINIC (Patient taking differently: Take 5 mg by mouth monday, wednesday, and friday) 45 tablet 2   No facility-administered medications prior to visit.   No orders of the defined types were placed in this encounter.          Objective:   Physical Exam   Vitals:  Filed Vitals:   08/28/15 1344  BP: 118/62  Pulse: 98  Height: '5\' 4"'$  (1.626 m)  Weight: 133 lb (60.328 kg)  SpO2: 95%    Constitutional/General:  Pleasant, well-nourished, well-developed, not in any distress,  Comfortably seating.  Well kempt  Body mass index is 22.82 kg/(m^2). Wt Readings from Last 3 Encounters:  08/28/15 133 lb (60.328 kg)  08/26/15 136 lb 4.8 oz (61.825 kg)  08/19/15 134 lb (60.782 kg)    Neck circumference:   HEENT: Pupils equal and reactive  to light and accommodation. Anicteric sclerae. Normal nasal mucosa.   No oral  lesions,  mouth clear,  oropharynx clear, no postnasal drip. (-) Oral thrush. No dental caries.  Airway - Mallampati class III  Neck: No masses. Midline trachea. No JVD, (-) LAD. (-) bruits appreciated.  Respiratory/Chest: Grossly normal chest. (-) deformity. (-) Accessory muscle use.  Symmetric expansion. (-) Tenderness on palpation.  Resonant on percussion.  Diminished BS on both lower lung zones. (-) wheezing, rhonchi (-) egophony Crackles in L mid-lower lung zones with diminished BS  Cardiovascular: Regular rate and  rhythm, heart sounds normal, no murmur or gallops, no peripheral edema  Gastrointestinal:  Normal bowel sounds. Soft, non-tender. No hepatosplenomegaly.  (-) masses.   Musculoskeletal:  Normal muscle tone. Normal gait.   Extremities: Grossly normal. (-) clubbing, cyanosis.  (-) edema  Skin: (-) rash,lesions seen.   Neurological/Psychiatric : alert, oriented to time, place, person. Normal mood and affect           Assessment & Plan:  Lung mass 42PY smoking history, good fxnal capacity, pt with recent non resolving cough. Not better with abx. CXR with L perihilar mass. Chest ct scan (08/26/15) L hilar mass 4.7 x 6.6 cm, compressing on the LUL and LLL bronchi. Mediastinal and subcarinal LAD (?2R). Also with a LLL mass/nodule 2.3 x 3 cm.  Differentials : Likely lung CA, less likely inflammatory or infectious. Possible post obstructive PNA.  Extensively d/w pt and husband re: bronchoscopy with EBUS for staging. D/w them risks such as PTX, hypoxemia, bleeding.  Pt has bleeding now likely 2/2 erosion of mass into airways/vessels.  Plan : 1. Plan for Bronch with EBUS with Bx at [facility] with Dr. Lamonte Sakai on May 8th 8am Monday. 2. This will be done under GA. 3. Pt will need pre op evaluation by anesthesia. 4. Recent blood work on 08/19/15. Hold off unless needed by anesthesia.  5. Pt is on  coumadin. Needs to be overlapped with Lovenox. PCP office will take care of bridging.  Actually, if pt has CA, Lovenox is a better drug for [place]. 6. Pt is to have a PET scan on may 2. 7. rx for possible post obstructuve PNA 8. Start symbicort to optimize pt. Has some wheezing today. Likely compression of airway 2/2 mass.    CAP (community acquired pneumonia) Pt likely with post obstructive pna.Hemoptysis, mild SOB, throat soreness. Plan for levaquin x 1 week. D/w pt re: side effects of levaquin such as tendinitis.   Hemoptysis Likely 2/2 mass invasion into airways, possible infxn. On coumadin/lovenox as well. To ER if with inc in  hemoptysis  DVT, lower extremity,  recurrent Plan to overlap coumadin with lovenox for bronch c/o PCP  COPD (chronic obstructive pulmonary disease) ([facility]) Likely mild-mod copd. Good fxnal capacity. Start symbicort 160/4.5 2P BID prior to bronchoscopy. Will need pfts, abg.     I personally reviewed previous images (Chest Xray, Chest Ct scan) done on this patient. I reviewed the reports on the images as well.    Thank you very much for letting me participate in this patient's care. Please do not hesitate to give me a call if you have any questions or concerns regarding the treatment plan.   Patient will follow up with me on May 16th    J. Shirl Harris, MD 08/28/2015   10:46 PM Pulmonary and [place] Pager: [phone] Office: [phone], Fax: [phone]

## 2015-08-28 NOTE — Assessment & Plan Note (Signed)
Pt likely with post obstructive pna.Hemoptysis, mild SOB, throat soreness. Plan for levaquin x 1 week. D/w pt re: side effects of levaquin such as tendinitis.

## 2015-08-28 NOTE — Assessment & Plan Note (Signed)
Plan to overlap coumadin with lovenox for bronch c/o PCP

## 2015-08-28 NOTE — Assessment & Plan Note (Addendum)
Likely 2/2 mass invasion into airways, possible infxn. On coumadin/lovenox as well. To ER if with inc in  hemoptysis

## 2015-08-28 NOTE — Patient Instructions (Signed)
1.  We will schedule you for a bronchoscopy for may 8th 8am.  2. We will call you to schedule blood work and anesthesia pre operatively. 3. Start symbicort 160/4.5 2puffs 2x/d. 4. levaquin 750 mg/d for 1 week 5. Overlap coumadin with lovenox. 6. To ER if with increased bleeding.   Return to clinic on May 16th 2pm

## 2015-08-29 ENCOUNTER — Telehealth: Payer: Self-pay | Admitting: Pulmonary Disease

## 2015-08-29 ENCOUNTER — Telehealth: Payer: Self-pay | Admitting: General Practice

## 2015-08-29 ENCOUNTER — Other Ambulatory Visit: Payer: Self-pay | Admitting: General Practice

## 2015-08-29 MED ORDER — ENOXAPARIN SODIUM 100 MG/ML ~~LOC~~ SOLN: 1.5000 mg/kg | SUBCUTANEOUS | Status: DC

## 2015-08-29 NOTE — Telephone Encounter (Signed)
Instructions for coumadin and Lovenox pre and post procedure on 5/8. 5/3- Last dose of coumadin 5/4 - Nothing 5/5 - Lovenox in the AM 5/6 - Lovenox in the AM 5/7 - Lovenox in the AM (There needs to be 24 hours between this Lovenox injection and procedure) 5/8 - Procedure (DO NOT TAKE LOVENOX TODAY) 5/9 - Lovenox in the AM and 10 mg coumadin 5/10 - Lovenox in the AM and 10 mg coumadin 5/11 - Lovenox in the AM and 7.5 mg coumadin 5/12 - Lovenox in the AM and 7.5 mg coumadin  Check INR at [facility] office of Friday 5/12

## 2015-08-29 NOTE — Telephone Encounter (Signed)
Pt is scheduled for EBUS 09/09/15 @ 8am. Please bridge with Lovenox '100mg'$  according to your office protocol.  Thank you!

## 2015-08-29 NOTE — Telephone Encounter (Signed)
Instructions for Lovenox bridge given to patient.  Patient verbalized understanding.

## 2015-08-30 ENCOUNTER — Telehealth: Payer: Self-pay | Admitting: General Practice

## 2015-08-30 ENCOUNTER — Telehealth: Payer: Self-pay | Admitting: Family Medicine

## 2015-08-30 NOTE — Telephone Encounter (Signed)
Pt would like to speak with Dr. Elease Hashimoto personal matter (this week Dx of lung cancer).

## 2015-08-30 NOTE — Telephone Encounter (Signed)
-----   Message from Eulas Post, MD sent at 08/29/2015  6:14 PM EDT ----- Regarding: RE: Lovenox bridge That would be awesome!  Let me know if you have any questions. ----- Message -----    From: Warden Fillers, RN    Sent: 08/29/2015   9:29 AM      To: Eulas Post, MD Subject: Lovenox bridge                                 Dr. Elease Hashimoto,  Received a note from Dr. Tennis Must Dios's office about bridging patient with Lovenox for a procedure (EBUS on 5/8).  I will be happy to dose the Lovenox and send in the script to the pharmacy.  I will also call patient with instructions.  Please let me know if you need anything else from me.  Thanks, Villa Herb, RN

## 2015-08-30 NOTE — Telephone Encounter (Signed)
Called pt to get update on her recent visit with pulmonary.  PET scan pending and bronchoscopy for May 8th.

## 2015-09-03 ENCOUNTER — Encounter (HOSPITAL_COMMUNITY)
Admission: RE | Admit: 2015-09-03 | Discharge: 2015-09-03 | Disposition: A | Discharge status: Home / Self Care | Payer: BLUE CROSS/BLUE SHIELD | Source: Ambulatory Visit | Attending: Family Medicine | Admitting: Family Medicine

## 2015-09-03 DIAGNOSIS — R918 Other nonspecific abnormal finding of lung field: Secondary | ICD-10-CM | POA: Insufficient documentation

## 2015-09-03 LAB — GLUCOSE, CAPILLARY: Glucose-Capillary: 104 mg/dL — ABNORMAL HIGH (ref 65–99)

## 2015-09-03 MED ORDER — FLUDEOXYGLUCOSE F - 18 (FDG) INJECTION
6.5300 | Freq: Once | INTRAVENOUS | Status: AC | PRN
  Administered 2015-09-03: 6.53 via INTRAVENOUS

## 2015-09-04 ENCOUNTER — Telehealth: Payer: Self-pay | Admitting: Family Medicine

## 2015-09-04 NOTE — Telephone Encounter (Signed)
Pt is not able to speak with you at this present time due to family emergency.  Pt would like for you to speak with her sister [name].

## 2015-09-05 NOTE — Telephone Encounter (Signed)
I have already spoken with patient regarding PET scan results

## 2015-09-06 ENCOUNTER — Ambulatory Visit: Payer: BLUE CROSS/BLUE SHIELD | Admitting: Psychology

## 2015-09-09 ENCOUNTER — Encounter (HOSPITAL_COMMUNITY): Admission: RE | Payer: Self-pay | Source: Ambulatory Visit

## 2015-09-09 ENCOUNTER — Encounter (HOSPITAL_COMMUNITY): Payer: Self-pay

## 2015-09-09 ENCOUNTER — Ambulatory Visit (HOSPITAL_COMMUNITY)
Admission: RE | Admit: 2015-09-09 | Payer: BLUE CROSS/BLUE SHIELD | Source: Ambulatory Visit | Admitting: Emergency Medicine

## 2015-09-09 SURGERY — ENDOBRONCHIAL ULTRASOUND (EBUS)
Anesthesia: General | Laterality: Bilateral

## 2015-09-17 ENCOUNTER — Ambulatory Visit: Payer: Self-pay | Admitting: Pulmonary Disease

## 2015-09-18 ENCOUNTER — Ambulatory Visit (INDEPENDENT_AMBULATORY_CARE_PROVIDER_SITE_OTHER): Payer: BLUE CROSS/BLUE SHIELD | Admitting: General Practice

## 2015-09-18 ENCOUNTER — Ambulatory Visit: Payer: Self-pay

## 2015-09-18 ENCOUNTER — Ambulatory Visit (INDEPENDENT_AMBULATORY_CARE_PROVIDER_SITE_OTHER): Payer: BLUE CROSS/BLUE SHIELD | Admitting: Family Medicine

## 2015-09-18 VITALS — BP 130/90 | HR 106 | Temp 98.2°F | Ht 64.0 in | Wt 135.4 lb

## 2015-09-18 DIAGNOSIS — C3492 Malignant neoplasm of unspecified part of left bronchus or lung: Secondary | ICD-10-CM

## 2015-09-18 DIAGNOSIS — I82409 Acute embolism and thrombosis of unspecified deep veins of unspecified lower extremity: Secondary | ICD-10-CM

## 2015-09-18 DIAGNOSIS — C3490 Malignant neoplasm of unspecified part of unspecified bronchus or lung: Secondary | ICD-10-CM | POA: Insufficient documentation

## 2015-09-18 DIAGNOSIS — Z5181 Encounter for therapeutic drug level monitoring: Secondary | ICD-10-CM | POA: Diagnosis not present

## 2015-09-18 LAB — POCT INR: INR: 2.1

## 2015-09-18 MED ORDER — ENOXAPARIN SODIUM 100 MG/ML ~~LOC~~ SOLN: 1.5000 mg/kg | SUBCUTANEOUS | Status: DC

## 2015-09-18 NOTE — Progress Notes (Signed)
Pre visit review using our clinic review tool, if applicable. No additional management support is needed unless otherwise documented below in the visit note. 

## 2015-09-18 NOTE — Patient Instructions (Signed)
Start Lovenox once daily subcutaneous Stop Coumadin

## 2015-09-18 NOTE — Progress Notes (Signed)
   Subjective:    Patient ID: Jenny [name], female    DOB: 05/08/1950, 65 y.o.   MRN: [id number]  HPI Patient seen back in follow-up regarding her recently diagnosed left lung mass She ended up going to [facility] in [place]. She has a multidisciplinary team which has been put in place including oncologist, pulmonologist, radiologist, nutritionist. She will be starting treatment there soon. 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 She had evidence for lymph node involvement in the left lung but no right lung involvement and no spread beyond the lung. She will be starting radiation and chemotherapy very soon.  Physicians there had expressed interest in her coming off Coumadin and being treated instead with Lovenox. She has a long history of recurrent DVT and has been on Coumadin for several years. No recent bleeding concerns. Her overall treatment program will be approximately 6 weeks  Past Medical History  Diagnosis Date  . DVT (deep venous thrombosis) ([place])     1 year ago  . Hypercholesteremia    Past Surgical History  Procedure Laterality Date  . Cesarean section      reports that she quit smoking about 4 weeks ago. Her smoking use included Cigarettes. She has a 39 pack-year smoking history. She does not have any smokeless tobacco history on file. She reports that she does not drink alcohol or use illicit drugs. family history includes Cancer in her father; Hypertension in her mother; Stroke in her mother. Allergies  Allergen Reactions  . Morphine And Related Itching and Rash      Review of Systems  Constitutional: Negative for fever and chills.  Respiratory: Negative for cough and shortness of breath.   Cardiovascular: Negative for chest pain, palpitations and leg swelling.       Objective:   Physical Exam  Constitutional: She appears well-developed and well-nourished.  Cardiovascular: Normal rate and regular rhythm.     Pulmonary/Chest: Effort normal.  Decreased aeration left base compared to right. No wheezes. No rales.  Musculoskeletal: She exhibits no edema.  Psychiatric: She has a normal mood and affect. Her behavior is normal.          Assessment & Plan:  Small cell lung cancer. Patient will be getting treatment in [place]. They've requested her transitioning from Coumadin to Lovenox. We'll start Lovenox 90 mg subcutaneous once daily and discontinue Coumadin. Prescription given for 2 months  Eulas Post MD [facility]

## 2015-10-07 ENCOUNTER — Ambulatory Visit: Payer: Self-pay

## 2015-10-21 ENCOUNTER — Other Ambulatory Visit: Payer: Self-pay | Admitting: Family Medicine

## 2015-10-21 DIAGNOSIS — C3492 Malignant neoplasm of unspecified part of left bronchus or lung: Secondary | ICD-10-CM

## 2015-10-22 ENCOUNTER — Other Ambulatory Visit (INDEPENDENT_AMBULATORY_CARE_PROVIDER_SITE_OTHER): Payer: BLUE CROSS/BLUE SHIELD

## 2015-10-22 DIAGNOSIS — C3492 Malignant neoplasm of unspecified part of left bronchus or lung: Secondary | ICD-10-CM

## 2015-10-22 LAB — CBC WITH DIFFERENTIAL/PLATELET
BASOS ABS: 0 10*3/uL (ref 0.0–0.1)
BASOS PCT: 0 % (ref 0.0–3.0)
EOS ABS: 0 10*3/uL (ref 0.0–0.7)
Eosinophils Relative: 0.1 % (ref 0.0–5.0)
HEMATOCRIT: 38.9 % (ref 36.0–46.0)
HEMOGLOBIN: 13 g/dL (ref 12.0–15.0)
Lymphs Abs: 0.1 10*3/uL — ABNORMAL LOW (ref 0.7–4.0)
MCHC: 33.5 g/dL (ref 30.0–36.0)
MCV: 81.6 fl (ref 78.0–100.0)
MONO ABS: 0 10*3/uL — AB (ref 0.1–1.0)
Monocytes Relative: 0.4 % — ABNORMAL LOW (ref 3.0–12.0)
Neutro Abs: 5.3 10*3/uL (ref 1.4–7.7)
Neutrophils Relative %: 97.4 % — ABNORMAL HIGH (ref 43.0–77.0)
Platelets: 209 10*3/uL (ref 150.0–400.0)
RBC: 4.77 Mil/uL (ref 3.87–5.11)
RDW: 14.1 % (ref 11.5–15.5)
WBC: 5.4 10*3/uL (ref 4.0–10.5)

## 2015-10-23 ENCOUNTER — Telehealth: Payer: Self-pay | Admitting: Family Medicine

## 2015-10-23 NOTE — Telephone Encounter (Signed)
Jenny [name] stated some numbers are not clear.  If you can just call her she said she can take those few numbers from you over the phone, or either can refax if need be.

## 2015-10-23 NOTE — Telephone Encounter (Signed)
Left message for Jerene Pitch to call back.

## 2015-10-29 ENCOUNTER — Other Ambulatory Visit: Payer: BLUE CROSS/BLUE SHIELD

## 2015-11-08 ENCOUNTER — Telehealth: Payer: Self-pay | Admitting: Family Medicine

## 2015-11-08 DIAGNOSIS — R1310 Dysphagia, unspecified: Secondary | ICD-10-CM

## 2015-11-08 NOTE — Telephone Encounter (Signed)
Spoke with pt's sister [name] and they will reschedule the lab work when she gets back from Chemotherapy in [place].

## 2015-11-08 NOTE — Telephone Encounter (Signed)
Pt called to see if the GI appt was made.  Please give sister a called.

## 2015-11-08 NOTE — Telephone Encounter (Signed)
Set up GI referral ASAP.

## 2015-11-08 NOTE — Telephone Encounter (Signed)
Urgent referral entered.

## 2015-11-08 NOTE — Telephone Encounter (Signed)
Okay for referral [id number]? Spoke with pts sister and she was in ICU over the weekend due to this issue.

## 2015-11-08 NOTE — Telephone Encounter (Signed)
Pt need to have a referral to a GI due to her being unable to swallow.  Pt is taking chem and they [facility]) state that she needs to find out why she is unable to swallow they are giving her medication so that she will be able to swallow until she gets a appointment.

## 2015-11-08 NOTE — Telephone Encounter (Signed)
Left Rozlynn a message to call back.

## 2015-11-11 ENCOUNTER — Inpatient Hospital Stay (HOSPITAL_COMMUNITY)
Admission: EM | Admit: 2015-11-11 | Discharge: 2015-11-12 | DRG: 381 | Disposition: A | Discharge status: Home / Self Care | Payer: BLUE CROSS/BLUE SHIELD | Attending: Internal Medicine | Admitting: Internal Medicine

## 2015-11-11 ENCOUNTER — Telehealth: Payer: Self-pay | Admitting: Family Medicine

## 2015-11-11 ENCOUNTER — Telehealth: Payer: Self-pay | Admitting: Gastroenterology

## 2015-11-11 ENCOUNTER — Emergency Department (HOSPITAL_COMMUNITY): Payer: BLUE CROSS/BLUE SHIELD

## 2015-11-11 ENCOUNTER — Encounter (HOSPITAL_COMMUNITY): Payer: Self-pay | Admitting: Emergency Medicine

## 2015-11-11 DIAGNOSIS — K208 Other esophagitis: Secondary | ICD-10-CM | POA: Diagnosis not present

## 2015-11-11 DIAGNOSIS — Z7901 Long term (current) use of anticoagulants: Secondary | ICD-10-CM | POA: Diagnosis not present

## 2015-11-11 DIAGNOSIS — E785 Hyperlipidemia, unspecified: Secondary | ICD-10-CM | POA: Diagnosis present

## 2015-11-11 DIAGNOSIS — T451X5A Adverse effect of antineoplastic and immunosuppressive drugs, initial encounter: Secondary | ICD-10-CM | POA: Diagnosis present

## 2015-11-11 DIAGNOSIS — Z885 Allergy status to narcotic agent status: Secondary | ICD-10-CM | POA: Diagnosis not present

## 2015-11-11 DIAGNOSIS — Z6820 Body mass index (BMI) 20.0-20.9, adult: Secondary | ICD-10-CM | POA: Diagnosis not present

## 2015-11-11 DIAGNOSIS — E7800 Pure hypercholesterolemia, unspecified: Secondary | ICD-10-CM | POA: Diagnosis present

## 2015-11-11 DIAGNOSIS — C3492 Malignant neoplasm of unspecified part of left bronchus or lung: Secondary | ICD-10-CM

## 2015-11-11 DIAGNOSIS — Z9221 Personal history of antineoplastic chemotherapy: Secondary | ICD-10-CM

## 2015-11-11 DIAGNOSIS — Z87891 Personal history of nicotine dependence: Secondary | ICD-10-CM

## 2015-11-11 DIAGNOSIS — K209 Esophagitis, unspecified without bleeding: Secondary | ICD-10-CM | POA: Diagnosis present

## 2015-11-11 DIAGNOSIS — Z79899 Other long term (current) drug therapy: Secondary | ICD-10-CM

## 2015-11-11 DIAGNOSIS — E440 Moderate protein-calorie malnutrition: Secondary | ICD-10-CM | POA: Diagnosis present

## 2015-11-11 DIAGNOSIS — R1310 Dysphagia, unspecified: Secondary | ICD-10-CM

## 2015-11-11 DIAGNOSIS — Z823 Family history of stroke: Secondary | ICD-10-CM

## 2015-11-11 DIAGNOSIS — Z8249 Family history of ischemic heart disease and other diseases of the circulatory system: Secondary | ICD-10-CM | POA: Diagnosis not present

## 2015-11-11 DIAGNOSIS — D72819 Decreased white blood cell count, unspecified: Secondary | ICD-10-CM | POA: Diagnosis present

## 2015-11-11 DIAGNOSIS — Z923 Personal history of irradiation: Secondary | ICD-10-CM

## 2015-11-11 DIAGNOSIS — C3402 Malignant neoplasm of left main bronchus: Secondary | ICD-10-CM

## 2015-11-11 DIAGNOSIS — E876 Hypokalemia: Secondary | ICD-10-CM | POA: Diagnosis present

## 2015-11-11 DIAGNOSIS — R630 Anorexia: Secondary | ICD-10-CM | POA: Insufficient documentation

## 2015-11-11 DIAGNOSIS — K2210 Ulcer of esophagus without bleeding: Principal | ICD-10-CM | POA: Diagnosis present

## 2015-11-11 DIAGNOSIS — E860 Dehydration: Secondary | ICD-10-CM | POA: Diagnosis present

## 2015-11-11 DIAGNOSIS — E871 Hypo-osmolality and hyponatremia: Secondary | ICD-10-CM | POA: Diagnosis present

## 2015-11-11 DIAGNOSIS — Y842 Radiological procedure and radiotherapy as the cause of abnormal reaction of the patient, or of later complication, without mention of misadventure at the time of the procedure: Secondary | ICD-10-CM | POA: Diagnosis present

## 2015-11-11 DIAGNOSIS — E861 Hypovolemia: Secondary | ICD-10-CM | POA: Diagnosis present

## 2015-11-11 DIAGNOSIS — Z800 Family history of malignant neoplasm of digestive organs: Secondary | ICD-10-CM | POA: Diagnosis not present

## 2015-11-11 HISTORY — DX: Malignant neoplasm of unspecified part of unspecified bronchus or lung: C34.90

## 2015-11-11 LAB — CBC WITH DIFFERENTIAL/PLATELET
BASOS ABS: 0 10*3/uL (ref 0.0–0.1)
Basophils Relative: 0 %
EOS ABS: 0 10*3/uL (ref 0.0–0.7)
Eosinophils Relative: 0 %
HCT: 29.8 % — ABNORMAL LOW (ref 36.0–46.0)
HEMOGLOBIN: 10.1 g/dL — AB (ref 12.0–15.0)
LYMPHS PCT: 5 %
Lymphs Abs: 0.1 10*3/uL — ABNORMAL LOW (ref 0.7–4.0)
MCH: 28.1 pg (ref 26.0–34.0)
MCHC: 33.9 g/dL (ref 30.0–36.0)
MCV: 82.8 fL (ref 78.0–100.0)
Monocytes Absolute: 0.1 10*3/uL (ref 0.1–1.0)
Monocytes Relative: 3 %
NEUTROS PCT: 92 %
Neutro Abs: 2.3 10*3/uL (ref 1.7–7.7)
Platelets: 265 10*3/uL (ref 150–400)
RBC: 3.6 MIL/uL — AB (ref 3.87–5.11)
RDW: 15.5 % (ref 11.5–15.5)
WBC: 2.5 10*3/uL — AB (ref 4.0–10.5)

## 2015-11-11 LAB — COMPREHENSIVE METABOLIC PANEL
ALT: 23 U/L (ref 14–54)
ANION GAP: 11 (ref 5–15)
AST: 21 U/L (ref 15–41)
Albumin: 3.3 g/dL — ABNORMAL LOW (ref 3.5–5.0)
Alkaline Phosphatase: 45 U/L (ref 38–126)
BILIRUBIN TOTAL: 1.1 mg/dL (ref 0.3–1.2)
BUN: 19 mg/dL (ref 6–20)
CO2: 25 mmol/L (ref 22–32)
Calcium: 8.8 mg/dL — ABNORMAL LOW (ref 8.9–10.3)
Chloride: 96 mmol/L — ABNORMAL LOW (ref 101–111)
Creatinine, Ser: 0.75 mg/dL (ref 0.44–1.00)
GFR calc Af Amer: >60 mL/min (ref >60)
Glucose, Bld: 104 mg/dL — ABNORMAL HIGH (ref 65–99)
POTASSIUM: 3.4 mmol/L — AB (ref 3.5–5.1)
Sodium: 132 mmol/L — ABNORMAL LOW (ref 135–145)
TOTAL PROTEIN: 6 g/dL — AB (ref 6.5–8.1)

## 2015-11-11 LAB — LIPASE, BLOOD: LIPASE: 17 U/L (ref 11–51)

## 2015-11-11 MED ORDER — SODIUM CHLORIDE 0.9% FLUSH: 3.0000 mL | INTRAVENOUS | Status: DC | PRN

## 2015-11-11 MED ORDER — SODIUM CHLORIDE 0.9 % IV SOLN
INTRAVENOUS | Status: AC
  Administered 2015-11-11: 17:00:00 via INTRAVENOUS

## 2015-11-11 MED ORDER — MAGIC MOUTHWASH W/LIDOCAINE
5.0000 mL | Freq: Four times a day (QID) | ORAL | Status: DC
  Administered 2015-11-12: 5 mL via ORAL
  Filled 2015-11-11 (×5): qty 5

## 2015-11-11 MED ORDER — ALUM & MAG HYDROXIDE-SIMETH 200-200-20 MG/5ML PO SUSP
15.0000 mL | Freq: Every day | ORAL | Status: DC | PRN
Start: 2015-11-11 — End: 2015-11-12

## 2015-11-11 MED ORDER — SODIUM CHLORIDE 0.9 % IV BOLUS (SEPSIS)
1000.0000 mL | Freq: Once | INTRAVENOUS | Status: AC
  Administered 2015-11-11: 1000 mL via INTRAVENOUS

## 2015-11-11 MED ORDER — FLUCONAZOLE 40 MG/ML PO SUSR
200.0000 mg | Freq: Once | ORAL | Status: DC
  Filled 2015-11-11 (×2): qty 5

## 2015-11-11 MED ORDER — BOOST / RESOURCE BREEZE PO LIQD: 1.0000 | Freq: Three times a day (TID) | ORAL | Status: DC

## 2015-11-11 MED ORDER — VITAMIN D (ERGOCALCIFEROL) 1.25 MG (50000 UNIT) PO CAPS: 50000.0000 [IU] | ORAL_CAPSULE | ORAL | Status: DC

## 2015-11-11 MED ORDER — SODIUM CHLORIDE 0.9 % IV SOLN
INTRAVENOUS | Status: DC
  Administered 2015-11-12: 15:00:00 via INTRAVENOUS

## 2015-11-11 MED ORDER — FENTANYL CITRATE (PF) 100 MCG/2ML IJ SOLN
12.5000 ug | INTRAMUSCULAR | Status: DC | PRN
  Administered 2015-11-12: 12.5 ug via INTRAVENOUS
  Filled 2015-11-11: qty 2

## 2015-11-11 MED ORDER — FENTANYL CITRATE (PF) 100 MCG/2ML IJ SOLN
50.0000 ug | INTRAMUSCULAR | Status: DC | PRN
  Administered 2015-11-11: 50 ug via INTRAVENOUS
  Filled 2015-11-11: qty 2

## 2015-11-11 MED ORDER — SODIUM CHLORIDE 0.9 % IV SOLN: 250.0000 mL | INTRAVENOUS | Status: DC | PRN

## 2015-11-11 MED ORDER — POTASSIUM CHLORIDE 10 MEQ/100ML IV SOLN
10.0000 meq | INTRAVENOUS | Status: AC
  Administered 2015-11-11 – 2015-11-12 (×3): 10 meq via INTRAVENOUS
  Filled 2015-11-11 (×4): qty 100

## 2015-11-11 MED ORDER — ATORVASTATIN CALCIUM 10 MG PO TABS
10.0000 mg | ORAL_TABLET | Freq: Every day | ORAL | Status: DC
  Administered 2015-11-12: 10 mg via ORAL
  Filled 2015-11-11 (×3): qty 1

## 2015-11-11 MED ORDER — ALPRAZOLAM 0.25 MG PO TABS: 0.2500 mg | ORAL_TABLET | Freq: Every day | ORAL | Status: DC | PRN

## 2015-11-11 MED ORDER — SODIUM CHLORIDE 0.9% FLUSH: 3.0000 mL | Freq: Two times a day (BID) | INTRAVENOUS | Status: DC

## 2015-11-11 MED ORDER — ONDANSETRON HCL 4 MG/2ML IJ SOLN
4.0000 mg | Freq: Four times a day (QID) | INTRAMUSCULAR | Status: DC | PRN
  Administered 2015-11-11: 4 mg via INTRAVENOUS
  Filled 2015-11-11: qty 2

## 2015-11-11 MED ORDER — ALBUTEROL SULFATE (2.5 MG/3ML) 0.083% IN NEBU: 2.5000 mg | INHALATION_SOLUTION | RESPIRATORY_TRACT | Status: DC | PRN

## 2015-11-11 MED ORDER — CHLORHEXIDINE GLUCONATE 0.12 % MT SOLN
15.0000 mL | Freq: Two times a day (BID) | OROMUCOSAL | Status: DC
  Administered 2015-11-12: 15 mL via OROMUCOSAL
  Filled 2015-11-11 (×2): qty 15

## 2015-11-11 MED ORDER — PROCHLORPERAZINE MALEATE 10 MG PO TABS: 10.0000 mg | ORAL_TABLET | Freq: Every day | ORAL | Status: DC | PRN

## 2015-11-11 MED ORDER — SUCRALFATE 1 GM/10ML PO SUSP
1.0000 g | Freq: Four times a day (QID) | ORAL | Status: DC
  Administered 2015-11-11 – 2015-11-12 (×2): 1 g via ORAL
  Filled 2015-11-11 (×2): qty 10

## 2015-11-11 MED ORDER — FENTANYL CITRATE (PF) 100 MCG/2ML IJ SOLN
100.0000 ug | Freq: Once | INTRAMUSCULAR | Status: AC
  Administered 2015-11-11: 100 ug via INTRAVENOUS
  Filled 2015-11-11: qty 2

## 2015-11-11 MED ORDER — FAMOTIDINE IN NACL 20-0.9 MG/50ML-% IV SOLN
20.0000 mg | Freq: Two times a day (BID) | INTRAVENOUS | Status: DC
  Administered 2015-11-12 (×2): 20 mg via INTRAVENOUS
  Filled 2015-11-11 (×2): qty 50

## 2015-11-11 MED ORDER — FLUCONAZOLE 40 MG/ML PO SUSR
100.0000 mg | Freq: Every day | ORAL | Status: DC
  Filled 2015-11-11: qty 2.5

## 2015-11-11 MED ORDER — LIDOCAINE VISCOUS 2 % MT SOLN
15.0000 mL | OROMUCOSAL | Status: DC | PRN
  Administered 2015-11-11: 15 mL via OROMUCOSAL
  Filled 2015-11-11 (×2): qty 15

## 2015-11-11 MED ORDER — OXYCODONE HCL 5 MG/5ML PO SOLN: 15.0000 mg | Freq: Two times a day (BID) | ORAL | Status: DC | PRN

## 2015-11-11 MED ORDER — ENOXAPARIN SODIUM 100 MG/ML ~~LOC~~ SOLN: 1.5000 mg/kg | SUBCUTANEOUS | Status: DC

## 2015-11-11 MED ORDER — DEXTROSE-NACL 5-0.9 % IV SOLN
INTRAVENOUS | Status: DC
  Administered 2015-11-12 (×2): 1000 mL via INTRAVENOUS

## 2015-11-11 MED ORDER — ACETAMINOPHEN 500 MG PO TABS: 500.0000 mg | ORAL_TABLET | Freq: Four times a day (QID) | ORAL | Status: DC | PRN

## 2015-11-11 MED ORDER — CETYLPYRIDINIUM CHLORIDE 0.05 % MT LIQD: 7.0000 mL | Freq: Two times a day (BID) | OROMUCOSAL | Status: DC

## 2015-11-11 MED ORDER — ALBUTEROL SULFATE HFA 108 (90 BASE) MCG/ACT IN AERS: 2.0000 | INHALATION_SPRAY | RESPIRATORY_TRACT | Status: DC | PRN

## 2015-11-11 NOTE — Telephone Encounter (Signed)
Referral has been placed. Please see other message on this pt dated 11/11/15.

## 2015-11-11 NOTE — Telephone Encounter (Signed)
Left message for patient to call back  

## 2015-11-11 NOTE — Telephone Encounter (Signed)
Advised to present to [facility] ER for evaluation.

## 2015-11-11 NOTE — Consult Note (Signed)
Consultation  Referring Provider:   Dr. Cathlean Sauer   Primary Care Physician:  Eulas Post, MD Primary Gastroenterologist: Out of town    Reason for Consultation: Odynophagia             HPI:   Jenny [name] is a 65 y.o. Caucasian female with a past medical history of small cell lung cancer, HLD and DVT who presented to the ER on 11/11/2015 with a chief complaint of "painful swallowing". She is accompanied by her sister who assists with her history.   Today, the patient tells me that she was diagnosed with small cell lung cancer in April of this year and has been undergoing treatments at the [facility] in [place]. She underwent 33 treatments of radiation and 3 rounds of chemotherapy with Cisplatin and one another chemotherapy agent, with her last treatment being on 11/08/15. The patient describes that over the past month or so she has been treated on 2 separate occasions for suspected thrush. She describes a combination of magic mouthwash, Carafate, nystatin and fluconazole. She has not been on an anti-fungal for at least a week. Patient describes feeling somewhat better last week after her recent treatment, but her symptoms gradually increased and worsened on Friday, 11/08/15. Patient describes that it feels like she is "swallowing glass", even swallowing her secretions is painful. She does describe calling her "case manager" in [place] and being told to proceed to the doctor for worsening symptoms. The patient has been eating only soft substances including pudding, applesauce and water, but these are very painful to swallow and in fact this morning she felt like she "could not swallow it all", due to the pain. Patient does describe becoming quite panicked this morning as she felt as if her "throat was closing". Patient has described increase in pain upon laying back in her bed. She does tell me that treatment in the past has helped, though her symptoms never completely resolved.  Associated symptoms include lightheadedness.  Past medical history is positive for being on Lovenox since beginning chemotherapy. Patient's social history is positive for increased stress due to her own medical history as well as her sons. She was excited to "plan will be going home", and is uneasy about staying hospitalized.  Patient denies fever, chills, decrease in appetite, dysphagia, heartburn, reflux, nausea, vomiting, abdominal pain, change in bowel habits or use of NSAIDs.  Past Medical History  Diagnosis Date  . DVT (deep venous thrombosis) ([place])     1 year ago  . Hypercholesteremia   . Lung cancer [facility])     Past Surgical History  Procedure Laterality Date  . Cesarean section      Family History  Problem Relation Age of Onset  . Stroke Mother   . Hypertension Mother   . Cancer Father     colon    Social History  Substance Use Topics  . Smoking status: Former Smoker -- 1.00 packs/day for 39 years    Types: Cigarettes    Quit date: 08/20/2015  . Smokeless tobacco: None  . Alcohol Use: No    Prior to Admission medications   Medication Sig Start Date End Date Taking? Authorizing Provider  albuterol (PROAIR HFA) 108 (90 Base) MCG/ACT inhaler Inhale 2 puffs into the lungs every 4 (four) hours as needed for wheezing or shortness of breath. 07/17/15  Yes Eulas Post, MD  ALPRAZolam Duanne Moron) 0.25 MG tablet Take 0.25 mg by mouth daily as needed for anxiety  or sleep.  09/10/15  Yes Historical Provider, MD  atorvastatin (LIPITOR) 10 MG tablet TAKE 1 TABLET BY MOUTH EVERY DAY Patient taking differently: TAKE 10 MG BY MOUTH EVERY DAY 05/20/15  Yes Eulas Post, MD  enoxaparin (LOVENOX) 100 MG/ML injection Inject 0.9 mLs (90 mg total) into the skin daily. 09/18/15  Yes Eulas Post, MD  MAALOX ADVANCED 200-200-20 MG/5ML suspension Take 1 Dose by mouth daily as needed for indigestion.  11/03/15  Yes Historical Provider, MD  magic mouthwash w/lidocaine SOLN Take 5 mLs by  mouth 4 (four) times daily. 11/03/15  Yes Historical Provider, MD  ondansetron (ZOFRAN-ODT) 8 MG disintegrating tablet Take 8 mg by mouth daily as needed for nausea or vomiting.  09/16/15  Yes Historical Provider, MD  oxyCODONE (ROXICODONE) 5 MG/5ML solution Take 15 mg by mouth 2 (two) times daily as needed for severe pain.  11/06/15  Yes Historical Provider, MD  prochlorperazine (COMPAZINE) 10 MG tablet Take 10 mg by mouth daily as needed for nausea or vomiting.  09/16/15  Yes Historical Provider, MD  sucralfate (CARAFATE) 1 GM/10ML suspension Take 1 g by mouth 4 (four) times daily.   Yes Historical Provider, MD  Vitamin D, Ergocalciferol, (DRISDOL) 50000 UNITS CAPS capsule TAKE ONE CAPSULE BY MOUTH ONCE A WEEK ON THURSDAYS 03/04/15  Yes Eulas Post, MD  budesonide-formoterol (SYMBICORT) 160-4.5 MCG/ACT inhaler Inhale 2 puffs into the lungs 2 (two) times daily. Patient not taking: Reported on 11/11/2015 08/28/15   Rush Landmark, MD    Current Facility-Administered Medications  Medication Dose Route Frequency Provider Last Rate Last Dose  . 0.9 %  sodium chloride infusion   Intravenous STAT Margette Fast, MD      . fentaNYL (SUBLIMAZE) injection 50 mcg  50 mcg Intravenous Q1H PRN Margette Fast, MD      . lidocaine (XYLOCAINE) 2 % viscous mouth solution 15 mL  15 mL Mouth/Throat Q4H PRN Levin Erp, [place]       Current Outpatient Prescriptions  Medication Sig Dispense Refill  . albuterol (PROAIR HFA) 108 (90 Base) MCG/ACT inhaler Inhale 2 puffs into the lungs every 4 (four) hours as needed for wheezing or shortness of breath. 1 Inhaler 2  . ALPRAZolam (XANAX) 0.25 MG tablet Take 0.25 mg by mouth daily as needed for anxiety or sleep.   0  . atorvastatin (LIPITOR) 10 MG tablet TAKE 1 TABLET BY MOUTH EVERY DAY (Patient taking differently: TAKE 10 MG BY MOUTH EVERY DAY) 90 tablet 1  . enoxaparin (LOVENOX) 100 MG/ML injection Inject 0.9 mLs (90 mg total) into the skin daily. 30 Syringe 1    . MAALOX ADVANCED 200-200-20 MG/5ML suspension Take 1 Dose by mouth daily as needed for indigestion.   0  . magic mouthwash w/lidocaine SOLN Take 5 mLs by mouth 4 (four) times daily.  0  . ondansetron (ZOFRAN-ODT) 8 MG disintegrating tablet Take 8 mg by mouth daily as needed for nausea or vomiting.   2  . oxyCODONE (ROXICODONE) 5 MG/5ML solution Take 15 mg by mouth 2 (two) times daily as needed for severe pain.   0  . prochlorperazine (COMPAZINE) 10 MG tablet Take 10 mg by mouth daily as needed for nausea or vomiting.   2  . sucralfate (CARAFATE) 1 GM/10ML suspension Take 1 g by mouth 4 (four) times daily.    . Vitamin D, Ergocalciferol, (DRISDOL) 50000 UNITS CAPS capsule TAKE ONE CAPSULE BY MOUTH ONCE A WEEK ON THURSDAYS  12 capsule 2  . budesonide-formoterol (SYMBICORT) 160-4.5 MCG/ACT inhaler Inhale 2 puffs into the lungs 2 (two) times daily. (Patient not taking: Reported on 11/11/2015) 1 Inhaler 6    Allergies as of 11/11/2015 - Review Complete 11/11/2015  Allergen Reaction Noted  . Morphine and related Itching and Rash 03/05/2015     Review of Systems:    Constitutional: Positive for weight loss No  fever, chills, weakness or fatigue HEENT: Eyes: No change in vision               Ears, Nose, Throat:  No change in hearing or congestion Skin: No rash or itching Cardiovascular: No chest pain or palpitations Respiratory: No SOB or cough Gastrointestinal: See HPI and otherwise negative Genitourinary: No dysuria or hematuria Neurological: No headache, dizziness or syncope Musculoskeletal: No muscle or back pain Psychiatric: Positive for anxiety No history of depression   Physical Exam:  Vital signs in last 24 hours: Temp:  [98 F (36.7 C)] 98 F (36.7 C) (07/10 1334) Pulse Rate:  [99-144] 99 (07/10 1430) Resp:  [15-20] 15 (07/10 1430) BP: (102-117)/(77-85) 102/77 mmHg (07/10 1430) SpO2:  [99 %-100 %] 99 % (07/10 1430) Weight:  [121 lb (54.885 kg)] 121 lb (54.885 kg) (07/10  1513)   General:   Pleasant Caucasian female appears to be in NAD, Well developed, Well nourished, alert and cooperative Head:  Normocephalic and atraumatic. Eyes:   PEERL, EOMI. No icterus. Conjunctiva pink. Ears:  Normal auditory acuity. Neck:  Supple Throat: Oral cavity and pharynx without inflammation, swelling or lesion.  Lungs: Respirations even and unlabored. Lungs clear to auscultation bilaterally.   No wheezes, crackles, or rhonchi.  Heart: Normal S1, S2. No MRG. Regular rate and rhythm. No peripheral edema, cyanosis or pallor.  Abdomen:  Soft, nondistended, nontender. No rebound or guarding. Normal bowel sounds. No appreciable masses or hepatomegaly. Rectal:  Not performed.  Msk:  Symmetrical without gross deformities. Peripheral pulses intact.  Extremities:  Without edema, no deformity or joint abnormality. Normal ROM Neurologic:  Alert and  oriented x4;  grossly normal neurologically.  Skin:   Dry and intact without significant lesions or rashes. Psychiatric: Oriented to person, place and time. Demonstrates good judgement and reason without abnormal affect or behaviors.   LAB RESULTS:  Recent Labs  11/11/15 1446  WBC 2.5*  HGB 10.1*  HCT 29.8*  PLT 265   BMET  Recent Labs  11/11/15 1446  NA 132*  K 3.4*  CL 96*  CO2 25  GLUCOSE 104*  BUN 19  CREATININE 0.75  CALCIUM 8.8*   LFT  Recent Labs  11/11/15 1446  PROT 6.0*  ALBUMIN 3.3*  AST 21  ALT 23  ALKPHOS 45  BILITOT 1.1   PT/INR No results for input(s): LABPROT, INR in the last 72 hours.  STUDIES: Dg Chest 2 View  11/11/2015  CLINICAL DATA:  History of lung carcinoma with difficulty breathing and difficulty swallowing EXAM: CHEST  2 VIEW COMPARISON:  08/19/2015 FINDINGS: Cardiac shadow is within normal limits. The previously seen left lower lobe mass and left perihilar density have resolved in the interval. No focal infiltrate or sizable effusion is seen. No bony abnormality is noted. IMPRESSION:  No active cardiopulmonary disease. Electronically Signed   By: Inez Catalina M.D.   On: 11/11/2015 14:27     PREVIOUS ENDOSCOPIES:            EGD and Colonoscopy 5 years ago per patient, we do not have reports of  this.   Impression / Plan:  Impression: 1. Odynophagia: Patient with history of radiation and chemotherapy recently for lung cancer, completed chemotherapy on Friday, 11/08/15, her history has been treated twice recently for thrush with a mixture of Carafate, nystatin, fluconazole and Magic mouthwash, most recently a week ago-per patient, symptoms have never completely resolved; consider side effect from radiation versus thrush versus viral cause versus other 2. Anorexia: due to above  Plan: 1. EGD scheduled for tomorrow with Dr. Loletha Carrow for further evaluation. Risks, benefits, limitations and alternatives of this procedure were discussed with the patient and she agrees to proceed. 2. Patient may remain on clear liquids as tolerated until midnight, then nothing by mouth 3. Ordered Viscous Lidocaine every 4 hours as needed for throat pain until midnight. 4. Hold Lovenox 5. Discussed above with Dr. Loletha Carrow 6. Please await further recommendations after time procedure tomorrow.  Thank you for your kind consultation, we will continue to follow.  Lavone Nian Lemmon  11/11/2015, 4:59 PM Pager #: [phone]

## 2015-11-11 NOTE — ED Provider Notes (Signed)
Emergency Department Provider Note  Time seen: Approximately 2:32 PM  I have reviewed the triage vital signs and the nursing notes.   HISTORY  Chief Complaint Dysphagia and Shortness of Breath  HPI Jenny [name] is a 65 y.o. female with past medical history of small cell lung cancer recently finishing chemotherapy and radiation presents to the emergency department with progressively worsening severe throat pain. She notes primarily difficulty swallowing because of pain in her throat. She denies any food or water or getting stuck in her throat. She doesn't minimally feel like she's having difficulty breathing. Denies any bloody emesis. No associated chest discomfort. No abdominal pain. No fever or shaking chills. She was treated for thrush. Did have some throat pain who got magic mouthwash two weeks prior.   Past Medical History  Diagnosis Date  . DVT (deep venous thrombosis) ([place])     1 year ago  . Hypercholesteremia   . Lung cancer [facility])     Patient Active Problem List   Diagnosis Date Noted  . Small cell lung cancer ([place]) 09/18/2015  . CAP (community acquired pneumonia) 08/28/2015  . Hemoptysis 08/28/2015  . COPD (chronic obstructive pulmonary disease) ([place]) 08/28/2015  . Lung mass 08/26/2015  . Other acute sinusitis 10/27/2013  . Anticoagulant long-term use 10/27/2013  . Cough over 2 weeks 10/27/2013  . Encounter for therapeutic drug monitoring 10/05/2013  . Hyperlipidemia 11/04/2011  . DVT, lower extremity, recurrent ([place]) 11/04/2011  . Nicotine use disorder 11/04/2011    Past Surgical History  Procedure Laterality Date  . Cesarean section      Current Outpatient Rx  Name  Route  Sig  Dispense  Refill  . albuterol (PROAIR HFA) 108 (90 Base) MCG/ACT inhaler   Inhalation   Inhale 2 puffs into the lungs every 4 (four) hours as needed for wheezing or shortness of breath.   1 Inhaler   2   . ALPRAZolam (XANAX) 0.25 MG tablet   Oral   Take 0.25 mg by mouth  daily as needed for anxiety or sleep.       0   . atorvastatin (LIPITOR) 10 MG tablet      TAKE 1 TABLET BY MOUTH EVERY DAY Patient taking differently: TAKE 10 MG BY MOUTH EVERY DAY   90 tablet   1   . enoxaparin (LOVENOX) 100 MG/ML injection   Subcutaneous   Inject 0.9 mLs (90 mg total) into the skin daily.   30 Syringe   1   . MAALOX ADVANCED 200-200-20 MG/5ML suspension   Oral   Take 1 Dose by mouth daily as needed for indigestion.       0     Dispense as written.   . magic mouthwash w/lidocaine SOLN   Oral   Take 5 mLs by mouth 4 (four) times daily.      0   . ondansetron (ZOFRAN-ODT) 8 MG disintegrating tablet   Oral   Take 8 mg by mouth daily as needed for nausea or vomiting.       2   . oxyCODONE (ROXICODONE) 5 MG/5ML solution   Oral   Take 15 mg by mouth 2 (two) times daily as needed for severe pain.       0   . prochlorperazine (COMPAZINE) 10 MG tablet   Oral   Take 10 mg by mouth daily as needed for nausea or vomiting.       2   . sucralfate (CARAFATE) 1 GM/10ML suspension  Oral   Take 1 g by mouth 4 (four) times daily.         . Vitamin D, Ergocalciferol, (DRISDOL) 50000 UNITS CAPS capsule      TAKE ONE CAPSULE BY MOUTH ONCE A WEEK ON THURSDAYS   12 capsule   2   . budesonide-formoterol (SYMBICORT) 160-4.5 MCG/ACT inhaler   Inhalation   Inhale 2 puffs into the lungs 2 (two) times daily. Patient not taking: Reported on 11/11/2015   1 Inhaler   6     Allergies Morphine and related  Family History  Problem Relation Age of Onset  . Stroke Mother   . Hypertension Mother   . Cancer Father     colon    Social History Social History  Substance Use Topics  . Smoking status: Former Smoker -- 1.00 packs/day for 39 years    Types: Cigarettes    Quit date: 08/20/2015  . Smokeless tobacco: None  . Alcohol Use: No    Review of Systems  Constitutional: No fever/chills Eyes: No visual changes. ENT: Severe sore  throat. Cardiovascular: Denies chest pain. Respiratory: Intermittent shortness of breath. Gastrointestinal: No abdominal pain.  No nausea, no vomiting.  No diarrhea.  No constipation. Genitourinary: Negative for dysuria. Musculoskeletal: Negative for back pain. Skin: Negative for rash. Neurological: Negative for headaches, focal weakness or numbness.  10-point ROS otherwise negative.  ____________________________________________   PHYSICAL EXAM:  VITAL SIGNS: ED Triage Vitals  Enc Vitals Group     BP 11/11/15 1334 117/85 mmHg     Pulse Rate 11/11/15 1334 144     Resp 11/11/15 1334 20     Temp 11/11/15 1334 98 F (36.7 C)     Temp Source 11/11/15 1334 Oral     SpO2 11/11/15 1334 100 %     Pain Score 11/11/15 1345 10   Constitutional: Alert and oriented. Tearful during exam.  Eyes: Conjunctivae are normal. PERRL.  Head: Atraumatic. Nose: No congestion/rhinnorhea. Mouth/Throat: Mucous membranes are moist.  Oropharynx non-erythematous. No obvious thrush.  Neck: No stridor.  No meningeal signs. Cardiovascular: Sinus tachycardia. Good peripheral circulation. Grossly normal heart sounds.   Respiratory: Normal respiratory effort.  No retractions. Lungs CTAB. Gastrointestinal: Soft and nontender. No distention.  Musculoskeletal: No lower extremity tenderness nor edema. No gross deformities of extremities. Neurologic:  Normal speech and language. No gross focal neurologic deficits are appreciated.  Skin:  Skin is warm, dry and intact. No rash noted. Psychiatric: Mood and affect are normal. Speech and behavior are normal.  ____________________________________________   LABS (all labs ordered are listed, but only abnormal results are displayed)  Labs Reviewed  COMPREHENSIVE METABOLIC PANEL - Abnormal; Notable for the following:    Sodium 132 (*)    Potassium 3.4 (*)    Chloride 96 (*)    Glucose, Bld 104 (*)    Calcium 8.8 (*)    Total Protein 6.0 (*)    Albumin 3.3 (*)     All other components within normal limits  CBC WITH DIFFERENTIAL/PLATELET - Abnormal; Notable for the following:    WBC 2.5 (*)    RBC 3.60 (*)    Hemoglobin 10.1 (*)    HCT 29.8 (*)    Lymphs Abs 0.1 (*)    All other components within normal limits  LIPASE, BLOOD   ____________________________________________  EKG  Reviewed in MUSE.  ____________________________________________  RADIOLOGY  Dg Chest 2 View  11/11/2015  CLINICAL DATA:  History of lung carcinoma with difficulty breathing and  difficulty swallowing EXAM: CHEST  2 VIEW COMPARISON:  08/19/2015 FINDINGS: Cardiac shadow is within normal limits. The previously seen left lower lobe mass and left perihilar density have resolved in the interval. No focal infiltrate or sizable effusion is seen. No bony abnormality is noted. IMPRESSION: No active cardiopulmonary disease. Electronically Signed   By: Inez Catalina M.D.   On: 11/11/2015 14:27    ____________________________________________   PROCEDURES  Procedure(s) performed:   Procedures  None ____________________________________________   INITIAL IMPRESSION / ASSESSMENT AND PLAN / ED COURSE  Pertinent labs & imaging results that were available during my care of the patient were reviewed by me and considered in my medical decision making (see chart for details).  Patient resistance to the emergency department for evaluation of severe dysphagia in the setting of recent chemotherapy and radiation for small cell lung carcinoma. No associated fevers. Patient's oncologist is in [place]. On exam the patient is tearful but with no dyspnea. Airway is patent. She is managing oral secretions. There is suspicion for esophageal stricture. Given recent chemotherapy and radiation had some concern for esophagitis or radiation induced changes.    03:46 PM  Placed GI consult. Will discuss upper endoscopy while inpatient.   04:06 PM Spoke with GI. Paged hospitalist regarding admission  for IVF, electrolyte abnormality, and possible upper endoscopy.   Discussed patient's case with Hospitalist, Dr. Cathlean Sauer.  Recommend admission to med-surg, inpatient bed.  I will place holding orders per their request. Patient and family (if present) updated with plan. Care transferred to hospitalist service.  I reviewed all nursing notes, vitals, pertinent old records, EKGs, labs, imaging (as available).   ____________________________________________  FINAL CLINICAL IMPRESSION(S) / ED DIAGNOSES  Final diagnoses:  Dysphagia  Small cell lung cancer, left (HCC)     MEDICATIONS GIVEN DURING THIS VISIT:  Medications  fentaNYL (SUBLIMAZE) injection 50 mcg (not administered)  0.9 %  sodium chloride infusion (not administered)  sodium chloride 0.9 % bolus 1,000 mL (1,000 mLs Intravenous New Bag/Given 11/11/15 1515)  fentaNYL (SUBLIMAZE) injection 100 mcg (100 mcg Intravenous Given 11/11/15 1515)     NEW OUTPATIENT MEDICATIONS STARTED DURING THIS VISIT:  None    Note:  This document was prepared using Dragon voice recognition software and may include unintentional dictation errors.  Nanda Quinton, MD Emergency Medicine  Margette Fast, MD 11/11/15 [phone]

## 2015-11-11 NOTE — ED Notes (Signed)
Bed: WA02 Expected date:  Expected time:  Means of arrival:  Comments: Tr 6

## 2015-11-11 NOTE — ED Notes (Signed)
Pt has hx of lung cancer and just finished radiation and chemotherapy last week in [place]. Over the past week pt has felt her esophagus tighten and tighten over the past week. Pt is unable to swallow or eat anything. Pt tearful in triage. Pt also c/o sudden onset SOB x 2 hours. Denies chest pain but c/o severe pain down her esophagus. A&Ox4 and ambulatory.

## 2015-11-11 NOTE — H&P (Signed)
History and Physical    Jenny [name] NFA:[id number] DOB: [birth year] DOA: 11/11/2015  PCP: Eulas Post, MD   Patient coming from: Home  Chief Complaint: Odynophagia  HPI: Jenny [name] is a 65 y.o. female presents to the hospital with 3 days of worsening odynophagia. Her pain is severe in intensity, it has been associated with decreased oral intake, denies any fever or chills. There is no radiation, no improving factors and it is worse with meals.  Her odynophagia has progressively getting worse to the point where she is not able to tolerate liquids.  Patient has been recently diagnosed with a localized small cell lung cancer, she has underwent chemotherapy and radiation therapy. She finished her treatment about 3 days ago. She received the radiation to her left hemithorax, 32 cycles.  During her cancer therapy she has been treated for esophageal thrush.  ED Course: Patient has received IV fluids, oral lidocaine, and IV analgesics. Gastrology has been consulted  Review of Systems:  Gen. Denies any fever chills. Cardiovascular. No angina, claudication, no syncope. Pulmonary. No shortness of breath, cough or hemoptysis. Gastrointestinal odynophagia as mentioned in his present illness, no vomiting or abdominal pain. Musculoskeletal. No joint pain Dermatology no rashes Urology no dysuria or increased urinary frequency Endocrine no tremors, heat or cold tolerance Hematology no easy bruisability or frequent infections Psych no depression or anxiety   Past Medical History  Diagnosis Date  . DVT (deep venous thrombosis) ([place])     1 year ago  . Hypercholesteremia   . Lung cancer [facility])     Past Surgical History  Procedure Laterality Date  . Cesarean section       reports that she quit smoking about 2 months ago. Her smoking use included Cigarettes. She has a 39 pack-year smoking history. She does not have any smokeless tobacco history on file. She reports that she does not  drink alcohol or use illicit drugs.  Allergies  Allergen Reactions  . Morphine And Related Itching and Rash    Family History  Problem Relation Age of Onset  . Stroke Mother   . Hypertension Mother   . Cancer Father     colon   Family history positive for cardiovascular disease  Prior to Admission medications   Medication Sig Start Date End Date Taking? Authorizing Provider  albuterol (PROAIR HFA) 108 (90 Base) MCG/ACT inhaler Inhale 2 puffs into the lungs every 4 (four) hours as needed for wheezing or shortness of breath. 07/17/15  Yes Eulas Post, MD  ALPRAZolam Duanne Moron) 0.25 MG tablet Take 0.25 mg by mouth daily as needed for anxiety or sleep.  09/10/15  Yes Historical Provider, MD  atorvastatin (LIPITOR) 10 MG tablet TAKE 1 TABLET BY MOUTH EVERY DAY Patient taking differently: TAKE 10 MG BY MOUTH EVERY DAY 05/20/15  Yes Eulas Post, MD  enoxaparin (LOVENOX) 100 MG/ML injection Inject 0.9 mLs (90 mg total) into the skin daily. 09/18/15  Yes Eulas Post, MD  MAALOX ADVANCED 200-200-20 MG/5ML suspension Take 1 Dose by mouth daily as needed for indigestion.  11/03/15  Yes Historical Provider, MD  magic mouthwash w/lidocaine SOLN Take 5 mLs by mouth 4 (four) times daily. 11/03/15  Yes Historical Provider, MD  ondansetron (ZOFRAN-ODT) 8 MG disintegrating tablet Take 8 mg by mouth daily as needed for nausea or vomiting.  09/16/15  Yes Historical Provider, MD  oxyCODONE (ROXICODONE) 5 MG/5ML solution Take 15 mg by mouth 2 (two) times daily as needed for severe pain.  11/06/15  Yes Historical Provider, MD  prochlorperazine (COMPAZINE) 10 MG tablet Take 10 mg by mouth daily as needed for nausea or vomiting.  09/16/15  Yes Historical Provider, MD  sucralfate (CARAFATE) 1 GM/10ML suspension Take 1 g by mouth 4 (four) times daily.   Yes Historical Provider, MD  Vitamin D, Ergocalciferol, (DRISDOL) 50000 UNITS CAPS capsule TAKE ONE CAPSULE BY MOUTH ONCE A WEEK ON THURSDAYS 03/04/15  Yes Eulas Post, MD  budesonide-formoterol (SYMBICORT) 160-4.5 MCG/ACT inhaler Inhale 2 puffs into the lungs 2 (two) times daily. Patient not taking: Reported on 11/11/2015 08/28/15   Rush Landmark, MD    Physical Exam: Filed Vitals:   11/11/15 1430 11/11/15 1500 11/11/15 1513 11/11/15 1700  BP: 102/77 95/74  143/93  Pulse: 99 95    Temp:      TempSrc:      Resp: '15 11  12  '$ Height:   '5\' 5"'$  (1.651 m)   Weight:   54.885 kg (121 lb)   SpO2: 99% 98%        Constitutional: NAD, calm, comfortable Filed Vitals:   11/11/15 1430 11/11/15 1500 11/11/15 1513 11/11/15 1700  BP: 102/77 95/74  143/93  Pulse: 99 95    Temp:      TempSrc:      Resp: '15 11  12  '$ Height:   '5\' 5"'$  (1.651 m)   Weight:   54.885 kg (121 lb)   SpO2: 99% 98%     Eyes: PERRL, lids and conjunctivae with pallor ENMT: Mucous membranes are dry. Posterior pharynx clear of any exudate or lesions.Normal dentition. No thrush. Neck: normal, supple, no masses, no thyromegaly Respiratory: clear to auscultation bilaterally, no wheezing, no crackles. Normal respiratory effort. No accessory muscle use. Bilateral vesicular  Breath sounds. Cardiovascular: Regular rate and rhythm, no murmurs / rubs / gallops. No extremity edema. 2+ pedal pulses. No carotid bruits.  Abdomen: no tenderness, no masses palpated. No hepatosplenomegaly. Bowel sounds positive.  Musculoskeletal: no clubbing / cyanosis. No joint deformity upper and lower extremities. Good ROM, no contractures. Normal muscle tone.  Skin: no rashes, lesions, ulcers. No induration Neurologic: CN 2-12 grossly intact. Sensation intact, DTR normal. Strength 5/5 in all 4.  Psychiatric: Normal judgment and insight. Alert and oriented x 3. Normal mood.   Labs on Admission: I have personally reviewed following labs and imaging studies  CBC:  Recent Labs Lab 11/11/15 1446  WBC 2.5*  NEUTROABS 2.3  HGB 10.1*  HCT 29.8*  MCV 82.8  PLT 300   Basic Metabolic Panel:  Recent  Labs Lab 11/11/15 1446  NA 132*  K 3.4*  CL 96*  CO2 25  GLUCOSE 104*  BUN 19  CREATININE 0.75  CALCIUM 8.8*   GFR: Estimated Creatinine Clearance: 63.2 mL/min (by C-G formula based on Cr of 0.75). Liver Function Tests:  Recent Labs Lab 11/11/15 1446  AST 21  ALT 23  ALKPHOS 45  BILITOT 1.1  PROT 6.0*  ALBUMIN 3.3*    Recent Labs Lab 11/11/15 1446  LIPASE 17   No results for input(s): AMMONIA in the last 168 hours. Coagulation Profile: No results for input(s): INR, PROTIME in the last 168 hours. Cardiac Enzymes: No results for input(s): CKTOTAL, CKMB, CKMBINDEX, TROPONINI in the last 168 hours. BNP (last 3 results) No results for input(s): PROBNP in the last 8760 hours. HbA1C: No results for input(s): HGBA1C in the last 72 hours. CBG: No results for input(s): GLUCAP in the last  168 hours. Lipid Profile: No results for input(s): CHOL, HDL, LDLCALC, TRIG, CHOLHDL, LDLDIRECT in the last 72 hours. Thyroid Function Tests: No results for input(s): TSH, T4TOTAL, FREET4, T3FREE, THYROIDAB in the last 72 hours. Anemia Panel: No results for input(s): VITAMINB12, FOLATE, FERRITIN, TIBC, IRON, RETICCTPCT in the last 72 hours. Urine analysis:    Component Value Date/Time   COLORURINE YELLOW 08/19/2015 2259   APPEARANCEUR CLEAR 08/19/2015 2259   LABSPEC 1.017 08/19/2015 2259   PHURINE 5.5 08/19/2015 2259   GLUCOSEU NEGATIVE 08/19/2015 2259   HGBUR TRACE* 08/19/2015 2259   BILIRUBINUR NEGATIVE 08/19/2015 2259   BILIRUBINUR negative 04/18/2015 0856   KETONESUR NEGATIVE 08/19/2015 2259   PROTEINUR NEGATIVE 08/19/2015 2259   PROTEINUR negative 04/18/2015 0856   UROBILINOGEN 0.2 04/18/2015 0856   NITRITE NEGATIVE 08/19/2015 2259   NITRITE negative 04/18/2015 0856   LEUKOCYTESUR SMALL* 08/19/2015 2259   Sepsis Labs: !!!!!!!!!!!!!!!!!!!!!!!!!!!!!!!!!!!!!!!!!!!! '@LABRCNTIP'$ (procalcitonin:4,lacticidven:4) )No results found for this or any previous visit (from the past  240 hour(s)).   Radiological Exams on Admission: Dg Chest 2 View  11/11/2015  CLINICAL DATA:  History of lung carcinoma with difficulty breathing and difficulty swallowing EXAM: CHEST  2 VIEW COMPARISON:  08/19/2015 FINDINGS: Cardiac shadow is within normal limits. The previously seen left lower lobe mass and left perihilar density have resolved in the interval. No focal infiltrate or sizable effusion is seen. No bony abnormality is noted. IMPRESSION: No active cardiopulmonary disease. Electronically Signed   By: Inez Catalina M.D.   On: 11/11/2015 14:27   Personally reviewed chest film with positive hyperinflation but no effusion or pneumothorax.   EKG: Independently reviewed. Normal sinus rhythm positive PVC. No elevations or ST depressions, no significant T-wave abnormalities.  Assessment/Plan Active Problems:   Dysphagia   Esophagitis   This is a 65 year old female who has been diagnosed with localized small cell lung carcinoma. Patient has finished 32 cycles of radiation therapy to her left hemithorax. For last 3 days she has been developing worsening odinophagie to the point where she is not able to tolerate clear liquids. On initial physical examination her initial blood pressures 102/77, her heart rate 81, respiratory rate is  14 with oxygen saturation of 100% on room air. Her oral mucosa has no evidence of thrush, her lungs are clear to auscultation. Her serum sodium is 132, potassium 3.4, creatinine 0.75, glucose 104, white count is 2.5, hemoglobin 10.1, platelet count 265. Her EKG is negative for ischemia and her chest and was showing hyperinflation.  Working diagnosis odynophagia rule out radiation-induced esophagitis rule out  Candida esophagitis. Complicated by hypokalemia, hyponatremia and leukopenia.  1. Cardiovascular. Patient hemodynamically stable we'll continue IV fluids with normal saline at 100 miles per hour. Will be cautious not to produce volume overload  2. Pulmonary.  No signs of volume overload or pulmonary infection. Continue to monitor oximetry, continue supplemental oxygen per nasal cannula to target O2 saturation more than 92%  3. Nephrology. Hypokalemia and hyponatremia. Probably due to hypovolemia. Patient will received potassium supplements intravenously with potassium chloride 40 mEq, follow-up renal function morning. We'll continue hydration with isotonic saline at 100 ml per hour. The kidney function is preserved with a creatinine of 0.75  4. Gastrointestinal. Esophagitis. We'll continue supportive care with IV fluids and IV opiates for pain control. Will start patient on antiacid therapy with famotidine twice daily and will start empirically therapy with the fluconazole. Patient is scheduled for upper endoscopy in the morning. Continue IV antiemetics.  Patient is in moderate  risk of worsening esophagitis, dehydration and electrolyte disturbances.  DVT prophylaxis: Full anticoagulation Code Status: Full Family Communication:  Disposition Plan: Home Consults called: Gastroenterology Admission status: Inpatient  Shakesha Soltau Gerome Apley MD [facility] Pager [phone]  If 7PM-7AM, please contact night-coverage [email or web address] Password [email or web address]  11/11/2015, 6:08 PM

## 2015-11-11 NOTE — Telephone Encounter (Signed)
Patient checked into ED 

## 2015-11-11 NOTE — Telephone Encounter (Signed)
Pt would like to have some advise she is home from chemo.

## 2015-11-11 NOTE — Telephone Encounter (Signed)
[facility] - Client [facility] Patient Name: Jenny [name] DOB: 12-19-1950 Initial Comment Caller states sister cannot swallow, eat or drink. Nurse Assessment Nurse: Dimas Chyle, RN, Dellis Filbert Date/Time Eilene Ghazi Time): 11/11/2015 9:13:34 AM Confirm and document reason for call. If symptomatic, describe symptoms. You must click the next button to save text entered. ---Caller states sister cannot swallow, eat or drink. Being treated for lung cancer. Last chemo treatment was 11/08/15. Has completed radiation treatment. Has small cell lung carcinoma. No fever. Has the patient traveled out of the country within the last 30 days? ---No Does the patient have any new or worsening symptoms? ---Yes Will a triage be completed? ---Yes Related visit to physician within the last 2 weeks? ---Yes Does the PT have any chronic conditions? (i.e. diabetes, asthma, etc.) ---Yes List chronic conditions. ---Lung cancer, DVT Is this a behavioral health or substance abuse call? ---No Guidelines Guideline Title Affirmed Question Affirmed Notes Swallowing Difficulty [1] Drinking very little AND [2] dehydration suspected (e.g., no urine > 12 hours, very dry mouth, very lightheaded) Final Disposition User Go to ED Now (or PCP triage) Dimas Chyle, RN, [place] - ED Disagree/Comply: Comply

## 2015-11-12 ENCOUNTER — Encounter: Payer: Self-pay | Admitting: *Deleted

## 2015-11-12 ENCOUNTER — Encounter (HOSPITAL_COMMUNITY)
Admission: EM | Disposition: A | Discharge status: Home / Self Care | Payer: Self-pay | Source: Home / Self Care | Attending: Internal Medicine

## 2015-11-12 ENCOUNTER — Encounter (HOSPITAL_COMMUNITY): Payer: Self-pay

## 2015-11-12 DIAGNOSIS — E440 Moderate protein-calorie malnutrition: Secondary | ICD-10-CM | POA: Insufficient documentation

## 2015-11-12 HISTORY — PX: ESOPHAGOGASTRODUODENOSCOPY (EGD) WITH PROPOFOL: SHX5813

## 2015-11-12 LAB — BASIC METABOLIC PANEL
ANION GAP: 6 (ref 5–15)
BUN: 10 mg/dL (ref 6–20)
CHLORIDE: 103 mmol/L (ref 101–111)
CO2: 23 mmol/L (ref 22–32)
CREATININE: 0.61 mg/dL (ref 0.44–1.00)
Calcium: 8.2 mg/dL — ABNORMAL LOW (ref 8.9–10.3)
GFR calc non Af Amer: >60 mL/min (ref >60)
Glucose, Bld: 129 mg/dL — ABNORMAL HIGH (ref 65–99)
Potassium: 4 mmol/L (ref 3.5–5.1)
Sodium: 132 mmol/L — ABNORMAL LOW (ref 135–145)

## 2015-11-12 LAB — CBC
HCT: 26.1 % — ABNORMAL LOW (ref 36.0–46.0)
HEMOGLOBIN: 8.7 g/dL — AB (ref 12.0–15.0)
MCH: 27.8 pg (ref 26.0–34.0)
MCHC: 33.3 g/dL (ref 30.0–36.0)
MCV: 83.4 fL (ref 78.0–100.0)
Platelets: 178 10*3/uL (ref 150–400)
RBC: 3.13 MIL/uL — AB (ref 3.87–5.11)
RDW: 15.7 % — ABNORMAL HIGH (ref 11.5–15.5)
WBC: 1 10*3/uL — CL (ref 4.0–10.5)

## 2015-11-12 LAB — GLUCOSE, CAPILLARY: Glucose-Capillary: 137 mg/dL — ABNORMAL HIGH (ref 65–99)

## 2015-11-12 SURGERY — ESOPHAGOGASTRODUODENOSCOPY (EGD) WITH PROPOFOL
Anesthesia: Moderate Sedation

## 2015-11-12 MED ORDER — POTASSIUM CHLORIDE 10 MEQ/100ML IV SOLN
10.0000 meq | Freq: Once | INTRAVENOUS | Status: AC
  Administered 2015-11-12: 10 meq via INTRAVENOUS
  Filled 2015-11-12: qty 100

## 2015-11-12 MED ORDER — LIDOCAINE VISCOUS 2 % MT SOLN: 15.0000 mL | Freq: Four times a day (QID) | OROMUCOSAL | Status: DC | PRN

## 2015-11-12 MED ORDER — DIPHENHYDRAMINE HCL 50 MG/ML IJ SOLN
INTRAMUSCULAR | Status: AC
  Filled 2015-11-12: qty 1

## 2015-11-12 MED ORDER — FENTANYL CITRATE (PF) 100 MCG/2ML IJ SOLN
INTRAMUSCULAR | Status: DC | PRN
  Administered 2015-11-12 (×2): 25 ug via INTRAVENOUS

## 2015-11-12 MED ORDER — MIDAZOLAM HCL 5 MG/ML IJ SOLN
INTRAMUSCULAR | Status: AC
  Filled 2015-11-12: qty 2

## 2015-11-12 MED ORDER — LIDOCAINE VISCOUS 2 % MT SOLN
15.0000 mL | Freq: Four times a day (QID) | OROMUCOSAL | Status: DC | PRN
  Filled 2015-11-12: qty 15

## 2015-11-12 MED ORDER — BUTAMBEN-TETRACAINE-BENZOCAINE 2-2-14 % EX AERO
INHALATION_SPRAY | CUTANEOUS | Status: DC | PRN
  Administered 2015-11-12: 1 via TOPICAL

## 2015-11-12 MED ORDER — MIDAZOLAM HCL 10 MG/2ML IJ SOLN
INTRAMUSCULAR | Status: DC | PRN
  Administered 2015-11-12: 2 mg via INTRAVENOUS
  Administered 2015-11-12 (×3): 1 mg via INTRAVENOUS

## 2015-11-12 MED ORDER — FENTANYL CITRATE (PF) 100 MCG/2ML IJ SOLN
INTRAMUSCULAR | Status: AC
  Filled 2015-11-12: qty 2

## 2015-11-12 MED ORDER — FENTANYL CITRATE (PF) 100 MCG/2ML IJ SOLN
25.0000 ug | INTRAMUSCULAR | Status: DC | PRN
  Administered 2015-11-12 (×2): 25 ug via INTRAVENOUS
  Filled 2015-11-12 (×2): qty 2

## 2015-11-12 MED ORDER — CETYLPYRIDINIUM CHLORIDE 0.05 % MT LIQD: 7.0000 mL | Freq: Two times a day (BID) | OROMUCOSAL | Status: DC

## 2015-11-12 SURGICAL SUPPLY — 14 items

## 2015-11-12 NOTE — Progress Notes (Signed)
Patient  S/p upper Endoscopy, patient is stable, denies pain. Will continue to monitor.

## 2015-11-12 NOTE — Interval H&P Note (Signed)
History and Physical Interval Note:  11/12/2015 2:44 PM  Jenny [name]  has presented today for surgery, with the diagnosis of odynophagia  The various methods of treatment have been discussed with the patient and family. After consideration of risks, benefits and other options for treatment, the patient has consented to  Procedure(s) with comments: ESOPHAGOGASTRODUODENOSCOPY (EGD) WITH PROPOFOL (N/A) - With propofol if available as a surgical intervention .  The patient's history has been reviewed, patient examined, no change in status, stable for surgery.  I have reviewed the patient's chart and labs.  Questions were answered to the patient's satisfaction.     Nelida Meuse III

## 2015-11-12 NOTE — Progress Notes (Signed)
Oncology Nurse Navigator Documentation  Oncology Nurse Navigator Flowsheets 11/12/2015  Navigator Encounter Type Other  Patient Visit Type Inpatient  Treatment Phase Post-Tx Follow-up  Barriers/Navigation Needs Education;Coordination of Care  Education Other  Interventions Coordination of Care;Education Method  Coordination of Care Appts  Education Method Verbal  Acuity Level 2  Acuity Level 2 Educational needs;Other  Time Spent with Patient 17   I received referral on Jenny [name] today.  Records from [name] are confusing.  I noticed patient was in the hospital.  I went to see her in the hospital.  I asked about the treatment she has received.  I listened as she explained.  [name] completed [place] for limited stage small cell lung cancer.  She will be going back there to get PCI Aug 15th.  She would like to see Dr. Benay Spice for a second opinion.    I explained that I will update Dr. Benay Spice and she will be scheduled in the next few weeks for a second opinion.  I asked that she follow up with PCP or [place] until that time.

## 2015-11-12 NOTE — H&P (View-Only) (Signed)
Consultation  Referring Provider:   Dr. Cathlean Sauer   Primary Care Physician:  Eulas Post, MD Primary Gastroenterologist: Out of town    Reason for Consultation: Odynophagia             HPI:   Jenny [name] is a 65 y.o. Caucasian female with a past medical history of small cell lung cancer, HLD and DVT who presented to the ER on 11/11/2015 with a chief complaint of "painful swallowing". She is accompanied by her sister who assists with her history.   Today, the Jenny [name] tells me that she was diagnosed with small cell lung cancer in April of this year and has been undergoing treatments at the [facility] in [place]. She underwent 33 treatments of radiation and 3 rounds of chemotherapy with Cisplatin and one another chemotherapy agent, with her last treatment being on 11/08/15. The Jenny [name] describes that over the past month or so she has been treated on 2 separate occasions for suspected thrush. She describes a combination of magic mouthwash, Carafate, nystatin and fluconazole. She has not been on an anti-fungal for at least a week. Jenny [name] describes feeling somewhat better last week after her recent treatment, but her symptoms gradually increased and worsened on Friday, 11/08/15. Jenny [name] describes that it feels like she is "swallowing glass", even swallowing her secretions is painful. She does describe calling her "case manager" in [place] and being told to proceed to the doctor for worsening symptoms. The Jenny [name] has been eating only soft substances including pudding, applesauce and water, but these are very painful to swallow and in fact this morning she felt like she "could not swallow it all", due to the pain. Jenny [name] does describe becoming quite panicked this morning as she felt as if her "throat was closing". Jenny [name] has described increase in pain upon laying back in her bed. She does tell me that treatment in the past has helped, though her symptoms never completely resolved.  Associated symptoms include lightheadedness.  Past medical history is positive for being on Lovenox since beginning chemotherapy. Jenny [name]'s social history is positive for increased stress due to her own medical history as well as her sons. She was excited to "plan will be going home", and is uneasy about staying hospitalized.  Jenny [name] denies fever, chills, decrease in appetite, dysphagia, heartburn, reflux, nausea, vomiting, abdominal pain, change in bowel habits or use of NSAIDs.  Past Medical History  Diagnosis Date  . DVT (deep venous thrombosis) ([place])     1 year ago  . Hypercholesteremia   . Lung cancer [facility])     Past Surgical History  Procedure Laterality Date  . Cesarean section      Family History  Problem Relation Age of Onset  . Stroke Mother   . Hypertension Mother   . Cancer Father     colon    Social History  Substance Use Topics  . Smoking status: Former Smoker -- 1.00 packs/day for 39 years    Types: Cigarettes    Quit date: 08/20/2015  . Smokeless tobacco: None  . Alcohol Use: No    Prior to Admission medications   Medication Sig Start Date End Date Taking? Authorizing Provider  albuterol (PROAIR HFA) 108 (90 Base) MCG/ACT inhaler Inhale 2 puffs into the lungs every 4 (four) hours as needed for wheezing or shortness of breath. 07/17/15  Yes Eulas Post, MD  ALPRAZolam Duanne Moron) 0.25 MG tablet Take 0.25 mg by mouth daily as needed for anxiety  or sleep.  09/10/15  Yes Historical Provider, MD  atorvastatin (LIPITOR) 10 MG tablet TAKE 1 TABLET BY MOUTH EVERY DAY Jenny [name] taking differently: TAKE 10 MG BY MOUTH EVERY DAY 05/20/15  Yes Eulas Post, MD  enoxaparin (LOVENOX) 100 MG/ML injection Inject 0.9 mLs (90 mg total) into the skin daily. 09/18/15  Yes Eulas Post, MD  MAALOX ADVANCED 200-200-20 MG/5ML suspension Take 1 Dose by mouth daily as needed for indigestion.  11/03/15  Yes Historical Provider, MD  magic mouthwash w/lidocaine SOLN Take 5 mLs by  mouth 4 (four) times daily. 11/03/15  Yes Historical Provider, MD  ondansetron (ZOFRAN-ODT) 8 MG disintegrating tablet Take 8 mg by mouth daily as needed for nausea or vomiting.  09/16/15  Yes Historical Provider, MD  oxyCODONE (ROXICODONE) 5 MG/5ML solution Take 15 mg by mouth 2 (two) times daily as needed for severe pain.  11/06/15  Yes Historical Provider, MD  prochlorperazine (COMPAZINE) 10 MG tablet Take 10 mg by mouth daily as needed for nausea or vomiting.  09/16/15  Yes Historical Provider, MD  sucralfate (CARAFATE) 1 GM/10ML suspension Take 1 g by mouth 4 (four) times daily.   Yes Historical Provider, MD  Vitamin D, Ergocalciferol, (DRISDOL) 50000 UNITS CAPS capsule TAKE ONE CAPSULE BY MOUTH ONCE A WEEK ON THURSDAYS 03/04/15  Yes Eulas Post, MD  budesonide-formoterol (SYMBICORT) 160-4.5 MCG/ACT inhaler Inhale 2 puffs into the lungs 2 (two) times daily. Jenny [name] not taking: Reported on 11/11/2015 08/28/15   Rush Landmark, MD    Current Facility-Administered Medications  Medication Dose Route Frequency Provider Last Rate Last Dose  . 0.9 %  sodium chloride infusion   Intravenous STAT Margette Fast, MD      . fentaNYL (SUBLIMAZE) injection 50 mcg  50 mcg Intravenous Q1H PRN Margette Fast, MD      . lidocaine (XYLOCAINE) 2 % viscous mouth solution 15 mL  15 mL Mouth/Throat Q4H PRN Levin Erp, [place]       Current Outpatient Prescriptions  Medication Sig Dispense Refill  . albuterol (PROAIR HFA) 108 (90 Base) MCG/ACT inhaler Inhale 2 puffs into the lungs every 4 (four) hours as needed for wheezing or shortness of breath. 1 Inhaler 2  . ALPRAZolam (XANAX) 0.25 MG tablet Take 0.25 mg by mouth daily as needed for anxiety or sleep.   0  . atorvastatin (LIPITOR) 10 MG tablet TAKE 1 TABLET BY MOUTH EVERY DAY (Jenny [name] taking differently: TAKE 10 MG BY MOUTH EVERY DAY) 90 tablet 1  . enoxaparin (LOVENOX) 100 MG/ML injection Inject 0.9 mLs (90 mg total) into the skin daily. 30 Syringe 1    . MAALOX ADVANCED 200-200-20 MG/5ML suspension Take 1 Dose by mouth daily as needed for indigestion.   0  . magic mouthwash w/lidocaine SOLN Take 5 mLs by mouth 4 (four) times daily.  0  . ondansetron (ZOFRAN-ODT) 8 MG disintegrating tablet Take 8 mg by mouth daily as needed for nausea or vomiting.   2  . oxyCODONE (ROXICODONE) 5 MG/5ML solution Take 15 mg by mouth 2 (two) times daily as needed for severe pain.   0  . prochlorperazine (COMPAZINE) 10 MG tablet Take 10 mg by mouth daily as needed for nausea or vomiting.   2  . sucralfate (CARAFATE) 1 GM/10ML suspension Take 1 g by mouth 4 (four) times daily.    . Vitamin D, Ergocalciferol, (DRISDOL) 50000 UNITS CAPS capsule TAKE ONE CAPSULE BY MOUTH ONCE A WEEK ON THURSDAYS  12 capsule 2  . budesonide-formoterol (SYMBICORT) 160-4.5 MCG/ACT inhaler Inhale 2 puffs into the lungs 2 (two) times daily. (Jenny [name] not taking: Reported on 11/11/2015) 1 Inhaler 6    Allergies as of 11/11/2015 - Review Complete 11/11/2015  Allergen Reaction Noted  . Morphine and related Itching and Rash 03/05/2015     Review of Systems:    Constitutional: Positive for weight loss No  fever, chills, weakness or fatigue HEENT: Eyes: No change in vision               Ears, Nose, Throat:  No change in hearing or congestion Skin: No rash or itching Cardiovascular: No chest pain or palpitations Respiratory: No SOB or cough Gastrointestinal: See HPI and otherwise negative Genitourinary: No dysuria or hematuria Neurological: No headache, dizziness or syncope Musculoskeletal: No muscle or back pain Psychiatric: Positive for anxiety No history of depression   Physical Exam:  Vital signs in last 24 hours: Temp:  [98 F (36.7 C)] 98 F (36.7 C) (07/10 1334) Pulse Rate:  [99-144] 99 (07/10 1430) Resp:  [15-20] 15 (07/10 1430) BP: (102-117)/(77-85) 102/77 mmHg (07/10 1430) SpO2:  [99 %-100 %] 99 % (07/10 1430) Weight:  [121 lb (54.885 kg)] 121 lb (54.885 kg) (07/10  1513)   General:   Pleasant Caucasian female appears to be in NAD, Well developed, Well nourished, alert and cooperative Head:  Normocephalic and atraumatic. Eyes:   PEERL, EOMI. No icterus. Conjunctiva pink. Ears:  Normal auditory acuity. Neck:  Supple Throat: Oral cavity and pharynx without inflammation, swelling or lesion.  Lungs: Respirations even and unlabored. Lungs clear to auscultation bilaterally.   No wheezes, crackles, or rhonchi.  Heart: Normal S1, S2. No MRG. Regular rate and rhythm. No peripheral edema, cyanosis or pallor.  Abdomen:  Soft, nondistended, nontender. No rebound or guarding. Normal bowel sounds. No appreciable masses or hepatomegaly. Rectal:  Not performed.  Msk:  Symmetrical without gross deformities. Peripheral pulses intact.  Extremities:  Without edema, no deformity or joint abnormality. Normal ROM Neurologic:  Alert and  oriented x4;  grossly normal neurologically.  Skin:   Dry and intact without significant lesions or rashes. Psychiatric: Oriented to person, place and time. Demonstrates good judgement and reason without abnormal affect or behaviors.   LAB RESULTS:  Recent Labs  11/11/15 1446  WBC 2.5*  HGB 10.1*  HCT 29.8*  PLT 265   BMET  Recent Labs  11/11/15 1446  NA 132*  K 3.4*  CL 96*  CO2 25  GLUCOSE 104*  BUN 19  CREATININE 0.75  CALCIUM 8.8*   LFT  Recent Labs  11/11/15 1446  PROT 6.0*  ALBUMIN 3.3*  AST 21  ALT 23  ALKPHOS 45  BILITOT 1.1   PT/INR No results for input(s): LABPROT, INR in the last 72 hours.  STUDIES: Dg Chest 2 View  11/11/2015  CLINICAL DATA:  History of lung carcinoma with difficulty breathing and difficulty swallowing EXAM: CHEST  2 VIEW COMPARISON:  08/19/2015 FINDINGS: Cardiac shadow is within normal limits. The previously seen left lower lobe mass and left perihilar density have resolved in the interval. No focal infiltrate or sizable effusion is seen. No bony abnormality is noted. IMPRESSION:  No active cardiopulmonary disease. Electronically Signed   By: Inez Catalina M.D.   On: 11/11/2015 14:27     PREVIOUS ENDOSCOPIES:            EGD and Colonoscopy 5 years ago per Jenny [name], we do not have reports of  this.   Impression / Plan:  Impression: 1. Odynophagia: Jenny [name] with history of radiation and chemotherapy recently for lung cancer, completed chemotherapy on Friday, 11/08/15, her history has been treated twice recently for thrush with a mixture of Carafate, nystatin, fluconazole and Magic mouthwash, most recently a week ago-per Jenny [name], symptoms have never completely resolved; consider side effect from radiation versus thrush versus viral cause versus other 2. Anorexia: due to above  Plan: 1. EGD scheduled for tomorrow with Dr. Loletha Carrow for further evaluation. Risks, benefits, limitations and alternatives of this procedure were discussed with the Jenny [name] and she agrees to proceed. 2. Jenny [name] may remain on clear liquids as tolerated until midnight, then nothing by mouth 3. Ordered Viscous Lidocaine every 4 hours as needed for throat pain until midnight. 4. Hold Lovenox 5. Discussed above with Dr. Loletha Carrow 6. Please await further recommendations after time procedure tomorrow.  Thank you for your kind consultation, we will continue to follow.  Lavone Nian Verda Mehta  11/11/2015, 4:59 PM Pager #: [phone]

## 2015-11-12 NOTE — Progress Notes (Signed)
Initial Nutrition Assessment  DOCUMENTATION CODES:   Non-severe (moderate) malnutrition in context of chronic illness  INTERVENTION:   -Diet advancement per MD -Once diet advanced, continue to provide Boost Breeze po TID, each supplement provides 250 kcal and 9 grams of protein -Provided strategies to help with thrush -RD to continue to monitor  NUTRITION DIAGNOSIS:   Inadequate oral intake related to dysphagia (odynophagia) as evidenced by NPO status.  GOAL:   Patient will meet greater than or equal to 90% of their needs  MONITOR:   Diet advancement, Labs, Weight trends, I & O's  REASON FOR ASSESSMENT:   Malnutrition Screening Tool    ASSESSMENT:   65 y.o. female presents to the hospital with 3 days of worsening odynophagia. Her pain is severe in intensity, it has been associated with decreased oral intake, denies any fever or chills. There is no radiation, no improving factors and it is worse with meals. Her odynophagia has progressively getting worse to the point where she is not able to tolerate liquids.  Patient in room with parents at bedside. Pt reports difficult and painful swallowing for 3 days PTA. Pt states that this has happened before with her cancer treatments. Pt has been using Magic Mouthwash but states it helps "a little". Pt finished chemo and XRT 3 days ago. Discussed strategies to help with thrush. Pt is currently NPO for EGD scheduled for this afternoon. Pt states that during this time she has not eaten much. At home she does drink Boost Breeze supplements which are ordered. Pt would like to continue these supplements upon diet advancement.  Per weight history, pt has lost 14 lb since 5/17 (10% wt loss x 2 months, significant for time frame). Pt presents with mild-moderate fat and mild-moderate muscle depletion.  Medications: Magic Mouthwash w/ Lidocaine QID, D5 and .9% NaCl infusion @ 100 ml/hr -provides 408 kcal Labs reviewed: CBGs: 137 Low Na  Diet  Order:  Diet NPO time specified  Skin:  Reviewed, no issues  Last BM:  7/8  Height:   Ht Readings from Last 1 Encounters:  11/11/15 '5\' 5"'$  (1.651 m)    Weight:   Wt Readings from Last 1 Encounters:  11/11/15 121 lb (54.885 kg)    Ideal Body Weight:  56.8 kg  BMI:  Body mass index is 20.14 kg/(m^2).  Estimated Nutritional Needs:   Kcal:  1650-1850  Protein:  85-95g  Fluid:  1.9L/day  EDUCATION NEEDS:   Education needs addressed  Clayton Bibles, MS, RD, LDN Pager: [phone] After Hours Pager: [phone]

## 2015-11-12 NOTE — Progress Notes (Signed)
Patient d/c home,stable. 

## 2015-11-12 NOTE — Progress Notes (Signed)
CRITICAL VALUE ALERT  Critical value received:  WbC 1.0  Date of notification:  11/12/15  Time of notification:  0745  Critical value read back:Yes.    Nurse who received alert:  Sandie Ano  MD notified (1st page): Dr. Cathlean Sauer, Mauricio  Time of first page:  [phone]  MD notified (2nd page):  Time of second page:  Responding MD:   MD notified via text page, no order   Time MD responded:  No order at this time

## 2015-11-12 NOTE — Op Note (Signed)
[facility] Patient Name: Jenny [name] Procedure Date: 11/12/2015 MRN: [id number] Attending MD: Estill Cotta. Loletha Carrow , MD Date of Birth: [birth year] CSN: [id number] Age: 65 Admit Type: Outpatient Procedure:                Upper GI endoscopy Indications:              Odynophagia Providers:                Mallie Mussel L. Loletha Carrow, MD, Cleda Daub, RN, William Dalton, Technician Referring MD:              Medicines:                Midazolam 5 mg IV, Fentanyl 50 micrograms IV,                            Benzocaine spray. first medication dose at 14:50,                            scope out at 15:02 (12 minute procedure time) Complications:            No immediate complications. Estimated Blood Loss:     Estimated blood loss was minimal. Procedure:                Pre-Anesthesia Assessment:                           - Prior to the procedure, a History and Physical                            was performed, and patient medications and                            allergies were reviewed. The patient's tolerance of                            previous anesthesia was also reviewed. The risks                            and benefits of the procedure and the sedation                            options and risks were discussed with the patient.                            All questions were answered, and informed consent                            was obtained. Prior Anticoagulants: The patient has                            taken Lovenox (enoxaparin), last dose was 1 day  prior to procedure. ASA Grade Assessment: III - A                            patient with severe systemic disease. After                            reviewing the risks and benefits, the patient was                            deemed in satisfactory condition to undergo the                            procedure.                           After obtaining informed consent, the  endoscope was                            passed under direct vision. Throughout the                            procedure, the patient's blood pressure, pulse, and                            oxygen saturations were monitored continuously. The                            EG-[id number] ([id number]) scope was introduced through the                            mouth, and advanced to the second part of duodenum.                            The upper GI endoscopy was accomplished without                            difficulty. The patient tolerated the procedure                            well. Scope In: Scope Out: Findings:      Well-demarcated circumferential ulcerated esophagitis with no bleeding       was found 20 to 30 cm from the incisors. A single biopsy was taken from       the proximal aspect with a cold forceps for histology to rule out viral       infection (though unlikely from the appearance).      The stomach was normal.      The cardia and gastric fundus were normal on retroflexion.      The examined duodenum was normal. Impression:               - Circumferential ulcerated chemotherapy/radiation                            esophagitis. Biopsied. R                           -  Normal stomach.                           - Normal examined duodenum. Moderate Sedation:      Moderate (conscious) sedation was administered by the endoscopy nurse       and supervised by the endoscopist. The following parameters were       monitored: oxygen saturation, heart rate, blood pressure, respiratory       rate, EKG, adequacy of pulmonary ventilation, and response to care.       Total physician intraservice time was 12 minutes. Recommendation:           - Resume previous diet.                           - Discontinue fluconazole                           Begin carafate slurry one gram by mouth 4 times                            daily                           Begin viscous lidocaine, 15 ml by mouth 4 times                             daily as needed for painful swallowing.                           Regular diet and protein calorie supplement drinks                            as tolerated.                           Follow up biopsy result.                           Lovenox can be resumed tomorrow AM.                           Patient can be discharged home today from a GI                            perspective.                           - Resume Lovenox (enoxaparin) at prior dose                            tomorrow. Procedure Code(s):        --- Professional ---                           [phone], Esophagogastroduodenoscopy, flexible,                            transoral; with  biopsy, single or multiple                           99152, Moderate sedation services provided by the                            same physician or other qualified health care                            professional performing the diagnostic or                            therapeutic service that the sedation supports,                            requiring the presence of an independent trained                            observer to assist in the monitoring of the                            patient's level of consciousness and physiological                            status; initial 15 minutes of intraservice time,                            patient age 99 years or older Diagnosis Code(s):        --- Professional ---                           K20.8, Other esophagitis                           T66.XXXA, Radiation sickness, unspecified, initial                            encounter                           R13.10, Dysphagia, unspecified CPT copyright 2016 [employer]. All rights reserved. The codes documented in this report are preliminary and upon coder review may  be revised to meet current compliance requirements. Reiana Poteet L. Loletha Carrow, MD 11/12/2015 3:24:55 PM This report has been signed electronically. Number of  Addenda: 0

## 2015-11-12 NOTE — Progress Notes (Signed)
Patient d/c instructions given,verbalized understanding. No c/o pain.

## 2015-11-12 NOTE — Discharge Summary (Addendum)
Jenny [name], is a 65 y.o. female  DOB 01/15/1951  MRN [id number].  Admission date:  11/11/2015  Admitting Physician  Serena Petterson Gerome Apley, MD  Discharge Date:  11/12/2015   Primary MD  Eulas Post, MD  Recommendations for primary care physician for things to follow:   Patient discharged back home. Patient has been placed on sucralfate and lidocaine viscous. Patient had a biopsy performed. Patient will follow-up with the gastrology clinic. Patient to follow-up with primary care physician within 7 days.   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 DVT (deep venous thrombosis) ([place])     1 year ago  . Hypercholesteremia   . Lung cancer [facility])     Past Surgical History  Procedure Laterality Date  . Cesarean section         HPI  from the history and physical done on the day of admission:    This is a 65 year old female presents to hospital with the chief complaint of odynophagia. Apparently patient has recently received radiation to her chest as a part of therapy for non-small cell lung carcinoma. She gradually had developed worsening odynophagia to the point where she was unable to eat clears quits. Initial physical examination she was clinically dehydrated, her sodium was 132 and potassium 3.2. Creatinine was 0.75.  Patient was admitted to hospital with the working diagnosis of odynophagia to rule out radiation-induced esophagitis rule out Candida esophagitis. Complicated by hypokalemia, hyponatremia and leukopenia.    Hospital Course:   1. Cardiovascular. Patient remained hemodynamically stable.  2.  Pulmonary. No signs of aspiration or volume overload.  3. Nephrology. Patient's renal function remained stable, her potassium improved to 4.0, actually remained 132.  4. Gastroenterology. she was diagnosed with radiation-induced esophagitis per endoscopy. Patient was discharged on sucralfate and lidocaine viscous. She should follow-up as outpatient for biopsy results.  5. Hematology. White count 1.0 she has received Neulasta  as an outpatient.  Discharge Condition: sTable  Follow UP  Follow-up Information    Follow up with Eulas Post, MD In 1 week.   Specialty:  Family Medicine   Contact information:   [place] [id number] [phone]       Follow up with Gastroeneterology office.   Contact information:   as scheduled       Consults obtained - Gastroenterology  Diet and Activity recommendation: See Discharge Instructions below  Discharge Instructions     Discharge Instructions    Diet - low sodium heart healthy    Complete by:  As directed      Discharge instructions    Complete by:  As directed   Follow with primary care in 7 days Please resume lovenox in am  Increase activity slowly    Complete by:  As directed              Discharge Medications       Medication List    STOP taking these medications        magic mouthwash w/lidocaine Soln      TAKE these medications        albuterol 108 (90 Base) MCG/ACT inhaler  Commonly known as:  PROAIR HFA  Inhale 2 puffs into the lungs every 4 (four) hours as needed for wheezing or shortness of breath.     ALPRAZolam 0.25 MG tablet  Commonly known as:  XANAX  Take 0.25 mg by mouth daily as needed for anxiety or sleep.     antiseptic oral rinse 0.05 % Liqd solution  Commonly known as:  CPC / CETYLPYRIDINIUM CHLORIDE 0.05%  7 mLs by Mouth Rinse route 2 times daily at 12 noon and 4 pm.     atorvastatin 10 MG tablet  Commonly known as:  LIPITOR  TAKE 1 TABLET BY MOUTH  EVERY DAY     budesonide-formoterol 160-4.5 MCG/ACT inhaler  Commonly known as:  SYMBICORT  Inhale 2 puffs into the lungs 2 (two) times daily.     enoxaparin 100 MG/ML injection  Commonly known as:  LOVENOX  Inject 0.9 mLs (90 mg total) into the skin daily.     MAALOX ADVANCED 200-200-20 MG/5ML suspension  Generic drug:  alum & mag hydroxide-simeth  Take 1 Dose by mouth daily as needed for indigestion.     ondansetron 8 MG disintegrating tablet  Commonly known as:  ZOFRAN-ODT  Take 8 mg by mouth daily as needed for nausea or vomiting.     oxyCODONE 5 MG/5ML solution  Commonly known as:  ROXICODONE  Take 15 mg by mouth 2 (two) times daily as needed for severe pain.     prochlorperazine 10 MG tablet  Commonly known as:  COMPAZINE  Take 10 mg by mouth daily as needed for nausea or vomiting.     sucralfate 1 GM/10ML suspension  Commonly known as:  CARAFATE  Take 1 g by mouth 4 (four) times daily.     Vitamin D (Ergocalciferol) 50000 units Caps capsule  Commonly known as:  DRISDOL  TAKE ONE CAPSULE BY MOUTH ONCE A WEEK ON THURSDAYS        Major procedures and Radiology Reports - PLEASE review detailed and final reports for all details, in brief -      Dg Chest 2 View  11/11/2015  CLINICAL DATA:  History of lung carcinoma with difficulty breathing and difficulty swallowing EXAM: CHEST  2 VIEW COMPARISON:  08/19/2015 FINDINGS: Cardiac shadow is within normal limits. The previously seen left lower lobe mass and left perihilar density have resolved in the interval. No focal infiltrate or sizable effusion is seen. No bony abnormality is noted. IMPRESSION: No active cardiopulmonary disease. Electronically Signed   By: Inez Catalina M.D.   On: 11/11/2015 14:27    Micro Results     No results found for this or any previous visit (from the past 240 hour(s)).     Today   Subjective    Jenny [name] patient is awake and alert. Patient denies any nausea or vomiting, her   odynophagia is marked improvement. She has tolerating by mouth diet adequately.  Objective   Blood pressure 134/77, pulse 102, temperature 98.7 F (37.1 C), temperature source Oral, resp. rate 18, height '5\' 5"'$  (1.651 m), weight  54.885 kg (121 lb), SpO2 96 %.   Intake/Output Summary (Last 24 hours) at 11/12/15 1756 Last data filed at 11/12/15 1724  Gross per 24 hour  Intake    800 ml  Output   1400 ml  Net   -600 ml    Exam Gen. Awake and alert Oral mucosa moist Lungs clear to auscultation bilaterally Heart S1-S2 present rhythmic Abdomen soft nontender Lower extremity is no edema Skin no rashes   Data Review   CBC w Diff: Lab Results  Component Value Date   WBC 1.0* 11/12/2015   HGB 8.7* 11/12/2015   HCT 26.1* 11/12/2015   PLT 178 11/12/2015   LYMPHOPCT 5 11/11/2015   MONOPCT 3 11/11/2015   EOSPCT 0 11/11/2015   BASOPCT 0 11/11/2015    CMP: Lab Results  Component Value Date   NA 132* 11/12/2015   K 4.0 11/12/2015   CL 103 11/12/2015   CO2 23 11/12/2015   BUN 10 11/12/2015   CREATININE 0.61 11/12/2015   PROT 6.0* 11/11/2015   ALBUMIN 3.3* 11/11/2015   BILITOT 1.1 11/11/2015   ALKPHOS 45 11/11/2015   AST 21 11/11/2015   ALT 23 11/11/2015  .   Total Time in preparing paper work, data evaluation and todays exam - 45 minutes  Tawni Millers M.D on 11/12/2015 at 5:56 PM  [facility]   Office  [phone]

## 2015-11-12 NOTE — Progress Notes (Signed)
    Progress Note   Subjective  Jenny [name] is a 65 year old Caucasian female who is admitted to the hospital on 11/11/2015 for a complaint of odynophagia.   This morning, the patient was found sitting up in bed. She tells me that using the viscous lidocaine last night she did have some relief, she has been nothing by mouth since midnight and is eagerly awaiting the procedure this afternoon. She has no new complaints or concerns, but reiterates that she is eager to go home after the procedure.   Objective   Vital signs in last 24 hours: Temp:  [98 F (36.7 C)-98.5 F (36.9 C)] 98.5 F (36.9 C) (07/11 0527) Pulse Rate:  [83-144] 88 (07/11 0527) Resp:  [11-20] 14 (07/11 0527) BP: (95-144)/(74-96) 105/93 mmHg (07/11 0527) SpO2:  [97 %-100 %] 97 % (07/11 0527) Weight:  [121 lb (54.885 kg)] 121 lb (54.885 kg) (07/10 1513) Last BM Date: 11/09/15 General: Caucasian female in NAD Heart:  Regular rate and rhythm; no murmurs Lungs: Respirations even and unlabored, lungs CTA bilaterally Abdomen:  Soft, nontender and nondistended. Normal bowel sounds. Extremities:  Without edema. Neurologic:  Alert and oriented,  grossly normal neurologically. Psych:  Cooperative. Normal mood and affect.  Intake/Output from previous day: 07/10 0701 - 07/11 0700 In: -  Out: 1100 [Urine:1100]  Lab Results:  Recent Labs  11/11/15 1446 11/12/15 0744  WBC 2.5* 1.0*  HGB 10.1* 8.7*  HCT 29.8* 26.1*  PLT 265 178   BMET  Recent Labs  11/11/15 1446 11/12/15 0744  NA 132* 132*  K 3.4* 4.0  CL 96* 103  CO2 25 23  GLUCOSE 104* 129*  BUN 19 10  CREATININE 0.75 0.61  CALCIUM 8.8* 8.2*   LFT  Recent Labs  11/11/15 1446  PROT 6.0*  ALBUMIN 3.3*  AST 21  ALT 23  ALKPHOS 45  BILITOT 1.1   PT/INR No results for input(s): LABPROT, INR in the last 72 hours.  Studies/Results: Dg Chest 2 View  11/11/2015  CLINICAL DATA:  History of lung carcinoma with difficulty breathing and difficulty  swallowing EXAM: CHEST  2 VIEW COMPARISON:  08/19/2015 FINDINGS: Cardiac shadow is within normal limits. The previously seen left lower lobe mass and left perihilar density have resolved in the interval. No focal infiltrate or sizable effusion is seen. No bony abnormality is noted. IMPRESSION: No active cardiopulmonary disease. Electronically Signed   By: Inez Catalina M.D.   On: 11/11/2015 14:27       Assessment / Plan:   Impression: 1. Odynophagia: Patient with history of radiation and chemotherapy recently for lung cancer, completed chemotherapy on Friday, 11/08/15, she has been treated twice recently for thrush with a mixture of Carafate, nystatin, fluconazole and Magic mouthwash, most recently a week ago-per patient, symptoms have never completely resolved; consider side effect from radiation versus thrush versus viral cause versus other 2. Anorexia: due to above  Plan: 1. EGD scheduled for this afternoon around 3:00 with Dr. Loletha Carrow. 2. Patient to remain nothing by mouth until after time of procedure 3. Please await further recommendations after time of procedure today.  Thank you for your kind consultation, we will continue to follow.  Active Problems:   Dysphagia   Esophagitis     LOS: 1 day   Lavone Nian [facility]  11/12/2015, 10:30 AM  Pager # [phone]

## 2015-11-13 ENCOUNTER — Telehealth: Payer: Self-pay | Admitting: *Deleted

## 2015-11-13 ENCOUNTER — Encounter (HOSPITAL_COMMUNITY): Payer: Self-pay | Admitting: Gastroenterology

## 2015-11-13 NOTE — Telephone Encounter (Signed)
Oncology Nurse Navigator Documentation  Oncology Nurse Navigator Flowsheets 11/13/2015  Navigator Encounter Type Telephone  Telephone Outgoing Call  Treatment Phase Pre-Tx/Tx Discussion  Barriers/Navigation Needs Coordination of Care  Interventions Coordination of Care  Coordination of Care Appts  Acuity Level 1  Time Spent with Patient 30   Patient called HIM team and left 7 messages to call her.  Lexine Baton spoke with him and asked if I could call. I called.  She would like to see Dr. Benay Spice sooner than July 24th.  I will update Dr. Benay Spice.

## 2015-11-14 ENCOUNTER — Telehealth: Payer: Self-pay | Admitting: Family Medicine

## 2015-11-14 MED ORDER — LIDOCAINE VISCOUS 2 % MT SOLN: 15.0000 mL | Freq: Four times a day (QID) | OROMUCOSAL | Status: DC | PRN

## 2015-11-14 NOTE — Telephone Encounter (Signed)
Pt would like a refill of lidocaine (XYLOCAINE) 2 % solution  Pt received rx upon discharge, and needs a refill asap. Pt is a cancer pt and cannot sip water without pain.   Is there someone that can call this rx in since Dr Elease Hashimoto is out Pt will be out of this med tonight and needs for throat.   [facility] [address]

## 2015-11-14 NOTE — Telephone Encounter (Signed)
Pt would like a refill of lidocaine (XYLOCAINE) 2 % solution  Pt received rx upon discharge, and needs a refill asap. Pt is a cancer pt and cannot sip water without pain.   Is there someon

## 2015-11-14 NOTE — Telephone Encounter (Signed)
Pt is aware that script has been sent into the pharmacy.

## 2015-11-14 NOTE — Telephone Encounter (Signed)
Script was sent e-scribe earlier today.

## 2015-11-15 ENCOUNTER — Telehealth: Payer: Self-pay | Admitting: *Deleted

## 2015-11-15 ENCOUNTER — Ambulatory Visit: Payer: Self-pay | Admitting: Adult Health

## 2015-11-15 NOTE — Telephone Encounter (Signed)
Oncology Nurse Navigator Documentation  Oncology Nurse Navigator Flowsheets 11/15/2015  Navigator Encounter Type Telephone  Telephone Outgoing Call  Treatment Phase Other  Barriers/Navigation Needs Coordination of Care  Interventions Coordination of Care  Coordination of Care Other  Acuity Level 3  Acuity Level 2 Other   Dr. Benay Spice is in need of more information on patient to help him assess urgency of appt.  I called patient to inquire.  Patient stated she received her last chemo on 11/06/15 and received 3 cycles.  Her last day of radiation was on 11/16/15.  Requested for more records including path report, treatment, and scans was sent to [facility] in [place].  Will update Dr. Benay Spice.

## 2015-11-18 ENCOUNTER — Telehealth: Payer: Self-pay | Admitting: Family Medicine

## 2015-11-18 MED ORDER — SUCRALFATE 1 GM/10ML PO SUSP: 1.0000 g | Freq: Four times a day (QID) | ORAL | Status: DC

## 2015-11-18 NOTE — Telephone Encounter (Signed)
Medication sent in for patient. 

## 2015-11-18 NOTE — Telephone Encounter (Signed)
Pt need new Rx for CARAFATE 1 GM    Ph[facility]:  [facility] [address]

## 2015-11-19 ENCOUNTER — Other Ambulatory Visit (INDEPENDENT_AMBULATORY_CARE_PROVIDER_SITE_OTHER): Payer: BLUE CROSS/BLUE SHIELD

## 2015-11-19 ENCOUNTER — Other Ambulatory Visit: Payer: BLUE CROSS/BLUE SHIELD

## 2015-11-19 DIAGNOSIS — C3492 Malignant neoplasm of unspecified part of left bronchus or lung: Secondary | ICD-10-CM | POA: Diagnosis not present

## 2015-11-19 LAB — CBC WITH DIFFERENTIAL/PLATELET
BASOS ABS: 0 10*3/uL (ref 0.0–0.1)
Basophils Relative: 0.2 % (ref 0.0–3.0)
EOS ABS: 0 10*3/uL (ref 0.0–0.7)
Eosinophils Relative: 0.1 % (ref 0.0–5.0)
HCT: 27.1 % — ABNORMAL LOW (ref 36.0–46.0)
Hemoglobin: 9 g/dL — ABNORMAL LOW (ref 12.0–15.0)
LYMPHS ABS: 0.5 10*3/uL — AB (ref 0.7–4.0)
Lymphocytes Relative: 4.2 % — ABNORMAL LOW (ref 12.0–46.0)
MCHC: 33.3 g/dL (ref 30.0–36.0)
MCV: 82.6 fl (ref 78.0–100.0)
Monocytes Absolute: 0.9 10*3/uL (ref 0.1–1.0)
Monocytes Relative: 7.7 % (ref 3.0–12.0)
NEUTROS ABS: 10.3 10*3/uL — AB (ref 1.4–7.7)
PLATELETS: 97 10*3/uL — AB (ref 150.0–400.0)
RBC: 3.28 Mil/uL — ABNORMAL LOW (ref 3.87–5.11)
RDW: 16 % — ABNORMAL HIGH (ref 11.5–15.5)
WBC: 11.8 10*3/uL — ABNORMAL HIGH (ref 4.0–10.5)

## 2015-11-20 LAB — PATHOLOGIST SMEAR REVIEW

## 2015-11-22 ENCOUNTER — Telehealth: Payer: Self-pay | Admitting: Family Medicine

## 2015-11-22 MED ORDER — LIDOCAINE VISCOUS 2 % MT SOLN: 15.0000 mL | Freq: Four times a day (QID) | OROMUCOSAL | Status: DC | PRN

## 2015-11-22 NOTE — Telephone Encounter (Signed)
Medication sent to pharmacy  

## 2015-11-22 NOTE — Telephone Encounter (Signed)
Pt needs a refill on lidocaine 2% solution. [facility] [address]

## 2015-11-25 ENCOUNTER — Encounter: Payer: Self-pay | Admitting: *Deleted

## 2015-11-25 ENCOUNTER — Other Ambulatory Visit: Payer: Self-pay | Admitting: *Deleted

## 2015-11-25 ENCOUNTER — Ambulatory Visit (HOSPITAL_BASED_OUTPATIENT_CLINIC_OR_DEPARTMENT_OTHER): Payer: BLUE CROSS/BLUE SHIELD | Admitting: Oncology

## 2015-11-25 VITALS — BP 96/68 | HR 124 | Temp 98.2°F | Resp 17 | Ht 65.0 in | Wt 112.6 lb

## 2015-11-25 DIAGNOSIS — R634 Abnormal weight loss: Secondary | ICD-10-CM | POA: Diagnosis not present

## 2015-11-25 DIAGNOSIS — C3492 Malignant neoplasm of unspecified part of left bronchus or lung: Secondary | ICD-10-CM | POA: Diagnosis not present

## 2015-11-25 DIAGNOSIS — K209 Esophagitis, unspecified: Secondary | ICD-10-CM | POA: Diagnosis not present

## 2015-11-25 DIAGNOSIS — R1310 Dysphagia, unspecified: Secondary | ICD-10-CM

## 2015-11-25 DIAGNOSIS — G893 Neoplasm related pain (acute) (chronic): Secondary | ICD-10-CM

## 2015-11-25 MED ORDER — HYDROCODONE-ACETAMINOPHEN 7.5-325 MG/15ML PO SOLN: 15.0000 mL | Freq: Four times a day (QID) | ORAL | 0 refills | Status: DC | PRN

## 2015-11-25 NOTE — Progress Notes (Signed)
[facility] New Patient Consult   Referring MD: Eulas Post, Md [place], [place] [address]   Jenny [name] 65 y.o.  04/21/1951    Reason for Referral: Small cell lung cancer   HPI: She presented emergency room on 08/19/2015 with chest pain, a cough, and hemoptysis. She has a remote history of a right leg DVT, 2008, and was maintained on Coumadin. A chest x-ray revealed a left perihilar mass. A CT of the chest confirmed a left hilar mass with mediastinal and subcarinal adenopathy. A possible left lower lobe intrapulmonary lymph node and a left lower lobe mass were seen. A staging PET scan 09/03/2015 confirmed a hypermetabolic 7.5 similar left perihilar mass, 2 hypermetabolic left lower lobe nodules, and hypermetabolic mediastinal lymphadenopathy. No abnormal activity in the liver, adrenal glands, or spleen. No skeletal metastases.  She was referred to pulmonary medicine on 08/28/2015 and was scheduled for bronchoscopy on 09/09/2015. She reports feeling anxious about the 2 week wait and self-referred [facility] in [place].  She underwent a bronchoscopy on 09/12/2015 . On endobronchial ultrasound a subcarinal node was identified in biopsy. A left lower mass was also identified and biopsied via endobronchial ultrasound. The cytology from a subcarinal lymph node and left lower lobe mass revealed small cell carcinoma. Immunostains on the subcarinal lymph nodes were positive for TTF-1, synaptophysin and cytokeratin AE1/AE3. The chromogranin stain was negative. The Ki-67 returned at greater than 50%. She reports being evaluated by both medical oncology and radiation oncology. She decided to proceed with treatment in [place]. A brain MRI on 09/13/2015 revealed no evidence of metastatic disease. She was treated with chest radiation and 3 cycles of etoposide/cisplatin chemotherapy. She completed 3 cycles of etoposide/cisplatin chemotherapy  (we do not have the chemotherapy flow sheet available today) per review of the available office notes it appears the chemotherapy was dose reduced and cisplatin was changed to carboplatin with cycle 3. She developed a neutropenic fever following cycle 2.  She completed chemotherapy and radiation in early July. She developed severe odynophagia and was admitted to [facility] on 11/11/2015. She was treated with intravenous hydration and analgesics. An upper endoscopy on 11/12/2015 confirmed demarcated circumferential esophagitis from 20-30 cm from the incisors. She was discharged on 11/12/2015. She continues to have odynophagia and reports partial improvement. She is able to tolerate cold liquids.   Past Medical History:  Diagnosis Date  . DVT (deep venous thrombosis) ([place]) Approximately 2009      . Hypercholesteremia   . Lung cancer (HCC)-Limited stage small cell lung cancer  April 2017     .  G5 P3, 2 miscarriages  Past Surgical History:  Procedure Laterality Date  . CESAREAN SECTION    . ESOPHAGOGASTRODUODENOSCOPY (EGD) WITH PROPOFOL N/A 11/12/2015   Procedure: ESOPHAGOGASTRODUODENOSCOPY (EGD) WITH PROPOFOL;  Surgeon: Doran Stabler, MD;  Location: [facility] ENDOSCOPY;  Service: Endoscopy;  Laterality: N/A;  With propofol if available    Medications: Reviewed  Allergies:  Allergies  Allergen Reactions  . Morphine And Related Itching and Rash    Family history: Her mother had head and neck cancer and non-Hodgkin's lymphoma. Her father had rectal cancer. A paternal uncle had lung cancer. Her paternal grandfather had lymphoma.  Social History:   She lives with her husband and son in [place]. She previously worked in a day care. She does not use all. She quit cigarettes on 08/26/2015. She reports receiving a red cell transfusion at [facility] after chemotherapy in 2017.  History  Alcohol Use No    History  Smoking Status  . Former Smoker  . Packs/day: 1.00  . Years: 39.00  .  Types: Cigarettes  . Quit date: 08/20/2015  Smokeless Tobacco  . Not on file      ROS:   Positives include:Intermittent nausea and vomiting-last 2 weeks ago, odynophagia, generalized weakness  A complete ROS was otherwise negative.  Physical Exam:  Blood pressure 96/68, pulse (!) 124, temperature 98.2 F (36.8 C), temperature source Oral, resp. rate 17, height '5\' 5"'  (1.651 m), weight 112 lb 9.6 oz (51.1 kg), SpO2 98 %.  HEENT: Oropharynx without thrush or ulcers, neck without mass Lungs: Clear bilaterally Cardiac: Regular rate and rhythm Abdomen: No hepatomegaly, no mass, nontender  Vascular: No leg edema Lymph nodes: No cervical, supraclavicular, axillary, or inguinal nodes Neurologic: Alert and oriented, the motor exam appears intact in the upper and lower extremities Skin: No rash Musculoskeletal: No spine tenderness   LAB:  CBC  Lab Results  Component Value Date   WBC 11.8 (H) 11/19/2015   HGB 9.0 (L) 11/19/2015   HCT 27.1 (L) 11/19/2015   MCV 82.6 11/19/2015   PLT 97.0 (L) 11/19/2015   NEUTROABS 10.3 (H) 11/19/2015     CMP      Component Value Date/Time   NA 132 (L) 11/12/2015 0744   K 4.0 11/12/2015 0744   CL 103 11/12/2015 0744   CO2 23 11/12/2015 0744   GLUCOSE 129 (H) 11/12/2015 0744   BUN 10 11/12/2015 0744   CREATININE 0.61 11/12/2015 0744   CALCIUM 8.2 (L) 11/12/2015 0744   PROT 6.0 (L) 11/11/2015 1446   ALBUMIN 3.3 (L) 11/11/2015 1446   AST 21 11/11/2015 1446   ALT 23 11/11/2015 1446   ALKPHOS 45 11/11/2015 1446   BILITOT 1.1 11/11/2015 1446   GFRNONAA >60 11/12/2015 0744   GFRAA >60 11/12/2015 0744      Imaging:  CT chest 08/26/2015 and PET 09/03/2015-images reviewed with Jenny [name] and her family   Assessment/Plan:   1. Limited stage small cell lung cancer, diagnosed on biopsy of a subcarinal lymph node and left lung mass by EBUS on 09/12/2015  PET scan 09/03/2015 confirmed a hypermetabolic left perihilar mass, 2 left lower  lobe pulmonary masses, and mediastinal lymphadenopathy, no evidence of distant metastatic disease  Brain MRI negative for metastatic disease 09/13/2015  Status post chest radiation and 3 cycles of systemic chemotherapy given at [facility] in [place], completed July 2017  2.   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    Weight loss secondary to #2  4.    History of febrile neutropenia following chemotherapy  5.    Remote history of a right leg DVT-2008?-Maintained on Lovenox  6.    History of tobacco use  7.   Admission to [facility] 10/27/2015 with neutropenic fever, esophagitis, and dehydration   Disposition:   Jenny [name] was diagnosed with limited stage small cell lung cancer in April 2017. She was treated with chest radiation and 3 cycles of chemotherapy at the [facility] in [place].  The treatment was complicated by esophagitis, febrile neutropenia, and weight loss. She continues to have odynophagia secondary to esophagitis. She will continue anti acid therapy. I gave her a prescription for hydrocodone elixir to use as needed for pain. She will be scheduled to meet with the Cancer center nutritionist later  this week. We encouraged her to increase the use of nutrition supplements.  Jenny [name] is scheduled to undergo a restaging evaluation. She understands that she has received standard treatment for limited stage small cell lung cancer. The treatment can be given here. I recommend a restaging evaluation and then a decision on further chemotherapy based on the restaging findings.  She wishes to have further discussions with her family regarding the option of continuing treatment in [place] versus here . She will return for an office visit next week. She will contact us if she decides to have treatment here and we will arrange for a restaging CT of the chest.  Approximately 50 minutes  were spent with the patient today. The majority of the time was used for counseling and coordination of care.  Betsy Coder, MD  11/25/2015, 5:39 PM

## 2015-11-25 NOTE — Progress Notes (Signed)
Oncology Nurse Navigator Documentation  Oncology Nurse Navigator Flowsheets 11/25/2015  Navigator Encounter Type Clinic/MDC  Patient Visit Type MedOnc  Treatment Phase Other  Barriers/Navigation Needs Coordination of Care  Interventions Coordination of Care  Coordination of Care Appts  Acuity Level 2  Acuity Level 2 Assistance expediting appointments  Time Spent with Patient 30   I spoke with Jenny [name] today in clinic.  Dr. Benay Spice requested I make an appt for her to see [facility] dietitian.  I called Barb and was given an appt.  I updated the patient and her family.

## 2015-11-26 ENCOUNTER — Other Ambulatory Visit: Payer: Self-pay | Admitting: *Deleted

## 2015-11-26 ENCOUNTER — Other Ambulatory Visit: Payer: Self-pay | Admitting: Emergency Medicine

## 2015-11-26 ENCOUNTER — Other Ambulatory Visit: Payer: Self-pay

## 2015-11-26 MED ORDER — VITAMIN D (ERGOCALCIFEROL) 1.25 MG (50000 UNIT) PO CAPS: ORAL_CAPSULE | ORAL | 0 refills | Status: DC

## 2015-11-26 NOTE — Progress Notes (Signed)
Pt called this AM asking if she can be seen this Friday and not on 12/04/15 d/t her husband will be out of town that day.  Per Dr. Benay Spice, pt to see L. Mealey NP on Friday at 1:45PM.  Pt notified and appreciative of schedule change and call.  Pt has no further questions or concerns at this time.

## 2015-11-27 ENCOUNTER — Telehealth: Payer: Self-pay | Admitting: Family Medicine

## 2015-11-27 DIAGNOSIS — C3490 Malignant neoplasm of unspecified part of unspecified bronchus or lung: Secondary | ICD-10-CM

## 2015-11-27 DIAGNOSIS — R635 Abnormal weight gain: Secondary | ICD-10-CM

## 2015-11-27 NOTE — Telephone Encounter (Signed)
Order entered for PT. I have placed order in your folder to initial.

## 2015-11-27 NOTE — Telephone Encounter (Signed)
Okay to go ahead and order PT or should we wait for order?

## 2015-11-27 NOTE — Telephone Encounter (Signed)
Pt states her oncologist has sent her a copy of an order PT. Pt will have them fax the order to [place]. Pt states the PT will be for weight gain, reduce fatigue, improve physical function, general all health well being. Pt unsure where to go and hopes Dr Elease Hashimoto will order the PT Awaiting fax.

## 2015-11-28 ENCOUNTER — Ambulatory Visit: Payer: BLUE CROSS/BLUE SHIELD | Admitting: Nutrition

## 2015-11-28 NOTE — Progress Notes (Signed)
65 year old female diagnosed with small cell lung cancer status post chemoradiation therapy at [facility].  Past medical history includes DVT, tobacco, and hypercholesterolemia.  Medications include Xanax, Lipitor, Maalox, Zofran, Compazine, and vitamin D.  Labs include sodium 132, glucose 129 on July 11.  Height: 5 feet 5 inches. Weight: 112.6 pounds July 24. Usual body weight: 130 pounds per patient.  Patient weighed 138 pounds March 2017. BMI: 18.74.  Estimated nutrition needs: 1800-2100 calories, 80-100 grams protein, 2 L fluid.  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   She denies other nutrition impact symptoms. Reports Carafate does not help. New pain medication has helped and she has about 5 minutes to eat a meal without pain. Patient is tolerating mashed potatoes and gravy, broth-based soups, ice cream, and whipped yogurt. Patient's son is interested in information on selenium and milk thistle for his mother.  Patient verbalizes she is not interested in taking these at this time.  Nutrition diagnosis:  Severe malnutrition related to 18% weight loss over 6 months and severe depletion of muscle mass and fat stores.  Intervention:  Educated patient on strategies for consuming increased calories and protein.   Encouraged small frequent meals and snacks. Reviewed ways to pure a variety of foods that will not irritate her esophagus. Encouraged patient prepare a variety of different milkshakes using oral nutrition supplements of her choice. Provided samples, recipes and fact sheets. Questions were answered and teach back method was used.  Monitoring, evaluation, goals:  Patient will tolerate adequate calories and protein to promote healing and weight gain.  Next visit: Patient will contact me by phone as needed.  **Disclaimer: This note was dictated with voice recognition software. Similar sounding words can inadvertently be  transcribed and this note may contain transcription errors which may not have been corrected upon publication of note.**

## 2015-11-29 ENCOUNTER — Ambulatory Visit (HOSPITAL_BASED_OUTPATIENT_CLINIC_OR_DEPARTMENT_OTHER): Payer: BLUE CROSS/BLUE SHIELD | Admitting: Nurse Practitioner

## 2015-11-29 VITALS — BP 93/61 | HR 108 | Temp 98.0°F | Resp 17 | Ht 65.0 in | Wt 114.5 lb

## 2015-11-29 DIAGNOSIS — K209 Esophagitis, unspecified: Secondary | ICD-10-CM | POA: Diagnosis not present

## 2015-11-29 DIAGNOSIS — R1310 Dysphagia, unspecified: Secondary | ICD-10-CM

## 2015-11-29 DIAGNOSIS — C3492 Malignant neoplasm of unspecified part of left bronchus or lung: Secondary | ICD-10-CM | POA: Diagnosis not present

## 2015-11-29 DIAGNOSIS — G893 Neoplasm related pain (acute) (chronic): Secondary | ICD-10-CM | POA: Diagnosis not present

## 2015-11-29 DIAGNOSIS — R634 Abnormal weight loss: Secondary | ICD-10-CM

## 2015-11-29 DIAGNOSIS — Z86718 Personal history of other venous thrombosis and embolism: Secondary | ICD-10-CM

## 2015-11-29 NOTE — Progress Notes (Addendum)
  Jenny [name]   Diagnosis:  Small cell lung cancer  INTERVAL HISTORY:   Jenny [name] returns as scheduled. She continues to have odynophagia. Hydrocodone elixir is partially effective. Oral intake is poor. She tolerates warm liquids best. She feels hungry. She has occasional mild nausea. No vomiting. No fever, cough or shortness of breath.  Objective:  Vital signs in last 24 hours:  Blood pressure 93/61, pulse (!) 108, temperature 98 F (36.7 C), temperature source Oral, resp. rate 17, height '5\' 5"'$  (1.651 m), weight 114 lb 8 oz (51.9 kg), SpO2 98 %.    HEENT: No thrush or ulcers. Resp: Lungs clear bilaterally. Cardio: Regular, mildly tachycardic. GI: No organomegaly. Vascular: No leg edema. Calves soft and nontender.   Lab Results:  Lab Results  Component Value Date   WBC 11.8 (H) 11/19/2015   HGB 9.0 (L) 11/19/2015   HCT 27.1 (L) 11/19/2015   MCV 82.6 11/19/2015   PLT 97.0 (L) 11/19/2015   NEUTROABS 10.3 (H) 11/19/2015    Imaging:  No results found.  Medications: I have reviewed the patient's current medications.  Assessment/Plan: 1. Limited stage small cell lung cancer, diagnosed on biopsy of a subcarinal lymph node and left lung mass by EBUS on 09/12/2015 ? PET scan 09/03/2015 confirmed a hypermetabolic left perihilar mass, 2 left lower lobe pulmonary masses, and mediastinal lymphadenopathy, no evidence of distant metastatic disease ? Brain MRI negative for metastatic disease 09/13/2015 ? Status post chest radiation 09/26/2015 through 10/18/2015  ? Cycle 1 cisplatin/etoposide beginning 09/26/2015 ? Cycle 2 cisplatin/etoposide beginning 10/16/2015 ? Cycle 3 carboplatin/etoposide beginning 11/06/2015  2.   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    Weight loss secondary to #2  4.    History of febrile neutropenia following chemotherapy  5.    Remote history of a right  leg DVT-2008?-Maintained on Lovenox  6.    History of tobacco use  7.   Admission to [facility] 10/27/2015 with neutropenic fever, esophagitis, and dehydration   Disposition: Jenny [name] appears stable. 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 She will continue hydrocodone elixir as needed. Hopefully symptoms will improve significantly over the next few weeks. She will continue to work on activity, nutrition/hydration.  Dr. Benay Spice recommends a restaging CT scan of the chest at this point. If there is evidence of a complete response the plan is to proceed with additional chemotherapy with carboplatin/etoposide.  She is undecided as to whether she wants to continue oncology care in [place] or transfer care to [place]. She will make a decision next week and let us know. She did not wish to schedule formal follow-up in our office.   Patient seen with Dr. Benay Spice.    Jenny [name], Jenny [name] ANP/GNP-BC   11/29/2015  2:37 PM  This was a shared visit with Ned Card. We discussed our recommendation for restaging and further systemic therapy with Jenny [name] and her husband. We recommend a referral to consider prophylactic cranial radiation if there is no evidence of disease progression.  We encouraged her to increase her diet and limit the use of hydrocodone as tolerated.  Jenny [name] will contact us with her decision regarding treatment here versus in [place] by next week.  Julieanne Manson, M.D.

## 2015-12-04 ENCOUNTER — Ambulatory Visit: Payer: Self-pay | Admitting: Oncology

## 2015-12-09 ENCOUNTER — Ambulatory Visit: Payer: BLUE CROSS/BLUE SHIELD | Attending: Family Medicine | Admitting: Physical Therapy

## 2015-12-09 DIAGNOSIS — R5381 Other malaise: Secondary | ICD-10-CM | POA: Diagnosis present

## 2015-12-09 DIAGNOSIS — M6281 Muscle weakness (generalized): Secondary | ICD-10-CM | POA: Diagnosis present

## 2015-12-09 NOTE — Patient Instructions (Addendum)
ABDUCTION: Standing - Resistance Band (Active)   Stand, feet flat. Against yellow resistance band, lift right leg out to side. Complete ___ sets of ___ repetitions. Perform ___ sessions per day.    Strengthening: Hip Flexion - Resisted   With tubing around left ankle, anchor behind, bring leg forward, keeping knee straight. Repeat ____ times per set. Do ____ sets per session. Do ____ sessions per day.  Strengthening: Hip Extension - Resisted   With tubing around right ankle, face anchor and pull leg straight back. Repeat ____ times per set. Do ____ sets per session. Do ____ sessions per day.  Functional Quadriceps: Sit to Stand    Sit on edge of chair, feet flat on floor. Stand upright, extending knees fully. Repeat ____ times per set. Do ____ sets per session. Do ____ sessions per day.  http://orth.exer.us/734   Copyright  VHI. All rights reserved.    Walking program with goal of 20 minutes per day in 5 minute intervals

## 2015-12-09 NOTE — Therapy (Signed)
[facility] [address], [place], [place], [address] Phone: [phone]   Fax:  [phone]  Physical Therapy Evaluation  Patient Details  Name: Jenny [name] MRN: [id number] Date of Birth: 1950/05/27 Referring Provider: Carolann Littler  Encounter Date: 12/09/2015      PT End of Session - 12/09/15 1219    Visit Number 1   Date for PT Re-Evaluation 01/20/16   PT Start Time 1145   PT Stop Time 1220   PT Time Calculation (min) 35 min   Activity Tolerance Patient tolerated treatment well   Behavior During Therapy [facility] for tasks assessed/performed      Past Medical History:  Diagnosis Date  . DVT (deep venous thrombosis) ([place])    1 year ago  . Hypercholesteremia   . Lung cancer [facility])     Past Surgical History:  Procedure Laterality Date  . CESAREAN SECTION    . ESOPHAGOGASTRODUODENOSCOPY (EGD) WITH PROPOFOL N/A 11/12/2015   Procedure: ESOPHAGOGASTRODUODENOSCOPY (EGD) WITH PROPOFOL;  Surgeon: Doran Stabler, MD;  Location: [facility];  Service: Endoscopy;  Laterality: N/A;  With propofol if available    There were no vitals filed for this visit.       Subjective Assessment - 12/09/15 1148    Subjective "I just feel weak and out of shape. I have lost a lot of weight and a lot of muscle and I just can't do as much as I want to." Pt going through cancer treatments and hopes to gain muscle and increase endurance   Pertinent History small cell lung cancer   How long can you walk comfortably? up the stairs 2 x a day, 45 minutes at the grocery store   Currently in Pain? No/denies            [facility] PT Assessment - 12/09/15 0001      Assessment   Medical Diagnosis small cell lung cancer   Referring Provider burchette, bruce     Precautions   Precaution Comments pt with active cancer     Restrictions   Weight Bearing Restrictions --  NO ESTIM     Balance Screen   Has the patient fallen in the past  6 months No     Prior Function   Level of Independence Independent     Cognition   Overall Cognitive Status Within Functional Limits for tasks assessed     Observation/Other Assessments   Observations HR 100 bpm at rest     Sensation   Light Touch Appears Intact     ROM / Strength   AROM / PROM / Strength Strength     Strength   Overall Strength Comments Bilat LEs grossly 4-/5 hip and knee     6 Minute Walk- Baseline   6 Minute Walk- Baseline yes   HR (bpm) 115     6 minute walk test results    Aerobic Endurance Distance Walked 640  640 feet     Standardized Balance Assessment   Standardized Balance Assessment Five Times Sit to Stand   Five times sit to stand comments  24.8 seconds, HR 125bpm                           PT Education - 12/09/15 1211    Education provided Yes   Education Details HEP, PT POC   Person(s) Educated Patient   Methods Explanation;Demonstration;Handout   Comprehension Returned demonstration;Verbalized understanding  PT Short Term Goals - 12/09/15 1223      PT SHORT TERM GOAL #1   Title Pt will be independent with initial HEP   Time 3   Period Weeks   Status New     PT SHORT TERM GOAL #2   Title Pt will improve bilat LE strength to 4/5 to improve ability to perform daily tasks with decreased fatigue   Time 3   Period Weeks   Status New           PT Long Term Goals - 12/09/15 1223      PT LONG TERM GOAL #1   Title Pt will be independent with advanced HEP   Time 6   Period Weeks   Status New     PT LONG TERM GOAL #2   Title Pt will decrease resting HR to <85bpm to demo improved cardiovascular health   Time 6   Period Weeks   Status New     PT LONG TERM GOAL #3   Title Pt will improve 6 minute walk test to >900' to demo improved gait speed and endurance   Time 6   Period Weeks   Status New     PT LONG TERM GOAL #4   Title Pt will demo 4+/5 bilat LE strength to perform daily activities  without increased fatigue   Time 6   Period Weeks   Status New     PT LONG TERM GOAL #5   Title Pt iwll improve 5 time sit to stand to <18 seconds   Time 6   Period Weeks   Status New               Plan - 12/09/15 1220    Clinical Impression Statement Pt presents with deconditioning due to lung cancer and esophogitis. Pt will benefit from skilled PT to increase strength and activity tolerance.   Rehab Potential Good   PT Frequency 2x / week   PT Duration 6 weeks   PT Treatment/Interventions Patient/family education;Gait training;Stair training;Functional mobility training;Therapeutic exercise;Therapeutic activities;Energy conservation;Manual techniques;Cryotherapy   PT Next Visit Plan add weights to LE exercises, add UE strengthening, start nustep or bike for cardiovascular endurance   PT Home Exercise Plan LE strengthening and walking   Consulted and Agree with Plan of Care Patient      Patient will benefit from skilled therapeutic intervention in order to improve the following deficits and impairments:  Cardiopulmonary status limiting activity, Decreased activity tolerance, Decreased strength  Visit Diagnosis: Physical deconditioning - Plan: PT plan of care cert/re-cert  Muscle weakness (generalized) - Plan: PT plan of care cert/re-cert     Problem List Patient Active Problem List   Diagnosis Date Noted  . Malnutrition of moderate degree 11/12/2015  . Dysphagia 11/11/2015  . Esophagitis 11/11/2015  . Odynophagia   . Anorexia   . Small cell lung cancer ([place]) 09/18/2015  . CAP (community acquired pneumonia) 08/28/2015  . Hemoptysis 08/28/2015  . COPD (chronic obstructive pulmonary disease) ([place]) 08/28/2015  . Lung mass 08/26/2015  . Other acute sinusitis 10/27/2013  . Anticoagulant long-term use 10/27/2013  . Cough over 2 weeks 10/27/2013  . Encounter for therapeutic drug monitoring 10/05/2013  . Hyperlipidemia 11/04/2011  . DVT, lower extremity, recurrent  ([place]) 11/04/2011  . Nicotine use disorder 11/04/2011    Isabelle Course, PT, DPT 12/09/2015, 12:28 PM  [facility] [address], [place], [place], [address] Phone: [phone]  Fax:  [phone]  Name: Jenny [name] MRN: [id number] Date of Birth: November 16, 1950

## 2015-12-10 ENCOUNTER — Telehealth: Payer: Self-pay | Admitting: *Deleted

## 2015-12-10 NOTE — Telephone Encounter (Signed)
Oncology Nurse Navigator Documentation  Oncology Nurse Navigator Flowsheets 12/10/2015  Navigator Encounter Type Telephone  Telephone Outgoing Call/I called Ms. Peffley to see what her plans are regarding treatment.  I was unable to reach nor leave a vm message.   Barriers/Navigation Needs Coordination of Care  Interventions Coordination of Care  Coordination of Care Appts  Acuity Level 1

## 2015-12-10 NOTE — Telephone Encounter (Signed)
Oncology Nurse Navigator Documentation  Oncology Nurse Navigator Flowsheets 12/10/2015  Navigator Encounter Type Telephone  Telephone Outgoing Call/I updated Dr. Benay Spice that patient would like to get her treatment here at [facility].  He asked me to schedule her with Lattie Haw on 8/31.  I called patient and scheduled patient.    Barriers/Navigation Needs Coordination of Care  Interventions Coordination of Care  Coordination of Care Appts  Acuity Level 1  Acuity Level 1 Minimal follow up required  Acuity Level 2 -  Time Spent with Patient 30

## 2015-12-12 ENCOUNTER — Telehealth: Payer: Self-pay | Admitting: *Deleted

## 2015-12-12 NOTE — Telephone Encounter (Signed)
Call received from patient questioning if she needs to be seen for a new cough.  Pt states that it feels as though she has a "tickle in her throat".  She states that she is swallowing without difficulty, eating and drinking without difficulty, denies any fever, pain, nausea, SOB and is not expectorating any phlegm.  Pt instructed that appt on 01/02/16 will be adequate with these current symptoms.  Pt instructed to call office if any further concerns or questions arise.

## 2015-12-16 ENCOUNTER — Encounter: Payer: Self-pay | Admitting: Physical Therapy

## 2015-12-20 ENCOUNTER — Telehealth: Payer: Self-pay | Admitting: Family Medicine

## 2015-12-20 NOTE — Telephone Encounter (Signed)
Pt stated that she went to the [facility] in [place] and they did a scan and found a white spot on one of her bones and they suggest that she have a full nuclear bone body scan.  Pt was not able to get the scan in [place] due to their machine being down.  Her insurance has approved it.  Pt would like to have it done today if you would and could put the order in for her to have it done.

## 2015-12-22 ENCOUNTER — Telehealth: Payer: Self-pay | Admitting: Family Medicine

## 2015-12-22 DIAGNOSIS — C3490 Malignant neoplasm of unspecified part of unspecified bronchus or lung: Secondary | ICD-10-CM

## 2015-12-22 NOTE — Telephone Encounter (Signed)
Spoke with patient.  CT scans apparently showed L1, L2 and left ?Ileum pelvic abnormalities and they recommend whole body nuclear bone scan.  Their scanner was done in [place] and she wishes to get here.  I will set up .

## 2015-12-22 NOTE — Telephone Encounter (Signed)
Spoke with pt.  Treated in [place] for small cell cancer of lung and CT scans at [facility] ?L1,L2, and left ileum lesions.  Recommendations for whole bone NM bone scan.

## 2015-12-23 ENCOUNTER — Ambulatory Visit: Payer: BLUE CROSS/BLUE SHIELD | Admitting: Physical Therapy

## 2015-12-23 ENCOUNTER — Encounter: Payer: Self-pay | Admitting: Physical Therapy

## 2015-12-23 DIAGNOSIS — R5381 Other malaise: Secondary | ICD-10-CM | POA: Diagnosis not present

## 2015-12-23 DIAGNOSIS — M6281 Muscle weakness (generalized): Secondary | ICD-10-CM

## 2015-12-23 NOTE — Therapy (Signed)
[facility] [address], [place], [place], [address] Phone: [phone]   Fax:  [phone]  Physical Therapy Treatment  Patient Details  Name: Jenny [name] MRN: [id number] Date of Birth: 12/28/1950 Referring Provider: Carolann Littler  Encounter Date: 12/23/2015      PT End of Session - 12/23/15 1046    Visit Number 2   Date for PT Re-Evaluation 01/20/16   PT Start Time 2778   PT Stop Time 1050   PT Time Calculation (min) 35 min   Activity Tolerance Patient tolerated treatment well   Behavior During Therapy [facility] for tasks assessed/performed      Past Medical History:  Diagnosis Date  . DVT (deep venous thrombosis) ([place])    1 year ago  . Hypercholesteremia   . Lung cancer [facility])     Past Surgical History:  Procedure Laterality Date  . CESAREAN SECTION    . ESOPHAGOGASTRODUODENOSCOPY (EGD) WITH PROPOFOL N/A 11/12/2015   Procedure: ESOPHAGOGASTRODUODENOSCOPY (EGD) WITH PROPOFOL;  Surgeon: Doran Stabler, MD;  Location: [facility];  Service: Endoscopy;  Laterality: N/A;  With propofol if available    There were no vitals filed for this visit.      Subjective Assessment - 12/23/15 1018    Subjective I got my scans to follow up from my cancer and I am doing a lot better!  I feel fine, no pain   Pertinent History small cell lung cancer   How long can you walk comfortably? up the stairs 2 x a day, 45 minutes at the grocery store   Currently in Pain? No/denies                         [facility] PT Treatment/Exercise - 12/23/15 0001      Exercises   Exercises Shoulder;Knee/Hip     Knee/Hip Exercises: Aerobic   Stationary Bike 8 minutes level 1     Knee/Hip Exercises: Standing   Heel Raises Both;10 reps   Knee Flexion Strengthening;Both;10 reps   Hip Abduction Stengthening;Both;10 reps     Knee/Hip Exercises: Seated   Long Arc Quad Strengthening;Both;10 reps  5 second hold      Shoulder Exercises: Seated   Horizontal ABduction Strengthening;Both;10 reps   Theraband Level (Shoulder Horizontal ABduction) Level 2 (Red)   Other Seated Exercises diagonals with red t-band x 10 both     Shoulder Exercises: ROM/Strengthening   UBE (Upper Arm Bike) 2 min fwd/2 min bkwd level 0                PT Education - 12/23/15 1041    Education provided Yes   Education Details HEP for UE exercises   Person(s) Educated Patient   Methods Explanation;Demonstration;Handout   Comprehension Verbalized understanding;Returned demonstration          PT Short Term Goals - 12/23/15 1034      PT SHORT TERM GOAL #1   Title Pt will be independent with initial HEP   Status On-going     PT SHORT TERM GOAL #2   Title Pt will improve bilat LE strength to 4/5 to improve ability to perform daily tasks with decreased fatigue   Status On-going           PT Long Term Goals - 12/23/15 1034      PT LONG TERM GOAL #1   Title Pt will be independent with advanced HEP   Status On-going  PT LONG TERM GOAL #2   Title Pt will decrease resting HR to <85bpm to demo improved cardiovascular health   Status On-going     PT LONG TERM GOAL #3   Title Pt will improve 6 minute walk test to >900' to demo improved gait speed and endurance   Status On-going     PT LONG TERM GOAL #4   Title Pt will demo 4+/5 bilat LE strength to perform daily activities without increased fatigue   Status On-going     PT LONG TERM GOAL #5   Title Pt iwll improve 5 time sit to stand to <18 seconds   Status On-going               Plan - 12/23/15 1047    Clinical Impression Statement Pt fatigues quickly with aerobic and strengthening exercises but is excited to push through and work hard. Pt will continue to benefit from PT to improve strength and activity tolerance   Rehab Potential Good   PT Frequency 2x / week   PT Duration 6 weeks   PT Treatment/Interventions Patient/family  education;Gait training;Stair training;Functional mobility training;Therapeutic exercise;Therapeutic activities;Energy conservation;Manual techniques;Cryotherapy   PT Next Visit Plan add weights or reps to LE exercises, progress UE strengthening, continue cardiovascular endurance   Consulted and Agree with Plan of Care Patient      Patient will benefit from skilled therapeutic intervention in order to improve the following deficits and impairments:  Cardiopulmonary status limiting activity, Decreased activity tolerance, Decreased strength  Visit Diagnosis: Physical deconditioning  Muscle weakness (generalized)     Problem List Patient Active Problem List   Diagnosis Date Noted  . Malnutrition of moderate degree 11/12/2015  . Dysphagia 11/11/2015  . Esophagitis 11/11/2015  . Odynophagia   . Anorexia   . Small cell lung cancer ([place]) 09/18/2015  . CAP (community acquired pneumonia) 08/28/2015  . Hemoptysis 08/28/2015  . COPD (chronic obstructive pulmonary disease) ([place]) 08/28/2015  . Lung mass 08/26/2015  . Other acute sinusitis 10/27/2013  . Anticoagulant long-term use 10/27/2013  . Cough over 2 weeks 10/27/2013  . Encounter for therapeutic drug monitoring 10/05/2013  . Hyperlipidemia 11/04/2011  . DVT, lower extremity, recurrent ([place]) 11/04/2011  . Nicotine use disorder 11/04/2011    Isabelle Course, PT, DPT 12/23/2015, 10:49 AM  [facility] [address], [place], [place], [address] Phone: [phone]   Fax:  [phone]  Name: Jenny [name] MRN: [id number] Date of Birth: Jul 11, 1950

## 2015-12-23 NOTE — Patient Instructions (Addendum)
PNF Strengthening: Resisted    Standing with resistive band around each hand, bring right arm up and away, thumb back. Repeat ____ times per set. Do ____ sets per session. Do ____ sessions per day.  http://orth.exer.us/918   Copyright  VHI. All rights reserved.  Resisted Horizontal Abduction: Bilateral    Sit or stand, tubing in both hands, arms out in front. Keeping arms straight, pinch shoulder blades together and stretch arms out. Repeat ____ times per set. Do ____ sets per session. Do ____ sessions per day.  http://orth.exer.us/968   Copyright  VHI. All rights reserved.

## 2015-12-23 NOTE — Telephone Encounter (Signed)
I see order in chart.

## 2015-12-25 ENCOUNTER — Telehealth: Payer: Self-pay | Admitting: *Deleted

## 2015-12-25 DIAGNOSIS — C3490 Malignant neoplasm of unspecified part of unspecified bronchus or lung: Secondary | ICD-10-CM

## 2015-12-25 NOTE — Telephone Encounter (Signed)
Message from pt requesting to discuss CT and MRI result. Per pt, there was "about an 85% reduction but is shows something on my spine." She has scans on a disk. Pt is requesting RadOnc referral for cranial radiation as well.  She is interested in a sooner appt to discuss things. Will review with provider for appt.

## 2015-12-26 NOTE — Telephone Encounter (Signed)
Returned call to pt, instructed her to bring scans to office to be uploaded into our system. Keep 8/31 appt as scheduled, per Dr. Benay Spice. Order received for referral to radiation oncology.

## 2015-12-26 NOTE — Telephone Encounter (Addendum)
Pt dropped off disk and report of CT and MRI brain from [facility] done on 8/15. Disks taken to radiology to be uploaded into system. Copy of MRI report on Dr. Gearldine Shown desk for review. Pt reports she has access to CT report, she will bring to office visit.

## 2015-12-27 ENCOUNTER — Encounter (HOSPITAL_COMMUNITY)
Admission: RE | Admit: 2015-12-27 | Discharge: 2015-12-27 | Disposition: A | Discharge status: Home / Self Care | Payer: BLUE CROSS/BLUE SHIELD | Source: Ambulatory Visit | Attending: Family Medicine | Admitting: Family Medicine

## 2015-12-27 ENCOUNTER — Ambulatory Visit
Admission: RE | Admit: 2015-12-27 | Discharge: 2015-12-27 | Disposition: A | Discharge status: Home / Self Care | Payer: Self-pay | Source: Ambulatory Visit | Attending: Hematology | Admitting: Hematology

## 2015-12-27 ENCOUNTER — Other Ambulatory Visit: Payer: Self-pay | Admitting: Hematology

## 2015-12-27 ENCOUNTER — Other Ambulatory Visit: Payer: Self-pay | Admitting: Family Medicine

## 2015-12-27 ENCOUNTER — Encounter: Payer: Self-pay | Admitting: Physical Therapy

## 2015-12-27 DIAGNOSIS — C3490 Malignant neoplasm of unspecified part of unspecified bronchus or lung: Secondary | ICD-10-CM | POA: Insufficient documentation

## 2015-12-27 MED ORDER — TECHNETIUM TC 99M MEDRONATE IV KIT
20.1000 | PACK | Freq: Once | INTRAVENOUS | Status: AC | PRN
  Administered 2015-12-27: 20.1 via INTRAVENOUS

## 2015-12-30 ENCOUNTER — Telehealth: Payer: Self-pay | Admitting: Family Medicine

## 2015-12-30 ENCOUNTER — Encounter: Payer: Self-pay | Admitting: Physical Therapy

## 2015-12-30 NOTE — Telephone Encounter (Signed)
Please fax blood work to pt oncologist  Dr Denice Paradise [phone]

## 2015-12-30 NOTE — Telephone Encounter (Signed)
Results faxed to Oncologist.

## 2015-12-31 ENCOUNTER — Other Ambulatory Visit: Payer: Self-pay | Admitting: Family Medicine

## 2016-01-02 ENCOUNTER — Telehealth: Payer: Self-pay | Admitting: Oncology

## 2016-01-02 ENCOUNTER — Ambulatory Visit (HOSPITAL_BASED_OUTPATIENT_CLINIC_OR_DEPARTMENT_OTHER): Payer: BLUE CROSS/BLUE SHIELD | Admitting: Nurse Practitioner

## 2016-01-02 VITALS — BP 101/80 | HR 99 | Temp 98.4°F | Resp 18 | Ht 65.0 in | Wt 122.0 lb

## 2016-01-02 DIAGNOSIS — R1310 Dysphagia, unspecified: Secondary | ICD-10-CM | POA: Diagnosis not present

## 2016-01-02 DIAGNOSIS — Z86718 Personal history of other venous thrombosis and embolism: Secondary | ICD-10-CM

## 2016-01-02 DIAGNOSIS — M899 Disorder of bone, unspecified: Secondary | ICD-10-CM | POA: Diagnosis not present

## 2016-01-02 DIAGNOSIS — K209 Esophagitis, unspecified: Secondary | ICD-10-CM

## 2016-01-02 DIAGNOSIS — G893 Neoplasm related pain (acute) (chronic): Secondary | ICD-10-CM

## 2016-01-02 DIAGNOSIS — C3490 Malignant neoplasm of unspecified part of unspecified bronchus or lung: Secondary | ICD-10-CM

## 2016-01-02 NOTE — Progress Notes (Addendum)
[place] OFFICE PROGRESS NOTE   Diagnosis:  Small cell lung cancer  INTERVAL HISTORY:   Jenny [name] returns for follow-up. She notes significant improvement in odynophagia. Appetite is better. She is gaining weight. She denies shortness of breath. No cough. No problems with bowel or bladder.  She reports a negative brain MRI done in [place] on 12/17/2015. She also reports having restaging CT scans that showed improvement in the chest and "abnormality" at L1, L2 and the left hip. She had a bone scan here on 12/27/2015 that showed minimal increased uptake in the posterior aspect of the right 11th rib which did not appear abnormal on the CT images of August 2017; sclerotic focus noted on the previous CT scan in the body of L1 showed no abnormal uptake on the bone scan; asymmetric increased uptake in the left SI joint region which might correspond to a subtle sclerotic focus in the medial posterior aspect of the left iliac bone.  Objective:  Vital signs in last 24 hours:  Blood pressure 101/80, pulse 99, temperature 98.4 F (36.9 C), temperature source Oral, resp. rate 18, height '5\' 5"'$  (1.651 m), weight 122 lb (55.3 kg), SpO2 99 %.    HEENT: No thrush or ulcers. Lymphatics: No palpable cervical or supra-clavicular lymph nodes. Resp: Lungs clear bilaterally. Cardio: Regular rate and rhythm. GI: Abdomen soft and nontender. No hepatomegaly. Vascular: No leg edema.    Lab Results:  Lab Results  Component Value Date   WBC 11.8 (H) 11/19/2015   HGB 9.0 (L) 11/19/2015   HCT 27.1 (L) 11/19/2015   MCV 82.6 11/19/2015   PLT 97.0 (L) 11/19/2015   NEUTROABS 10.3 (H) 11/19/2015    Imaging:  No results found.  Medications: I have reviewed the patient's current medications.  Assessment/Plan: 1. Limited stage small cell lung cancer, diagnosed on biopsy of a subcarinal lymph node and left lung mass by [facility] 09/12/2015 ? PET scan 09/03/2015 confirmed a hypermetabolic  left perihilar mass, 2 left lower lobe pulmonary masses, and mediastinal lymphadenopathy, no evidence of distant metastatic disease ? Brain MRI negative for metastatic disease 09/13/2015 ? Status post chest radiation 09/26/2015 through 10/18/2015  ? Cycle 1 cisplatin/etoposide beginning 09/26/2015 ? Cycle 2 cisplatin/etoposide beginning 10/16/2015 ? Cycle 3 carboplatin/etoposide beginning 11/06/2015 ? 12/17/2015 MRI of the brain negative ? 12/17/2015 CT scans chest/abdomen/pelvis (report currently not available) ? Bone scan 12/27/2015 with minimal increased uptake in the posterior aspect of the right 11th rib that does not appear abnormal on the CT images of August 2017; sclerotic focus noted on the previous CT scan in the body of L1 showed no abnormal uptake on the current bone scan; asymmetric increased uptake in the left SI joint region possibly corresponding to a subtle sclerotic focus in the medial posterior aspect of the left iliac bone  2. 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 Weight loss secondary to #2  4. History of febrile neutropenia following chemotherapy  5. Remote history of a right leg DVT-2008?-Maintained on Lovenox  6. History of tobacco use  7. Admission to [facility]/27/2015 with neutropenic fever, esophagitis, and dehydration   Disposition:Jenny [name] appears stable. She has noted marked improvement in odynophagia related to radiation esophagitis. Her performance status is better. She is gaining weight.  She recently had restaging scans done in [place]. The images have been loaded into our system. We do not have the printed report. She reports the CT scans showed improvement in the  chest and "abnormality at L1, L2 and left hip". The recent bone scan done here does not definitively show cancer but questions the possibility of metastatic disease in the medial posterior aspect of the left iliac  bone.  She has an appointment with Dr. Tammi Klippel tomorrow to discuss prophylactic cranial radiation.  She last had chemotherapy approximately 2 months ago. At present Dr. Benay Spice recommends observation with a repeat bone scan at a three-month interval. She will bring a copy of the report of the recent restaging CT scans.  She will return for a follow-up visit in 4-6 weeks. She will contact the office in the interim with any problems.  Patient seen with Dr. Benay Spice. 25 minutes were spent face-to-face at today's visit with the majority of that time involved in counseling/coordination of care.   [place], Rys ANP/GNP-BC   01/02/2016  1:43 PM  This was a shared visit with Ned Card. We reviewed reports from the restaging studies with Jenny [name]. It is unclear whether the bone lesions noted on CT and bone scan represent metastases. I will review the scans in radiology here. I recommend observation if she appears to have metastatic disease.  She has been maintained off of systemic therapy for the past 2 months. I do not recommend further chemotherapy at present.  She will see Dr. Tammi Klippel to discuss the indication for prophylactic cranial irradiation. The decision on radiation will be influenced by the level of suspicion for metastatic disease.  Julieanne Manson, M.D.

## 2016-01-02 NOTE — Telephone Encounter (Signed)
AVS REPORT AND SCHD GIVEN PER 01/02/16 LOS.

## 2016-01-03 ENCOUNTER — Ambulatory Visit
Admission: RE | Admit: 2016-01-03 | Discharge: 2016-01-03 | Disposition: A | Discharge status: Home / Self Care | Payer: BLUE CROSS/BLUE SHIELD | Source: Ambulatory Visit | Attending: Radiation Oncology | Admitting: Radiation Oncology

## 2016-01-03 ENCOUNTER — Encounter: Payer: Self-pay | Admitting: Radiation Oncology

## 2016-01-03 ENCOUNTER — Ambulatory Visit: Payer: BLUE CROSS/BLUE SHIELD | Admitting: Physical Therapy

## 2016-01-03 DIAGNOSIS — C3492 Malignant neoplasm of unspecified part of left bronchus or lung: Secondary | ICD-10-CM | POA: Diagnosis not present

## 2016-01-03 DIAGNOSIS — Z9221 Personal history of antineoplastic chemotherapy: Secondary | ICD-10-CM | POA: Insufficient documentation

## 2016-01-03 DIAGNOSIS — Z9889 Other specified postprocedural states: Secondary | ICD-10-CM | POA: Diagnosis not present

## 2016-01-03 DIAGNOSIS — Z87891 Personal history of nicotine dependence: Secondary | ICD-10-CM | POA: Insufficient documentation

## 2016-01-03 DIAGNOSIS — Z923 Personal history of irradiation: Secondary | ICD-10-CM | POA: Insufficient documentation

## 2016-01-03 DIAGNOSIS — C3400 Malignant neoplasm of unspecified main bronchus: Secondary | ICD-10-CM

## 2016-01-03 HISTORY — DX: Personal history of irradiation: Z92.3

## 2016-01-03 HISTORY — DX: Personal history of antineoplastic chemotherapy: Z92.21

## 2016-01-03 NOTE — Progress Notes (Signed)
[place]         [phone] ________________________________  Initial outpatient Consultation  Name: Jenny [name] MRN: [id number]  Date: 01/03/2016  DOB: 08-18-1950  REFERRING PHYSICIAN: Ladell Pier, MD  DIAGNOSIS: 65 yo woman with limited stage small cell lung cancer at risk for brain metastases    ICD-9-CM ICD-10-CM   1. Small cell lung cancer, left (HCC) 162.9 C34.92     HISTORY OF PRESENT ILLNESS::Jenny [name] is a 65 y.o. female who presented to the emergency room on 08/19/2015 with chest pain, a cough, and hemoptysis. Chest x-ray revealed a left perihilar mass.     A CT of the chest confirmed a left hilar mass with mediastinal and subcarinal adenopathy. A possible left lower lobe intrapulmonary lymph node and a left lower lobe mass were seen.     PET scan 09/03/2015 confirmed a hypermetabolic 7.5 similar left perihilar mass, 2 hypermetabolic left lower lobe nodules, and hypermetabolic mediastinal lymphadenopathy. No abnormal activity in the liver, adrenal glands, or spleen. No skeletal metastases.    She was referred to pulmonary on 08/28/2015 and was scheduled for bronchoscopy on 09/09/2015. She reports feeling anxious about the 2 week wait and self-referred [facility] [place] in [place].  She underwent a bronchoscopy on 09/12/2015 . On endobronchial ultrasound a subcarinal node was identified in biopsy. A left lower mass was also identified and biopsied via endobronchial ultrasound. The cytology from a subcarinal lymph node and left lower lobe mass revealed small cell carcinoma. Immunostains on the subcarinal lymph nodes were positive for TTF-1, synaptophysin and cytokeratin AE1/AE3. The chromogranin stain was negative. The Ki-67 returned at greater than 50%.  She reports being evaluated by both medical oncology and radiation oncology. She decided to proceed with treatment in [place]. A brain MRI on 09/13/2015 revealed no  evidence of metastatic disease.  She was treated with chest radiation and 3 cycles of etoposide/cisplatin chemotherapy.  Status post BID chest radiation 09/26/2015 through 10/18/2015   Cycle 1 cisplatin/etoposide beginning 09/26/2015  Cycle 2 cisplatin/etoposide beginning 10/16/2015  Cycle 3 carboplatin/etoposide beginning 11/06/2015 She completed 3 cycles of etoposide/cisplatin chemotherapy It appears the chemotherapy was dose reduced and cisplatin was changed to carboplatin with cycle 3. She developed a neutropenic fever following cycle 2.  She completed chemotherapy and radiation in early July. She developed severe odynophagia and was [facility] 11[facility]11[facility]2015. She was treated with intravenous hydration and analgesics. An upper endoscopy on 11/12/2015 confirmed demarcated circumferential esophagitis from 20-30 cm from the incisors. She was discharged on 11/12/2015.  She has undergone re-staging with imaging at [facility].  On, 12/17/2015 MRI of the brain negative, on 12/17/2015 CT scans chest/abdomen/pelvis showed improvement.  Bone scan 12/27/2015 with minimal increased uptake in the posterior aspect of the right 11th rib that does not appear abnormal on the CT images of August 2017; sclerotic focus noted on the previous CT scan in the body of L1 showed no abnormal uptake on the current bone scan; asymmetric increased uptake in the left SI joint region possibly corresponding to a subtle sclerotic focus in the medial posterior aspect of the left iliac bone  She presents today to discuss possible prophylactic cranial irradiaiton.  PREVIOUS RADIATION THERAPY: Yes Status post chest radiation 09/26/2015 through 10/18/2015 at [facility]  Past Medical History:  Diagnosis Date  . DVT (deep venous thrombosis) ([place])    1 year ago  . History of chemotherapy   . Hx of radiation therapy   .  Hypercholesteremia   . Lung cancer [facility])   :   Past Surgical History:  Procedure Laterality Date    . CESAREAN SECTION    . ESOPHAGOGASTRODUODENOSCOPY (EGD) WITH PROPOFOL N/A 11/12/2015   Procedure: ESOPHAGOGASTRODUODENOSCOPY (EGD) WITH PROPOFOL;  Surgeon: Doran Stabler, MD;  Location: [facility] ENDOSCOPY;  Service: Endoscopy;  Laterality: N/A;  With propofol if available  :   Current Outpatient Prescriptions:  .  ALPRAZolam (XANAX) 0.25 MG tablet, Take 0.25 mg by mouth daily as needed for anxiety or sleep. , Disp: , Rfl: 0 .  atorvastatin (LIPITOR) 10 MG tablet, TAKE 1 TABLET BY MOUTH EVERY DAY (Patient taking differently: TAKE 10 MG BY MOUTH EVERY DAY), Disp: 90 tablet, Rfl: 1 .  enoxaparin (LOVENOX) 100 MG/ML injection, INJECT 0.9 MLS (90 MG TOTAL) INTO THE SKIN DAILY., Disp: 30 Syringe, Rfl: 0 .  Vitamin D, Ergocalciferol, (DRISDOL) 50000 units CAPS capsule, TAKE ONE CAPSULE BY MOUTH ONCE A WEEK ON THURSDAYS, Disp: 12 capsule, Rfl: 0:   Allergies  Allergen Reactions  . Fentanyl     Patient prefers to never take this medication again. She doesn't like the way it makes her feel.   . Morphine And Related Itching and Rash  :   Family History  Problem Relation Age of Onset  . Stroke Mother   . Hypertension Mother   . Cancer Mother     head and neck cancer and non hodgkin's lymphoma  . Cancer Father     colon/rectal and squamous cell carcinoma skin  . Cancer Paternal Uncle     lung  . Cancer Paternal Grandfather     lymphoma  . Cancer Maternal Aunt     colon cancer  . Cancer Maternal Grandfather     mesothelioma  :   Social History   Social History  . Marital status: Married    Spouse name: N/A  . Number of children: N/A  . Years of education: N/A   Occupational History  . Not on file.   Social History Main Topics  . Smoking status: Former Smoker    Packs/day: 1.00    Years: 39.00    Types: Cigarettes    Quit date: 08/20/2015  . Smokeless tobacco: Never Used  . Alcohol use No  . Drug use: No  . Sexual activity: Yes    Birth control/ protection: None   Other  Topics Concern  . Not on file   Social History Narrative  . No narrative on file  :  REVIEW OF SYSTEMS:  A 15 point review of systems is documented in the electronic medical record. This was obtained by the nursing staff. However, I reviewed this with the patient to discuss relevant findings and make appropriate changes.  A comprehensive review of systems was negative.   PHYSICAL EXAM:  Blood pressure 106/72, pulse (!) 115, resp. rate 16, height '5\' 5"'  (1.651 m), weight 120 lb (54.4 kg), SpO2 99 %. Per med-onc, HEENT: No thrush or ulcers. Lymphatics: No palpable cervical or supra-clavicular lymph nodes. Resp: Lungs clear bilaterally. Cardio: Regular rate and rhythm. GI: Abdomen soft and nontender. No hepatomegaly. Vascular: No leg edema.  KPS = 90  100 - Normal; no complaints; no evidence of disease. 90   - Able to carry on normal activity; minor signs or symptoms of disease. 80   - Normal activity with effort; some signs or symptoms of disease. 31   - Cares for self; unable to carry on normal activity or to  do active work. 60   - Requires occasional assistance, but is able to care for most of his personal needs. 50   - Requires considerable assistance and frequent medical care. 40   - Disabled; requires special care and assistance. 18   - Severely disabled; hospital admission is indicated although death not imminent. 61   - Very sick; hospital admission necessary; active supportive treatment necessary. 10   - Moribund; fatal processes progressing rapidly. 0     - Dead  Karnofsky DA, Abelmann [place], Craver LS and [facility] [phone]) The use of the nitrogen mustards in the palliative treatment of carcinoma: with particular reference to bronchogenic carcinoma Cancer 1 634-56  LABORATORY DATA:  Lab Results  Component Value Date   WBC 11.8 (H) 11/19/2015   HGB 9.0 (L) 11/19/2015   HCT 27.1 (L) 11/19/2015   MCV 82.6 11/19/2015   PLT 97.0 (L) 11/19/2015   Lab Results  Component  Value Date   NA 132 (L) 11/12/2015   K 4.0 11/12/2015   CL 103 11/12/2015   CO2 23 11/12/2015   Lab Results  Component Value Date   ALT 23 11/11/2015   AST 21 11/11/2015   ALKPHOS 45 11/11/2015   BILITOT 1.1 11/11/2015     RADIOGRAPHY: Nm Bone Scan Whole Body  Result Date: 12/27/2015 CLINICAL DATA:  Lung malignancy. Recent abnormal appearance of the spine and at left ilium on CT scan EXAM: NUCLEAR MEDICINE WHOLE BODY BONE SCAN TECHNIQUE: Whole body anterior and posterior images were obtained approximately 3 hours after intravenous injection of radiopharmaceutical. RADIOPHARMACEUTICALS:  20.1 mCi Technetium-73mMDP IV COMPARISON:  CT scan of the chest, abdomen, and pelvis of December 17, 2015 FINDINGS: There is adequate uptake of the radiopharmaceutical by the skeleton. There is adequate soft tissue clearance and renal activity. Uptake within the calvarium and spine appears normal. A sclerotic focus demonstrated on the outside CT scan to the left of midline in the body of L1 is not evident as a hyper or hypo intense focus on the bone scan. Uptake within the ribs is normal with exception of a tiny area of increased uptake in the posterior aspect of the right eleventh rib. Slight asymmetry of uptake in the posterior aspect of the left SI joint region as compared to the right is noted. This may correspond to the subtle hyperdensity on the CT scan in the posterior aspect of the medial portion of the left iliac bone. Uptake within the upper and lower extremities is normal. IMPRESSION: 1. Minimal increased uptake in the posterior aspect of the right eleventh rib is noted. This does not appear abnormal on the CT images of August 2017. 2. Sclerotic focus noted on the previous CT scan in the body of L1 exhibits no abnormal uptake on today's bone scan. 3. There is asymmetric increased uptake in the left SI joint region which may correspond to a subtle sclerotic focus in the medial posterior aspect of the left  iliac bone. This could reflect a metastatic focus. Electronically Signed   By: David  JMartiniqueM.D.   On: 12/27/2015 15:28   Mr Outside Films Head/face  Result Date: 12/27/2015 CLINICAL DATA:  This exam is stored here for comparison purposes only and was performed at an outside facility.   Please contact the originating institution for any associated interpretation or report.   Ct Outside Films Body  Result Date: 12/27/2015 CLINICAL DATA:  This exam is stored here for comparison purposes only and was performed at an  outside facility.   Please contact the originating institution for any associated interpretation or report.      IMPRESSION: 65 yo woman with limited stage small cell lung cancer at risk for brain metastases status post chemo and chest radiation with a favorable response.  She may benefit from prophylactic cranial irradiation (PCI)  PLAN:Today, I talked to the patient and family about the findings and work-up thus far.  We discussed the natural history of limited stage small cell lung cancer at risk for brain mets and general treatment, highlighting the role of PCI in the management.  We discussed the available radiation techniques, and focused on the details of logistics and delivery.  We reviewed the anticipated acute and late sequelae associated with radiation in this setting.  The patient was encouraged to ask questions that I answered to the best of my ability.  The patient would like to proceed with radiation and will be scheduled for CT simulation next Friday.  I spent time face to face with the patient and more than 50% of that time was spent in counseling and/or coordination of care.     ------------------------------------------------   Tyler Pita, MD [place] Director and Director of Stereotactic Radiosurgery Direct Dial: [phone]  Fax: [phone] [email or web address]  Skype  [email or web address]

## 2016-01-03 NOTE — Progress Notes (Signed)
Thoracic Location of Tumor / Histology: small cell lung cancer  Patient presented 08/19/15 with symptoms of: chest pain, cough and hemoptysis  Biopsies of subcarinal lymph node and left lower lobe mass (if applicable) revealed: small cell lung carcinoma, left  Tobacco/Marijuana/Snuff/ETOH use: quit 08/26/2015  Past/Anticipated interventions by cardiothoracic surgery, if any: No but, an endobronchial ultrasound a subcarinal node biopsy 09/12/15  Past/Anticipated interventions by medical oncology, if any: 3 cycles of etoposide/cisplatin at [facility] in [place]  Signs/Symptoms  Weight changes, if any: yes following chemotherapy and radiation secondary to odynophagia  Respiratory complaints, if any: patient denies a cough but, often has to clear her throat because there is still lingering esophagitis. Reports so long as she cuts her breads and meats into smaller portions she has not difficulty swallowing. Denies SOB with exertion even.   Hemoptysis, if any: NO  Pain issues, if any:  NO  SAFETY ISSUES:  Prior radiation? yes  Pacemaker/ICD? no   Possible current pregnancy?no  Is the patient on methotrexate? no  Current Complaints / other details:  65 year old female. Reports intermittent nausea and vomiting but, last episode was two weeks ago. Reports generalized weakness.  Evaluation for prophylactic cranial radiation.

## 2016-01-03 NOTE — Progress Notes (Signed)
See progress note under physician encounter. 

## 2016-01-03 NOTE — Telephone Encounter (Signed)
Refill for one month

## 2016-01-07 ENCOUNTER — Telehealth: Payer: Self-pay | Admitting: *Deleted

## 2016-01-07 NOTE — Telephone Encounter (Signed)
"  I was there last Thursday.  The scheduler or registration asked for my driver's license and did not return it to me.  I always put it in the same place and it's not there."  Called scheduling, message left with this request.  Front registration desk does not have a license and will ask registration staff to look for this.

## 2016-01-07 NOTE — Telephone Encounter (Signed)
No driver's license with [employer], [employer].  Called patient to notify her the License is not at [facility].

## 2016-01-10 ENCOUNTER — Encounter: Payer: Self-pay | Admitting: Physical Therapy

## 2016-01-10 ENCOUNTER — Ambulatory Visit: Payer: BLUE CROSS/BLUE SHIELD | Admitting: Radiation Oncology

## 2016-01-13 ENCOUNTER — Encounter: Payer: Self-pay | Admitting: Physical Therapy

## 2016-01-13 ENCOUNTER — Ambulatory Visit: Payer: BLUE CROSS/BLUE SHIELD | Attending: Family Medicine | Admitting: Physical Therapy

## 2016-01-13 DIAGNOSIS — R5381 Other malaise: Secondary | ICD-10-CM

## 2016-01-13 DIAGNOSIS — M6281 Muscle weakness (generalized): Secondary | ICD-10-CM | POA: Insufficient documentation

## 2016-01-13 NOTE — Patient Instructions (Signed)
Diaphragmatic     Imagine your rib cage expanding in 4 directions: front & back , then side to side. Imagine these directional movements with your inhale. Hands on navel, inhale through nose making navel move out toward hands. Exhale through puckered lips, hands follow exhalation in, making a quiet shhhh noise.  Repeat _5-10__ times. Rest _15__ seconds between repeats. Do 3___ times per day.  Copyright  VHI. All rights reserved.

## 2016-01-13 NOTE — Therapy (Addendum)
[facility] [address], [place], [place], [address] Phone: [phone]   Fax:  [phone]  Physical Therapy Treatment  Patient Details  Name: Jenny [name] MRN: [id number] Date of Birth: 10-26-1950 Referring Provider: Carolann Littler  Encounter Date: 01/13/2016      PT End of Session - 01/13/16 0934    Visit Number 3   Date for PT Re-Evaluation 01/20/16   PT Start Time 0930   PT Stop Time 1013   PT Time Calculation (min) 43 min   Activity Tolerance Patient tolerated treatment well   Behavior During Therapy [facility] for tasks assessed/performed      Past Medical History:  Diagnosis Date  . DVT (deep venous thrombosis) ([place])    1 year ago  . History of chemotherapy   . Hx of radiation therapy   . Hypercholesteremia   . Lung cancer [facility])     Past Surgical History:  Procedure Laterality Date  . CESAREAN SECTION    . ESOPHAGOGASTRODUODENOSCOPY (EGD) WITH PROPOFOL N/A 11/12/2015   Procedure: ESOPHAGOGASTRODUODENOSCOPY (EGD) WITH PROPOFOL;  Surgeon: Doran Stabler, MD;  Location: [facility];  Service: Endoscopy;  Laterality: N/A;  With propofol if available    There were no vitals filed for this visit.      Subjective Assessment - 01/13/16 0934    Subjective No new complaints this AM.    Currently in Pain? No/denies   Multiple Pain Sites No                         [facility] Adult PT Treatment/Exercise - 01/13/16 0001      Knee/Hip Exercises: Aerobic   Stationary Bike L1 x 10 min     Knee/Hip Exercises: Standing   Heel Raises Both;20 reps   Hip Abduction Stengthening;Both;20 reps;Knee straight     Shoulder Exercises: Seated   Horizontal ABduction Strengthening;Both;Theraband   Theraband Level (Shoulder Horizontal ABduction) Level 2 (Red)  3x10     Shoulder Exercises: ROM/Strengthening   UBE (Upper Arm Bike) L1 3x3 min with lumbar support                PT Education -  01/13/16 0949    Education provided Yes   Education Details Diaphpragmatic breathing exercises for HEP   Person(s) Educated Patient   Methods Explanation;Demonstration;Tactile cues;Verbal cues;Handout   Comprehension Verbalized understanding;Returned demonstration          PT Short Term Goals - 01/13/16 0935      PT SHORT TERM GOAL #1   Title Pt will be independent with initial HEP   Time 3   Period Weeks   Status On-going  Not consistent     PT SHORT TERM GOAL #2   Title Pt will improve bilat LE strength to 4/5 to improve ability to perform daily tasks with decreased fatigue   Time 3   Period Weeks   Status On-going           PT Long Term Goals - 12/23/15 1034      PT LONG TERM GOAL #1   Title Pt will be independent with advanced HEP   Status On-going     PT LONG TERM GOAL #2   Title Pt will decrease resting HR to <85bpm to demo improved cardiovascular health   Status On-going     PT LONG TERM GOAL #3   Title Pt will improve 6 minute walk test to >900' to  demo improved gait speed and endurance   Status On-going     PT LONG TERM GOAL #4   Title Pt will demo 4+/5 bilat LE strength to perform daily activities without increased fatigue   Status On-going     PT LONG TERM GOAL #5   Title Pt iwll improve 5 time sit to stand to <18 seconds   Status On-going               Plan - 01/13/16 0935    Clinical Impression Statement Pt has been trying to walk daily for her conditioning/fatigue. She is not doing her band exercises as consistently. She is about to embark on the last part of her treatment in [place] so she will be out of town when her plan of care expires. She was given breathing exercises today. This was very difficult for pt to do.    Rehab Potential Good   PT Frequency 2x / week   PT Duration 6 weeks   PT Treatment/Interventions Patient/family education;Gait training;Stair training;Functional mobility training;Therapeutic exercise;Therapeutic  activities;Energy conservation;Manual techniques;Cryotherapy   PT Next Visit Plan Pt will be out of town in [place] finishing the last part of her treatment. She will need an ERO visit to extend her therapy when she returns.    Consulted and Agree with Plan of Care Patient      Patient will benefit from skilled therapeutic intervention in order to improve the following deficits and impairments:  Cardiopulmonary status limiting activity, Decreased activity tolerance, Decreased strength  Visit Diagnosis: Physical deconditioning  Muscle weakness (generalized)     Problem List Patient Active Problem List   Diagnosis Date Noted  . Malnutrition of moderate degree 11/12/2015  . Dysphagia 11/11/2015  . Esophagitis 11/11/2015  . Odynophagia   . Anorexia   . Small cell lung cancer ([place]) 09/18/2015  . CAP (community acquired pneumonia) 08/28/2015  . Hemoptysis 08/28/2015  . COPD (chronic obstructive pulmonary disease) ([place]) 08/28/2015  . Lung mass 08/26/2015  . Other acute sinusitis 10/27/2013  . Anticoagulant long-term use 10/27/2013  . Cough over 2 weeks 10/27/2013  . Encounter for therapeutic drug monitoring 10/05/2013  . Hyperlipidemia 11/04/2011  . DVT, lower extremity, recurrent ([place]) 11/04/2011  . Nicotine use disorder 11/04/2011    Braydyn Schultes, PTA 01/13/2016, 10:14 AM PHYSICAL THERAPY DISCHARGE SUMMARY  Visits from Start of Care: 3  Current functional level related to goals / functional outcomes: See above for current status.  Pt didn't return after session on 01/13/16 and plan of care is now expired.     Remaining deficits: See above for most current status   Education / Equipment: HEP Plan: Patient agrees to discharge.  Patient goals were not met. Patient is being discharged due to not returning since the last visit.  ?????        Sigurd Sos, PT 02/25/16 11:55 AM   [facility] [address], [place], [place], [address] Phone: [phone]   Fax:  [phone]  Name: Jenny [name] MRN: [id number] Date of Birth: Sep 20, 1950

## 2016-01-17 ENCOUNTER — Encounter: Payer: Self-pay | Admitting: Physical Therapy

## 2016-01-20 ENCOUNTER — Ambulatory Visit: Payer: Self-pay

## 2016-01-22 ENCOUNTER — Telehealth: Payer: Self-pay | Admitting: Family Medicine

## 2016-01-22 NOTE — Telephone Encounter (Signed)
Pt would like for Dr. Elease Hashimoto to give her a call to discuss her Cancer treatment did not want to elaborate farther.

## 2016-01-22 NOTE — Telephone Encounter (Signed)
Called pt and left message to call back.

## 2016-01-24 ENCOUNTER — Encounter: Payer: Self-pay | Admitting: Physical Therapy

## 2016-01-29 ENCOUNTER — Telehealth: Payer: Self-pay | Admitting: Oncology

## 2016-01-29 ENCOUNTER — Ambulatory Visit (INDEPENDENT_AMBULATORY_CARE_PROVIDER_SITE_OTHER): Payer: BLUE CROSS/BLUE SHIELD | Admitting: Family Medicine

## 2016-01-29 VITALS — BP 80/60 | HR 125 | Temp 98.7°F | Ht 65.0 in | Wt 117.5 lb

## 2016-01-29 DIAGNOSIS — R062 Wheezing: Secondary | ICD-10-CM

## 2016-01-29 DIAGNOSIS — C3492 Malignant neoplasm of unspecified part of left bronchus or lung: Secondary | ICD-10-CM

## 2016-01-29 DIAGNOSIS — R059 Cough, unspecified: Secondary | ICD-10-CM

## 2016-01-29 DIAGNOSIS — R05 Cough: Secondary | ICD-10-CM | POA: Diagnosis not present

## 2016-01-29 MED ORDER — HYDROCODONE-HOMATROPINE 5-1.5 MG/5ML PO SYRP: 5.0000 mL | ORAL_SOLUTION | Freq: Four times a day (QID) | ORAL | 0 refills | Status: DC | PRN

## 2016-01-29 MED ORDER — ALBUTEROL SULFATE HFA 108 (90 BASE) MCG/ACT IN AERS: 2.0000 | INHALATION_SPRAY | Freq: Four times a day (QID) | RESPIRATORY_TRACT | 0 refills | Status: DC | PRN

## 2016-01-29 NOTE — Telephone Encounter (Signed)
09/28 APPOINTMENT RESCHEDULED TO 11/24 PER PATIENT REQUEST.

## 2016-01-29 NOTE — Progress Notes (Signed)
Subjective:     Patient ID: Jenny [name], female   DOB: 1950-09-02, 65 y.o.   MRN: [id number]  HPI Patient has small cell lung cancer followed at [facility] in [place]. She just recently started immunotherapy with treatment couple weeks ago. About 2 weeks ago developed cough which has been mostly nonproductive. She's had question of low-grade fever around 100. No hemoptysis. Her oncologist prescribed some type of cough medication but she is not sure what type that was. No CXR was done. Had previously taken Hycodan cough syrup for severe cough and felt that helped more.  She feels that she does have some mild wheezing intermittently. No dyspnea at rest  Past Medical History:  Diagnosis Date  . DVT (deep venous thrombosis) ([place])    1 year ago  . History of chemotherapy   . Hx of radiation therapy   . Hypercholesteremia   . Lung cancer [facility])    Past Surgical History:  Procedure Laterality Date  . CESAREAN SECTION    . ESOPHAGOGASTRODUODENOSCOPY (EGD) WITH PROPOFOL N/A 11/12/2015   Procedure: ESOPHAGOGASTRODUODENOSCOPY (EGD) WITH PROPOFOL;  Surgeon: Doran Stabler, MD;  [facility];  Service: Endoscopy;  Laterality: N/A;  With propofol if available    reports that she quit smoking about 5 months ago. Her smoking use included Cigarettes. She has a 39.00 pack-year smoking history. She has never used smokeless tobacco. She reports that she does not drink alcohol or use drugs. family history includes Cancer in her father, maternal aunt, maternal grandfather, mother, paternal grandfather, and paternal uncle; Hypertension in her mother; Stroke in her mother. Allergies  Allergen Reactions  . Fentanyl     Patient prefers to never take this medication again. She doesn't like the way it makes her feel.   . Morphine And Related Itching and Rash     Review of Systems  Constitutional: Positive for fatigue. Negative for chills.  HENT: Negative for congestion.    Respiratory: Positive for cough and wheezing.   Cardiovascular: Negative for chest pain.  Gastrointestinal: Negative for nausea and vomiting.  Neurological: Negative for dizziness.  Psychiatric/Behavioral: Negative for confusion.       Objective:   Physical Exam  Constitutional: She appears well-developed and well-nourished.  HENT:  Right Ear: External ear normal.  Left Ear: External ear normal.  Mouth/Throat: Oropharynx is clear and moist.  Cardiovascular: Normal rate and regular rhythm.   Pulmonary/Chest: Effort normal and breath sounds normal. No respiratory distress.  She has a few faint expiratory wheezes. No rales. No respiratory distress.  Musculoskeletal: She exhibits no edema.       Assessment:     Cough in a patient with small cell lung cancer with mild reactive airway changes on exam but no respiratory distress    Plan:     -Obtain chest x-ray to further evaluate -Albuterol 2 puffs every 4-6 hours as needed for wheeze and cough -Low threshold for antibiotics with her situation but get chest x-ray first -Hycodan cough syrup 1 mL and daily at bedtime only for severe nighttime cough -may need to consider brief burst of steroids but she has not tolerated well in past. -Follow-up promptly for any increased fever or shortness of breath.  Eulas Post MD [facility]

## 2016-01-29 NOTE — Progress Notes (Signed)
Pre visit review using our clinic review tool, if applicable. No additional management support is needed unless otherwise documented below in the visit note. 

## 2016-01-29 NOTE — Patient Instructions (Signed)
Go for CXR tomorrow. Drink plenty of fluids Follow up promptly for any fever or increased shortness of breath.

## 2016-01-30 ENCOUNTER — Ambulatory Visit: Payer: Self-pay | Admitting: Oncology

## 2016-01-30 ENCOUNTER — Ambulatory Visit (INDEPENDENT_AMBULATORY_CARE_PROVIDER_SITE_OTHER)
Admission: RE | Admit: 2016-01-30 | Discharge: 2016-01-30 | Disposition: A | Discharge status: Home / Self Care | Payer: BLUE CROSS/BLUE SHIELD | Source: Ambulatory Visit | Attending: Family Medicine | Admitting: Family Medicine

## 2016-01-30 DIAGNOSIS — R059 Cough, unspecified: Secondary | ICD-10-CM

## 2016-01-30 DIAGNOSIS — R05 Cough: Secondary | ICD-10-CM

## 2016-01-31 ENCOUNTER — Telehealth: Payer: Self-pay | Admitting: Family Medicine

## 2016-01-31 MED ORDER — LEVOFLOXACIN 500 MG PO TABS: 500.0000 mg | ORAL_TABLET | Freq: Every day | ORAL | 0 refills | Status: DC

## 2016-01-31 NOTE — Telephone Encounter (Signed)
Pt would like chest xray fax to dr Denice Paradise [phone] also to call pt to discuss the results

## 2016-01-31 NOTE — Telephone Encounter (Signed)
Results faxed.

## 2016-01-31 NOTE — Telephone Encounter (Signed)
Medication sent in for patient. 

## 2016-01-31 NOTE — Telephone Encounter (Signed)
I have spoken with patient.  Fax CXR to Dr Denice Paradise and make sure pt gets on Levaquin 500 mg one daily for 10 days.

## 2016-02-04 ENCOUNTER — Ambulatory Visit: Payer: Self-pay | Admitting: Physical Therapy

## 2016-02-04 NOTE — Telephone Encounter (Signed)
Autumn please refax xray to dr Denice Paradise

## 2016-02-04 NOTE — Telephone Encounter (Signed)
I have refaxed information to Dr. Denice Paradise.

## 2016-02-10 ENCOUNTER — Ambulatory Visit: Payer: Self-pay | Admitting: General Practice

## 2016-02-24 ENCOUNTER — Other Ambulatory Visit: Payer: Self-pay | Admitting: Family Medicine

## 2016-02-26 ENCOUNTER — Ambulatory Visit: Payer: Self-pay | Admitting: Family Medicine

## 2016-03-13 ENCOUNTER — Other Ambulatory Visit: Payer: Self-pay | Admitting: Family Medicine

## 2016-03-18 ENCOUNTER — Other Ambulatory Visit: Payer: Self-pay | Admitting: Family Medicine

## 2016-03-27 ENCOUNTER — Telehealth: Payer: Self-pay | Admitting: Medical Oncology

## 2016-03-27 ENCOUNTER — Ambulatory Visit (HOSPITAL_BASED_OUTPATIENT_CLINIC_OR_DEPARTMENT_OTHER): Payer: BLUE CROSS/BLUE SHIELD | Admitting: Oncology

## 2016-03-27 ENCOUNTER — Other Ambulatory Visit: Payer: Self-pay | Admitting: *Deleted

## 2016-03-27 VITALS — BP 111/71 | HR 118 | Temp 98.4°F | Resp 16 | Ht 65.0 in | Wt 111.6 lb

## 2016-03-27 DIAGNOSIS — G893 Neoplasm related pain (acute) (chronic): Secondary | ICD-10-CM | POA: Diagnosis not present

## 2016-03-27 DIAGNOSIS — C3490 Malignant neoplasm of unspecified part of unspecified bronchus or lung: Secondary | ICD-10-CM

## 2016-03-27 DIAGNOSIS — R634 Abnormal weight loss: Secondary | ICD-10-CM

## 2016-03-27 DIAGNOSIS — Z86718 Personal history of other venous thrombosis and embolism: Secondary | ICD-10-CM

## 2016-03-27 MED ORDER — TRAMADOL HCL 50 MG PO TABS: 50.0000 mg | ORAL_TABLET | Freq: Four times a day (QID) | ORAL | 0 refills | Status: DC | PRN

## 2016-03-27 NOTE — Telephone Encounter (Signed)
Pt notified to bring the disk from [facility] [place] to her appt. She voiced understanding.

## 2016-03-27 NOTE — Progress Notes (Signed)
Jenny [name] OFFICE PROGRESS NOTE   Diagnosis: Small cell lung cancer  INTERVAL HISTORY:   Jenny [name] was last seen in medical oncology 01/02/2016. She did not return for a scheduled follow-up appointment. She was diagnosed with "pneumonia "in late September . She saw Dr. Tammi Klippel but did not complete prophylactic cranial irradiation. Jenny [name] returns today with her husband. They report she returned to [facility] [place] and underwent a repeat bronchoscopy/biopsy where she was found to have "positive small cells ". She reports molecular testing was submitted on this material. She underwent restaging scans in [place] (I do not have these reports available today) and was started on immunotherapy. She reports taking 2 immunotherapy drugs every 3 weeks. She is scheduled to return to [facility] next week. She had a "rib fracture "at the right posterior chest and now has pain at the sacrum. She reports a bone scan in [place] was negative or evidence of progressive metastatic disease. No diarrhea or rash since starting the immunotherapy. Objective:  Vital signs in last 24 hours:  Blood pressure 111/71, pulse (!) 118, temperature 98.4 F (36.9 C), temperature source Oral, resp. rate 16, height '5\' 5"'$  (1.651 m), weight 111 lb 9.6 oz (50.6 kg), SpO2 97 %.    Lymphatics: No cervical, supraclavicular, or axillary nodes Resp: Rales at the left lower posterior chest, no respiratory distress Cardio: Regular rate and rhythm, tachycardia GI: No hepatomegaly Vascular: No leg edema Musculoskeletal: No tenderness or mass at the upper sacrum  Skin: No rash    Lab Results:  Lab Results  Component Value Date   WBC 11.8 (H) 11/19/2015   HGB 9.0 (L) 11/19/2015   HCT 27.1 (L) 11/19/2015   MCV 82.6 11/19/2015   PLT 97.0 (L) 11/19/2015   NEUTROABS 10.3 (H) 11/19/2015     Medications: I have reviewed the patient's current medications.  1. Limited stage small cell lung  cancer, diagnosed on biopsy of a subcarinal lymph node and left lung mass by [facility] 09/12/2015 ? PET scan 09/03/2015 confirmed a hypermetabolic left perihilar mass, 2 left lower lobe pulmonary masses, and mediastinal lymphadenopathy, no evidence of distant metastatic disease ? Brain MRI negative for metastatic disease 09/13/2015 ? Status post chest radiation 09/26/2015 through 10/18/2015  ? Cycle 1 cisplatin/etoposide beginning 09/26/2015 ? Cycle 2 cisplatin/etoposide beginning 10/16/2015 ? Cycle 3 carboplatin/etoposide beginning 11/06/2015 ? 12/17/2015 MRI of the brain negative ? 12/17/2015 CT scans chest/abdomen/pelvis (report currently not available) ? Bone scan 12/27/2015 with minimal increased uptake in the posterior aspect of the right 11th rib that does not appear abnormal on the CT images of August 2017; sclerotic focus noted on the previous CT scan in the body of L1 showed no abnormal uptake on the current bone scan; asymmetric increased uptake in the left SI joint region possibly corresponding to a subtle sclerotic focus in the medial posterior aspect of the left iliac bone  2. 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 Weight loss secondary to #2  4. History of febrile neutropenia following chemotherapy  5. Remote history of a right leg DVT-2008?-Maintained on Lovenox  6. History of tobacco use  7. Admission to [facility] 10/27/2015 with neutropenic fever, esophagitis, and dehydration     Disposition:  Jenny [name] has a history of small cell lung cancer. She is currently receiving care at the [facility] in [place]. I asked her to bring a copy of the most recent medical oncology visit,  pathology report from the recent bronchoscopy, and imaging reports with her when she returns for a follow-up visit here. It is not clear whether she has extensive stage disease, but I suspect  this.  She requested a prescription for pain medication. She will try tramadol for the sacral pain.  Jenny [name] will return for an office visit here on 04/17/2016.  Betsy Coder, MD  03/27/2016  2:11 PM

## 2016-03-30 ENCOUNTER — Telehealth: Payer: Self-pay | Admitting: *Deleted

## 2016-03-30 NOTE — Telephone Encounter (Signed)
Call placed to patient to f/u on call she made on 03/29/16 to call center regarding pain control.  Patient states that the nurse she spoke to on 03/29/16 instructed pt how to take Ultram and Tylenol together and that her pain is under control at this time.  Patient appreciative of f/u call and has no questions at this time.

## 2016-04-02 ENCOUNTER — Telehealth: Payer: Self-pay | Admitting: *Deleted

## 2016-04-02 ENCOUNTER — Telehealth: Payer: Self-pay | Admitting: Oncology

## 2016-04-02 MED ORDER — HYDROCODONE-ACETAMINOPHEN 5-325 MG PO TABS: 1.0000 | ORAL_TABLET | ORAL | 0 refills | Status: DC | PRN

## 2016-04-02 NOTE — Telephone Encounter (Signed)
Spoke with patient re 12/15 f/u.

## 2016-04-02 NOTE — Telephone Encounter (Signed)
Call from pt reporting bilateral hip pain and "tailbone" pain is no longer managed by Tylenol alternating with Tramadol. She doesn't think this pain is related to her cancer. She reports PCP is out of the office this week. Reviewed above with Dr. Benay Spice: Order received for Hydrocodone 1-2 tablets Q4hours PRN. Pt may take up to 6 tablets per day. Pt voiced appreciation. She reports she has a PET scheduled for 04/13/16 and will get a CD during that visit.

## 2016-04-03 ENCOUNTER — Ambulatory Visit: Payer: Self-pay | Admitting: Family Medicine

## 2016-04-03 ENCOUNTER — Telehealth: Payer: Self-pay | Admitting: *Deleted

## 2016-04-03 DIAGNOSIS — Z0289 Encounter for other administrative examinations: Secondary | ICD-10-CM

## 2016-04-03 NOTE — Telephone Encounter (Signed)
Patient was a no-show for today's visit. I called the pt to check on her and she stated she called yesterday to cancel the appt for today.

## 2016-04-03 NOTE — Progress Notes (Deleted)
HPI:  ROS: See pertinent positives and negatives per HPI.  Past Medical History:  Diagnosis Date  . DVT (deep venous thrombosis) ([place])    1 year ago  . History of chemotherapy   . Hx of radiation therapy   . Hypercholesteremia   . Lung cancer [facility])     Past Surgical History:  Procedure Laterality Date  . CESAREAN SECTION    . ESOPHAGOGASTRODUODENOSCOPY (EGD) WITH PROPOFOL N/A 11/12/2015   Procedure: ESOPHAGOGASTRODUODENOSCOPY (EGD) WITH PROPOFOL;  Surgeon: Doran Stabler, MD;  [facility];  Service: Endoscopy;  Laterality: N/A;  With propofol if available    Family History  Problem Relation Age of Onset  . Stroke Mother   . Hypertension Mother   . Cancer Mother     head and neck cancer and non hodgkin's lymphoma  . Cancer Father     colon/rectal and squamous cell carcinoma skin  . Cancer Paternal Uncle     lung  . Cancer Paternal Grandfather     lymphoma  . Cancer Maternal Aunt     colon cancer  . Cancer Maternal Grandfather     mesothelioma    Social History   Social History  . Marital status: Married    Spouse name: N/A  . Number of children: N/A  . Years of education: N/A   Social History Main Topics  . Smoking status: Former Smoker    Packs/day: 1.00    Years: 39.00    Types: Cigarettes    Quit date: 08/20/2015  . Smokeless tobacco: Never Used  . Alcohol use No  . Drug use: No  . Sexual activity: Yes    Birth control/ protection: None   Other Topics Concern  . Not on file   Social History Narrative  . No narrative on file     Current Outpatient Prescriptions:  .  acetaminophen (TYLENOL) 500 MG tablet, Take 500 mg by mouth every 8 (eight) hours as needed., Disp: , Rfl:  .  ALPRAZolam (XANAX) 0.25 MG tablet, Take 0.25 mg by mouth daily as needed for anxiety or sleep. , Disp: , Rfl: 0 .  atorvastatin (LIPITOR) 10 MG tablet, TAKE 1 TABLET BY MOUTH EVERY DAY, Disp: 90 tablet, Rfl: 1 .  enoxaparin (LOVENOX) 100 MG/ML injection, INJECT  0.9 MLS (90 MG TOTAL) INTO THE SKIN DAILY., Disp: 30 Syringe, Rfl: 0 .  HYDROcodone-acetaminophen (NORCO/VICODIN) 5-325 MG tablet, Take 1-2 tablets by mouth every 4 (four) hours as needed for moderate pain. May take up to 6 tablets per day., Disp: 30 tablet, Rfl: 0 .  meloxicam (MOBIC) 15 MG tablet, Take 15 mg by mouth daily. Started on 03/05/16 X 30 days from [facility], Disp: , Rfl:  .  traMADol (ULTRAM) 50 MG tablet, Take 1 tablet (50 mg total) by mouth every 6 (six) hours as needed. DO NOT DRIVE, Disp: 30 tablet, Rfl: 0 .  Vitamin D, Ergocalciferol, (DRISDOL) 50000 units CAPS capsule, TAKE ONE CAPSULE BY MOUTH ONCE A WEEK ON THURSDAYS, Disp: 12 capsule, Rfl: 4  EXAM:  There were no vitals filed for this visit.  There is no height or weight on file to calculate BMI.  GENERAL: vitals reviewed and listed above, alert, oriented, appears well hydrated and in no acute distress  HEENT: atraumatic, conjunttiva clear, no obvious abnormalities on inspection of external nose and ears  NECK: no obvious masses on inspection  LUNGS: clear to auscultation bilaterally, no wheezes, rales or rhonchi, good air movement  CV: HRRR,  no peripheral edema  MS: moves all extremities without noticeable abnormality  PSYCH: pleasant and cooperative, no obvious depression or anxiety  ASSESSMENT AND PLAN:  Discussed the following assessment and plan:  No diagnosis found.  -Patient advised to return or notify a doctor immediately if symptoms worsen or persist or new concerns arise.  There are no Patient Instructions on file for this visit.  Colin Benton R., DO

## 2016-04-15 ENCOUNTER — Telehealth: Payer: Self-pay | Admitting: *Deleted

## 2016-04-15 ENCOUNTER — Other Ambulatory Visit: Payer: Self-pay | Admitting: Oncology

## 2016-04-15 ENCOUNTER — Ambulatory Visit
Admission: RE | Admit: 2016-04-15 | Discharge: 2016-04-15 | Disposition: A | Discharge status: Home / Self Care | Payer: BLUE CROSS/BLUE SHIELD | Source: Ambulatory Visit | Attending: Oncology | Admitting: Oncology

## 2016-04-15 ENCOUNTER — Ambulatory Visit
Admission: RE | Admit: 2016-04-15 | Discharge: 2016-04-15 | Disposition: A | Discharge status: Home / Self Care | Payer: Self-pay | Source: Ambulatory Visit | Attending: Oncology | Admitting: Oncology

## 2016-04-15 DIAGNOSIS — C801 Malignant (primary) neoplasm, unspecified: Secondary | ICD-10-CM

## 2016-04-15 NOTE — Telephone Encounter (Signed)
Pt asks if she can r/s her appt w/ Alexzandria Massman this Friday to either today or tomorrow?  She has Ortho appt on Friday.  Message given to Dr. Gearldine Shown nurse.  Pt requests call back today.

## 2016-04-15 NOTE — Telephone Encounter (Signed)
Message from pt requesting to move office visit to today or tomorrow. Returned call with appt for 12/14- pt reports she is scheduled for a singular radiation treatment at [facility] on Friday but she will have CT simulation tomorrow. Requests to be seen this afternoon or as soon as possible.  Will review with providers.

## 2016-04-16 ENCOUNTER — Other Ambulatory Visit: Payer: Self-pay | Admitting: Oncology

## 2016-04-16 ENCOUNTER — Ambulatory Visit
Admission: RE | Admit: 2016-04-16 | Discharge: 2016-04-16 | Disposition: A | Discharge status: Home / Self Care | Payer: Self-pay | Source: Ambulatory Visit | Attending: Oncology | Admitting: Oncology

## 2016-04-16 DIAGNOSIS — C801 Malignant (primary) neoplasm, unspecified: Secondary | ICD-10-CM

## 2016-04-17 ENCOUNTER — Ambulatory Visit: Payer: Self-pay | Admitting: Nurse Practitioner

## 2016-04-17 NOTE — Telephone Encounter (Signed)
Late entry for 04/15/16: Ned Card, NP spoke with pt by phone. Late entry for 04/16/16: Dr. Benay Spice spoke with pt by phone. Pt will be scheduled for office visit 04/29/16.

## 2016-04-20 ENCOUNTER — Telehealth: Payer: Self-pay | Admitting: Oncology

## 2016-04-20 ENCOUNTER — Other Ambulatory Visit: Payer: Self-pay | Admitting: Family Medicine

## 2016-04-20 NOTE — Telephone Encounter (Signed)
Refill OK

## 2016-04-20 NOTE — Telephone Encounter (Signed)
sw pt to confirm appt date/time per LOS

## 2016-04-22 ENCOUNTER — Telehealth: Payer: Self-pay | Admitting: *Deleted

## 2016-04-22 NOTE — Telephone Encounter (Signed)
Call from pt reporting "bad back and hip pain." She reports she has a script from [facility] for Oxycodone '5mg'$ . Discussed with Ned Card, NP: She can try taking 2 tablets Q4hours PRN.  Returned call to pt with this information, she voiced appreciation. She will call office on 12/22 with an update, will need refill over the weekend (pt has #28 tabs remaining).

## 2016-04-24 ENCOUNTER — Telehealth: Payer: Self-pay | Admitting: *Deleted

## 2016-04-24 ENCOUNTER — Other Ambulatory Visit: Payer: Self-pay | Admitting: Nurse Practitioner

## 2016-04-24 DIAGNOSIS — C3490 Malignant neoplasm of unspecified part of unspecified bronchus or lung: Secondary | ICD-10-CM

## 2016-04-24 MED ORDER — OXYCODONE HCL 5 MG PO TABS: 5.0000 mg | ORAL_TABLET | ORAL | 0 refills | Status: DC | PRN

## 2016-04-24 NOTE — Telephone Encounter (Signed)
Message received from patient stating that her back and hip pain is "much, much better" and is "greatly improved."  Patient is requesting refill of Oxycodone 5 mg tablet.  L. Marcello Moores NP notified and prescription written for patient.

## 2016-04-24 NOTE — Telephone Encounter (Signed)
Call placed back to patient to inform her that prescription for Oxycodone is ready for pick up prior to 4:00PM today.  Patient appreciative of call and has no further questions or concerns at this time.

## 2016-04-28 ENCOUNTER — Other Ambulatory Visit: Payer: Self-pay | Admitting: *Deleted

## 2016-04-28 DIAGNOSIS — C3490 Malignant neoplasm of unspecified part of unspecified bronchus or lung: Secondary | ICD-10-CM

## 2016-04-29 ENCOUNTER — Other Ambulatory Visit: Payer: Self-pay

## 2016-04-29 ENCOUNTER — Telehealth: Payer: Self-pay | Admitting: Oncology

## 2016-04-29 ENCOUNTER — Ambulatory Visit: Payer: Self-pay | Admitting: Oncology

## 2016-04-29 NOTE — Telephone Encounter (Signed)
DR [facility] SICK - MOVED 12/27 LAB/FU TO 12/29 - DATE/TIME PER DESK NURSE. SPOKE WITH PATIENT SHE IS AWARE.

## 2016-05-01 ENCOUNTER — Telehealth: Payer: Self-pay | Admitting: *Deleted

## 2016-05-01 ENCOUNTER — Telehealth: Payer: Self-pay | Admitting: Oncology

## 2016-05-01 ENCOUNTER — Other Ambulatory Visit: Payer: Self-pay | Admitting: *Deleted

## 2016-05-01 ENCOUNTER — Other Ambulatory Visit: Payer: Self-pay

## 2016-05-01 ENCOUNTER — Ambulatory Visit (HOSPITAL_BASED_OUTPATIENT_CLINIC_OR_DEPARTMENT_OTHER): Payer: BLUE CROSS/BLUE SHIELD | Admitting: Oncology

## 2016-05-01 ENCOUNTER — Other Ambulatory Visit (HOSPITAL_BASED_OUTPATIENT_CLINIC_OR_DEPARTMENT_OTHER): Payer: BLUE CROSS/BLUE SHIELD

## 2016-05-01 ENCOUNTER — Ambulatory Visit (HOSPITAL_BASED_OUTPATIENT_CLINIC_OR_DEPARTMENT_OTHER): Payer: BLUE CROSS/BLUE SHIELD

## 2016-05-01 VITALS — BP 97/62 | HR 127 | Resp 17 | Ht 65.0 in | Wt 100.3 lb

## 2016-05-01 DIAGNOSIS — C3492 Malignant neoplasm of unspecified part of left bronchus or lung: Secondary | ICD-10-CM

## 2016-05-01 DIAGNOSIS — G893 Neoplasm related pain (acute) (chronic): Secondary | ICD-10-CM | POA: Diagnosis not present

## 2016-05-01 DIAGNOSIS — Z7189 Other specified counseling: Secondary | ICD-10-CM

## 2016-05-01 DIAGNOSIS — C7951 Secondary malignant neoplasm of bone: Secondary | ICD-10-CM | POA: Diagnosis not present

## 2016-05-01 DIAGNOSIS — C787 Secondary malignant neoplasm of liver and intrahepatic bile duct: Secondary | ICD-10-CM

## 2016-05-01 DIAGNOSIS — C3490 Malignant neoplasm of unspecified part of unspecified bronchus or lung: Secondary | ICD-10-CM

## 2016-05-01 DIAGNOSIS — K5900 Constipation, unspecified: Secondary | ICD-10-CM

## 2016-05-01 LAB — CBC WITH DIFFERENTIAL/PLATELET
BASO%: 0.4 % (ref 0.0–2.0)
BASOS ABS: 0.1 10*3/uL (ref 0.0–0.1)
EOS ABS: 0 10*3/uL (ref 0.0–0.5)
EOS%: 0.4 % (ref 0.0–7.0)
HEMATOCRIT: 28 % — AB (ref 34.8–46.6)
HEMOGLOBIN: 8.6 g/dL — AB (ref 11.6–15.9)
LYMPH%: 5.9 % — ABNORMAL LOW (ref 14.0–49.7)
MCH: 22.9 pg — AB (ref 25.1–34.0)
MCHC: 30.6 g/dL — ABNORMAL LOW (ref 31.5–36.0)
MCV: 74.8 fL — AB (ref 79.5–101.0)
MONO#: 1.3 10*3/uL — AB (ref 0.1–0.9)
MONO%: 10 % (ref 0.0–14.0)
NEUT%: 83.3 % — ABNORMAL HIGH (ref 38.4–76.8)
NEUTROS ABS: 10.5 10*3/uL — AB (ref 1.5–6.5)
PLATELETS: 496 10*3/uL — AB (ref 145–400)
RBC: 3.75 10*6/uL (ref 3.70–5.45)
RDW: 19.3 % — AB (ref 11.2–14.5)
WBC: 12.6 10*3/uL — AB (ref 3.9–10.3)
lymph#: 0.7 10*3/uL — ABNORMAL LOW (ref 0.9–3.3)

## 2016-05-01 LAB — COMPREHENSIVE METABOLIC PANEL
ALBUMIN: 2.3 g/dL — AB (ref 3.5–5.0)
ALK PHOS: 148 U/L (ref 40–150)
ALT: 33 U/L (ref 0–55)
ANION GAP: 16 meq/L — AB (ref 3–11)
AST: 30 U/L (ref 5–34)
BILIRUBIN TOTAL: 0.33 mg/dL (ref 0.20–1.20)
BUN: 15.6 mg/dL (ref 7.0–26.0)
CALCIUM: 11.2 mg/dL — AB (ref 8.4–10.4)
CO2: 24 mEq/L (ref 22–29)
Chloride: 95 mEq/L — ABNORMAL LOW (ref 98–109)
Creatinine: 1 mg/dL (ref 0.6–1.1)
EGFR: 63 mL/min/{1.73_m2} — AB (ref >90)
GLUCOSE: 192 mg/dL — AB (ref 70–140)
POTASSIUM: 4.2 meq/L (ref 3.5–5.1)
Sodium: 135 mEq/L — ABNORMAL LOW (ref 136–145)
TOTAL PROTEIN: 7.1 g/dL (ref 6.4–8.3)

## 2016-05-01 LAB — TECHNOLOGIST REVIEW

## 2016-05-01 MED ORDER — OXYCODONE HCL 5 MG PO TABS: 10.0000 mg | ORAL_TABLET | Freq: Once | ORAL | Status: DC

## 2016-05-01 MED ORDER — OXYCODONE-ACETAMINOPHEN 5-325 MG PO TABS
2.0000 | ORAL_TABLET | Freq: Once | ORAL | Status: AC
  Administered 2016-05-01: 2 via ORAL

## 2016-05-01 MED ORDER — POLYETHYLENE GLYCOL 3350 17 G PO PACK: 17.0000 g | PACK | Freq: Every day | ORAL | 0 refills | Status: DC

## 2016-05-01 MED ORDER — SODIUM CHLORIDE 0.9 % IV SOLN
1000.0000 mL | Freq: Once | INTRAVENOUS | Status: AC
  Administered 2016-05-01: 1000 mL via INTRAVENOUS

## 2016-05-01 MED ORDER — ZOLEDRONIC ACID 4 MG/100ML IV SOLN
4.0000 mg | Freq: Once | INTRAVENOUS | Status: AC
  Administered 2016-05-01: 4 mg via INTRAVENOUS
  Filled 2016-05-01: qty 100

## 2016-05-01 MED ORDER — OXYCODONE HCL 5 MG PO TABS: 5.0000 mg | ORAL_TABLET | ORAL | 0 refills | Status: DC | PRN

## 2016-05-01 MED ORDER — SENNOSIDES-DOCUSATE SODIUM 8.6-50 MG PO TABS: 1.0000 | ORAL_TABLET | Freq: Two times a day (BID) | ORAL | 0 refills | Status: AC

## 2016-05-01 NOTE — Addendum Note (Signed)
Addended by: Maryanna Shape on: 05/01/2016 02:18 PM   Modules accepted: Orders

## 2016-05-01 NOTE — Progress Notes (Signed)
START ON PATHWAY REGIMEN - Small Cell Lung  [id number]: Etoposide 100 mg/m2 Days 1, 2, 3 + Carboplatin AUC=5 Day 1 q21 Days x 4 Cycles   A cycle is every 21 days:     Etoposide (Toposar(R)) 100 mg/m2 in 500 mL NS IV over 2 hours days 1-3 Dose Mod: None     Carboplatin (Paraplatin(R)) AUC=5 in 500 mL NS IV over 1 hour day 1 only Dose Mod: None Additional Orders: * All AUC calculations intended to be used in [employer] formula  **Always confirm dose/schedule in your pharmacy ordering system**    Patient Characteristics: Extensive and Limited Stage, Second Line, Relapse > 6 Months Check here if patient was staged using an edition prior to AJCC Staging - 8th Edition (i.e., prior to May 04, 2016)? false Stage Grouping: Extensive AJCC T Category: TX AJCC N Category: NX AJCC M Category: M1c AJCC 8 Stage Grouping: IVB Line of therapy: Second Line Would you be surprised if this patient died  in the next year? I would be surprised if this patient died in the next year Time to Relapse: Relapse > 6 Months  Intent of Therapy: Non-Curative / Palliative Intent, Discussed with Patient

## 2016-05-01 NOTE — Telephone Encounter (Signed)
Message sent to chemo manager, Dr Benay Spice and Managed Care to see if the treatment dates can be accommodated, per 05/01/16 los. Patient will be notified, once chemo start date has been entered. This is a new start and will also need auth. 05/01/16 los

## 2016-05-01 NOTE — Telephone Encounter (Signed)
Per [facility] nurse, Dr Benay Spice will see patient for follow up and labs on 05/05/16. She will notify patient. 05/01/16

## 2016-05-01 NOTE — Progress Notes (Signed)
[facility] [facility] OFFICE PROGRESS NOTE   Diagnosis: Small cell lung cancer  INTERVAL HISTORY:   Ms. Reinard returns for scheduled visit. 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 She completed a single treatment with palliative radiation to the pelvis and has noted improvement in the left "hip "pain. She continues to have low back pain. She takes oxycodone for pain. She is constipated. She has not had a bowel movement for 2 weeks. Ms. Stlouis was prescribed topotecan as salvage therapy, but has not yet started this. Her family reports she spends the majority of the day in the bed or on the sofa. She has a poor appetite. She dresses herself and is ambulatory.  Objective:  Vital signs in last 24 hours:  Blood pressure 97/62, pulse (!) 127, resp. rate 17, height '5\' 5"'$  (1.651 m), weight 100 lb 4.8 oz (45.5 kg), SpO2 95 %.    HEENT: No thrush Resp: Lungs clear bilaterally Cardio: Regular rate and rhythm, tachycardia GI: No hepatomegaly, nontender Vascular: No leg edema Neuro: Alert and oriented  Musculoskeletal: No spine tenderness     Lab Results:  Lab Results  Component Value Date   WBC 12.6 (H) 05/01/2016   HGB 8.6 (L) 05/01/2016   HCT 28.0 (L) 05/01/2016   MCV 74.8 (L) 05/01/2016   PLT 496 (H) 05/01/2016   NEUTROABS 10.5 (H) 05/01/2016   BUN 15.6, creatinine 1.0, calcium 11.2, albumin 2.3  Medications: I have reviewed the patient's current medications.  1. Limited stage small cell lung cancer, diagnosed on biopsy of a subcarinal lymph node and left lung mass by [facility] 09/12/2015 ? PET scan 09/03/2015 confirmed a hypermetabolic left perihilar mass, 2 left lower lobe pulmonary masses, and mediastinal lymphadenopathy, no evidence of distant metastatic disease ? Brain MRI negative for metastatic disease 09/13/2015 ? Status post chest radiation  09/26/2015 through 10/18/2015  ? Cycle 1 cisplatin/etoposide beginning 09/26/2015 ? Cycle 2 cisplatin/etoposide beginning 10/16/2015 ? Cycle 3 carboplatin/etoposide beginning 11/06/2015 ? 12/17/2015 MRI of the brain negative ? 12/17/2015 CT scans chest/abdomen/pelvis (report currently not available) ? Bone scan 12/27/2015 with minimal increased uptake in the posterior aspect of the right 11th rib that does not appear abnormal on the CT images of August 2017; sclerotic focus noted on the previous CT scan in the body of L1 showed no abnormal uptake on the current bone scan; asymmetric increased uptake in the left SI joint region possibly corresponding to a subtle sclerotic focus in the medial posterior aspect of the left iliac bone ? CT chest 04/07/2016-progressive liver metastases  2. 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 Weight loss secondary to #2  4. History of febrile neutropenia following chemotherapy  5. Remote history of a right leg DVT-2008?-Maintained on Lovenox  6. History of tobacco use  7. Admission to [facility] 10/27/2015 with neutropenic fever, esophagitis, and dehydration  8.   Hypercalcemia 05/01/2016-status post intravenous fluids and Zometa     Disposition:  Ms. Tetrault has extensive stage small cell lung cancer. Recent imaging studies confirmed progressive metastatic disease involving the bones and liver (we do not have written reports of the studies available today-I have asked the patient and family to obtain these reports for [place]). There is clinical evidence of disease progression. She has a poor performance status and hypercalcemia.  I discussed the current situation and treatment options at length  with Ms. Sipe and her family. We discussed a trial of salvage systemic therapy versus hospice care. She understands no therapy will be curative. I reviewed the [facility] chest images from  04/07/2016. Her performance status appears borderline to receive further chemotherapy. She will be treated with intravenous fluids and Zometa today. She will return for an office visit 05/06/2015 to decide on hospice versus salvage chemotherapy. I do not recommend topotecan. We can consider repeat treatment with etoposide/carboplatin as she only received 3 cycles, last in July. We could also consider treatment with irinotecan/cisplatin on a weekly schedule  Ms. Linn will return for an office visit in one to 2017. We refilled her prescription for oxycodone. She will begin a bowel regimen.  Approximately 45 minutes were spent with the patient today. The majority of the time was used for counseling and coordination of care.  We discussed CPR and ACLS issues. She would like to remain on a full CODE STATUS.  Betsy Coder, MD  05/01/2016  11:01 AM

## 2016-05-01 NOTE — Addendum Note (Signed)
Addended by: Henreitta Leber E on: 05/01/2016 12:51 PM   Modules accepted: Orders

## 2016-05-01 NOTE — Patient Instructions (Signed)

## 2016-05-01 NOTE — Telephone Encounter (Signed)
Call placed to patient to give her the appt times for 05/05/16 and to instruct pt to take Miralax daily and Senokot S one tablet BID per order of Dr. Benay Spice.  Patient appreciative of call and has no questions at this time.

## 2016-05-05 ENCOUNTER — Ambulatory Visit (HOSPITAL_BASED_OUTPATIENT_CLINIC_OR_DEPARTMENT_OTHER): Payer: BLUE CROSS/BLUE SHIELD | Admitting: Oncology

## 2016-05-05 ENCOUNTER — Other Ambulatory Visit (HOSPITAL_BASED_OUTPATIENT_CLINIC_OR_DEPARTMENT_OTHER): Payer: BLUE CROSS/BLUE SHIELD

## 2016-05-05 VITALS — BP 86/52 | HR 122 | Temp 97.7°F | Resp 16 | Wt 100.0 lb

## 2016-05-05 DIAGNOSIS — C787 Secondary malignant neoplasm of liver and intrahepatic bile duct: Secondary | ICD-10-CM

## 2016-05-05 DIAGNOSIS — C7951 Secondary malignant neoplasm of bone: Secondary | ICD-10-CM | POA: Diagnosis not present

## 2016-05-05 DIAGNOSIS — C3492 Malignant neoplasm of unspecified part of left bronchus or lung: Secondary | ICD-10-CM | POA: Diagnosis not present

## 2016-05-05 DIAGNOSIS — C3490 Malignant neoplasm of unspecified part of unspecified bronchus or lung: Secondary | ICD-10-CM

## 2016-05-05 LAB — COMPREHENSIVE METABOLIC PANEL
ALBUMIN: 2.5 g/dL — AB (ref 3.5–5.0)
ALK PHOS: 135 U/L (ref 40–150)
ALT: 14 U/L (ref 0–55)
AST: 22 U/L (ref 5–34)
Anion Gap: 14 mEq/L — ABNORMAL HIGH (ref 3–11)
BUN: 13.7 mg/dL (ref 7.0–26.0)
CO2: 20 mEq/L — ABNORMAL LOW (ref 22–29)
Calcium: 8.7 mg/dL (ref 8.4–10.4)
Chloride: 97 mEq/L — ABNORMAL LOW (ref 98–109)
Creatinine: 0.9 mg/dL (ref 0.6–1.1)
EGFR: 73 mL/min/{1.73_m2} — AB (ref >90)
Glucose: 143 mg/dl — ABNORMAL HIGH (ref 70–140)
POTASSIUM: 4.5 meq/L (ref 3.5–5.1)
Sodium: 131 mEq/L — ABNORMAL LOW (ref 136–145)
Total Bilirubin: 0.36 mg/dL (ref 0.20–1.20)
Total Protein: 7.1 g/dL (ref 6.4–8.3)

## 2016-05-05 LAB — CBC WITH DIFFERENTIAL/PLATELET
BASO%: 0.1 % (ref 0.0–2.0)
BASOS ABS: 0 10*3/uL (ref 0.0–0.1)
EOS%: 0.1 % (ref 0.0–7.0)
Eosinophils Absolute: 0 10*3/uL (ref 0.0–0.5)
HCT: 30.4 % — ABNORMAL LOW (ref 34.8–46.6)
HEMOGLOBIN: 9 g/dL — AB (ref 11.6–15.9)
LYMPH#: 0.8 10*3/uL — AB (ref 0.9–3.3)
LYMPH%: 5.1 % — AB (ref 14.0–49.7)
MCH: 22.6 pg — AB (ref 25.1–34.0)
MCHC: 29.6 g/dL — AB (ref 31.5–36.0)
MCV: 76.4 fL — AB (ref 79.5–101.0)
MONO#: 1.5 10*3/uL — AB (ref 0.1–0.9)
MONO%: 10.1 % (ref 0.0–14.0)
NEUT#: 12.7 10*3/uL — ABNORMAL HIGH (ref 1.5–6.5)
NEUT%: 84.6 % — AB (ref 38.4–76.8)
PLATELETS: 359 10*3/uL (ref 145–400)
RBC: 3.98 10*6/uL (ref 3.70–5.45)
RDW: 17.9 % — ABNORMAL HIGH (ref 11.2–14.5)
WBC: 15 10*3/uL — ABNORMAL HIGH (ref 3.9–10.3)
nRBC: 0 % (ref 0–0)

## 2016-05-05 MED ORDER — OXYCODONE-ACETAMINOPHEN 5-325 MG PO TABS
2.0000 | ORAL_TABLET | Freq: Once | ORAL | Status: AC
  Administered 2016-05-05: 2 via ORAL

## 2016-05-05 NOTE — Progress Notes (Signed)
  [place] OFFICE PROGRESS NOTE   Diagnosis: Small cell lung cancer  INTERVAL HISTORY:   Ms. Duncanson returns as scheduled. She received intravenous fluids and Zometa on 05/01/2016. She reports feeling better. She has less confusion. She continues to have pain at the lower back. Ms. Hiers her family have discussed treatment options. She would like to proceed with a trial of salvage chemotherapy.  Objective:  Vital signs in last 24 hours:  Blood pressure (!) 86/52, pulse (!) 122, temperature 97.7 F (36.5 C), temperature source Oral, resp. rate 16, weight 100 lb (45.4 kg), SpO2 99 %.    HEENT: No thrush Resp: Lungs clear bilaterally Cardio: Regular rate and rhythm GI: No hepatomegaly, nontender Vascular: No leg edema Musculoskeletal: Mass at the right posterior chest wall     Lab Results:  Lab Results  Component Value Date   WBC 15.0 (H) 05/05/2016   HGB 9.0 (L) 05/05/2016   HCT 30.4 (L) 05/05/2016   MCV 76.4 (L) 05/05/2016   PLT 359 05/05/2016   NEUTROABS 12.7 (H) 05/05/2016    Potassium 4.5, creatinine 0.9, calcium 8.7 Medications: I have reviewed the patient's current medications.  Assessment/Plan: 1. Limited stage small cell lung cancer, diagnosed on biopsy of a subcarinal lymph node and left lung mass by [facility] 09/12/2015 ? PET scan 09/03/2015 confirmed a hypermetabolic left perihilar mass, 2 left lower lobe pulmonary masses, and mediastinal lymphadenopathy, no evidence of distant metastatic disease ? Brain MRI negative for metastatic disease 09/13/2015 ? Status post chest radiation 09/26/2015 through 10/18/2015  ? Cycle 1 cisplatin/etoposide beginning 09/26/2015 ? Cycle 2 cisplatin/etoposide beginning 10/16/2015 ? Cycle 3 carboplatin/etoposide beginning 11/06/2015 ? 12/17/2015 MRI of the brain negative ? 12/17/2015 CT scans chest/abdomen/pelvis (report currently not available) ? Bone scan 12/27/2015 with minimal increased uptake in the  posterior aspect of the right 11th rib that does not appear abnormal on the CT images of August 2017; sclerotic focus noted on the previous CT scan in the body of L1 showed no abnormal uptake on the current bone scan; asymmetric increased uptake in the left SI joint region possibly corresponding to a subtle sclerotic focus in the medial posterior aspect of the left iliac bone ? CT chest 04/07/2016-new liver metastases, new upper abdominal adenopathy, left iliac metastasis, new right posterior lateral  Pleural/chest wall metastasis  2. 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 Weight loss secondary to #2  4. History of febrile neutropenia following chemotherapy  5. Remote history of a right leg DVT-2008?-Maintained on Lovenox  6. History of tobacco use  7. Admission to [facility] 10/27/2015 with neutropenic fever, esophagitis, and dehydration  8.   Hypercalcemia 05/01/2016-status post intravenous fluids and Zometa12/29/2017       Disposition:  Mr. Luckett has extensive stage small cell lung cancer. Her performance status is partially improved today after intravenous fluids and treatment of hypercalcemia. The calcium is lower today. I discussed treatment options with Ms. Briones and her family. She understands the small expected clinical response rate associated with salvage chemotherapy. She would like to proceed with a trial of etoposide/carboplatin. This will be scheduled for a sooner as we can obtain insurance approval.  She will return for an office visit and nadir CBC following the first cycle of chemotherapy.    Betsy Coder, MD  05/05/2016  5:38 PM

## 2016-05-08 ENCOUNTER — Telehealth: Payer: Self-pay | Admitting: Oncology

## 2016-05-08 NOTE — Telephone Encounter (Signed)
Informed pt of 1/8 appt date/time per LOS

## 2016-05-11 ENCOUNTER — Ambulatory Visit (HOSPITAL_BASED_OUTPATIENT_CLINIC_OR_DEPARTMENT_OTHER): Payer: BLUE CROSS/BLUE SHIELD

## 2016-05-11 ENCOUNTER — Other Ambulatory Visit: Payer: Self-pay | Admitting: *Deleted

## 2016-05-11 ENCOUNTER — Other Ambulatory Visit (HOSPITAL_BASED_OUTPATIENT_CLINIC_OR_DEPARTMENT_OTHER): Payer: BLUE CROSS/BLUE SHIELD

## 2016-05-11 VITALS — BP 116/72 | HR 132 | Temp 98.5°F | Resp 17

## 2016-05-11 DIAGNOSIS — C3490 Malignant neoplasm of unspecified part of unspecified bronchus or lung: Secondary | ICD-10-CM

## 2016-05-11 DIAGNOSIS — Z5111 Encounter for antineoplastic chemotherapy: Secondary | ICD-10-CM | POA: Diagnosis not present

## 2016-05-11 LAB — CBC WITH DIFFERENTIAL/PLATELET
BASO%: 0.3 % (ref 0.0–2.0)
Basophils Absolute: 0 10*3/uL (ref 0.0–0.1)
EOS%: 0.1 % (ref 0.0–7.0)
Eosinophils Absolute: 0 10*3/uL (ref 0.0–0.5)
HCT: 28.1 % — ABNORMAL LOW (ref 34.8–46.6)
HEMOGLOBIN: 8.4 g/dL — AB (ref 11.6–15.9)
LYMPH%: 2.6 % — ABNORMAL LOW (ref 14.0–49.7)
MCH: 22.2 pg — ABNORMAL LOW (ref 25.1–34.0)
MCHC: 30 g/dL — ABNORMAL LOW (ref 31.5–36.0)
MCV: 74 fL — ABNORMAL LOW (ref 79.5–101.0)
MONO#: 1 10*3/uL — AB (ref 0.1–0.9)
MONO%: 7 % (ref 0.0–14.0)
NEUT%: 90 % — ABNORMAL HIGH (ref 38.4–76.8)
NEUTROS ABS: 12.7 10*3/uL — AB (ref 1.5–6.5)
PLATELETS: 352 10*3/uL (ref 145–400)
RBC: 3.8 10*6/uL (ref 3.70–5.45)
RDW: 18.9 % — AB (ref 11.2–14.5)
WBC: 14.1 10*3/uL — AB (ref 3.9–10.3)
lymph#: 0.4 10*3/uL — ABNORMAL LOW (ref 0.9–3.3)

## 2016-05-11 LAB — COMPREHENSIVE METABOLIC PANEL
ALBUMIN: 2.2 g/dL — AB (ref 3.5–5.0)
ALK PHOS: 162 U/L — AB (ref 40–150)
ALT: 9 U/L (ref 0–55)
ANION GAP: 11 meq/L (ref 3–11)
AST: 17 U/L (ref 5–34)
BUN: 13 mg/dL (ref 7.0–26.0)
CO2: 21 mEq/L — ABNORMAL LOW (ref 22–29)
Calcium: 8.8 mg/dL (ref 8.4–10.4)
Chloride: 99 mEq/L (ref 98–109)
Creatinine: 0.7 mg/dL (ref 0.6–1.1)
Glucose: 142 mg/dl — ABNORMAL HIGH (ref 70–140)
Potassium: 4.5 mEq/L (ref 3.5–5.1)
Sodium: 131 mEq/L — ABNORMAL LOW (ref 136–145)
Total Bilirubin: 0.4 mg/dL (ref 0.20–1.20)
Total Protein: 6.8 g/dL (ref 6.4–8.3)

## 2016-05-11 MED ORDER — SODIUM CHLORIDE 0.9 % IV SOLN
75.0000 mg/m2 | Freq: Once | INTRAVENOUS | Status: AC
  Administered 2016-05-11: 110 mg via INTRAVENOUS
  Filled 2016-05-11: qty 5.5

## 2016-05-11 MED ORDER — SODIUM CHLORIDE 0.9 % IV SOLN: Freq: Once | INTRAVENOUS | Status: DC

## 2016-05-11 MED ORDER — CARBOPLATIN CHEMO INJECTION 450 MG/45ML
306.8000 mg | Freq: Once | INTRAVENOUS | Status: AC
  Administered 2016-05-11: 310 mg via INTRAVENOUS
  Filled 2016-05-11: qty 31

## 2016-05-11 MED ORDER — SODIUM CHLORIDE 0.9 % IV SOLN
Freq: Once | INTRAVENOUS | Status: AC
  Administered 2016-05-11: 14:00:00 via INTRAVENOUS

## 2016-05-11 MED ORDER — PALONOSETRON HCL INJECTION 0.25 MG/5ML
0.2500 mg | Freq: Once | INTRAVENOUS | Status: AC
  Administered 2016-05-11: 0.25 mg via INTRAVENOUS

## 2016-05-11 MED ORDER — OXYCODONE HCL 5 MG PO TABS: 5.0000 mg | ORAL_TABLET | ORAL | 0 refills | Status: DC | PRN

## 2016-05-11 MED ORDER — DEXAMETHASONE SODIUM PHOSPHATE 10 MG/ML IJ SOLN
10.0000 mg | Freq: Once | INTRAMUSCULAR | Status: AC
  Administered 2016-05-11: 10 mg via INTRAVENOUS

## 2016-05-11 NOTE — Progress Notes (Signed)
Per Dr Benay Spice pt to get an additional 57ms of NS with treatment today, tomorrow and Wed.

## 2016-05-11 NOTE — Patient Instructions (Signed)
[facility] Discharge Instructions for Patients Receiving Chemotherapy  Today you received the following chemotherapy agents:  Carboplatin, Etoposide   To help prevent nausea and vomiting after your treatment, we encourage you to take your nausea medication as prescribed.   If you develop nausea and vomiting that is not controlled by your nausea medication, call the clinic.   BELOW ARE SYMPTOMS THAT SHOULD BE REPORTED IMMEDIATELY:  *FEVER GREATER THAN 100.5 F  *CHILLS WITH OR WITHOUT FEVER  NAUSEA AND VOMITING THAT IS NOT CONTROLLED WITH YOUR NAUSEA MEDICATION  *UNUSUAL SHORTNESS OF BREATH  *UNUSUAL BRUISING OR BLEEDING  TENDERNESS IN MOUTH AND THROAT WITH OR WITHOUT PRESENCE OF ULCERS  *URINARY PROBLEMS  *BOWEL PROBLEMS  UNUSUAL RASH Items with * indicate a potential emergency and should be followed up as soon as possible.  Feel free to call the clinic you have any questions or concerns. The clinic phone number is [phone].  Please show the [facility] at check-in to the Emergency Department and triage nurse.     Etoposide, VP-16 injection What is this medicine? ETOPOSIDE, VP-16 (e toe POE side) is a chemotherapy drug. It is used to treat testicular cancer, lung cancer, and other cancers. This medicine may be used for other purposes; ask your health care provider or pharmacist if you have questions. COMMON BRAND NAME(S): Etopophos, Toposar, VePesid What should I tell my health care provider before I take this medicine? They need to know if you have any of these conditions: -infection -kidney disease -liver disease -low blood counts, like low white cell, platelet, or red cell counts -an unusual or allergic reaction to etoposide, other medicines, foods, dyes, or preservatives -pregnant or trying to get pregnant -breast-feeding How should I use this medicine? This medicine is for infusion into a vein. It is administered in a hospital or  clinic by a specially trained health care professional. Talk to your pediatrician regarding the use of this medicine in children. Special care may be needed. Overdosage: If you think you have taken too much of this medicine contact a poison control center or emergency room at once. NOTE: This medicine is only for you. Do not share this medicine with others. What if I miss a dose? It is important not to miss your dose. Call your doctor or health care professional if you are unable to keep an appointment. What may interact with this medicine? -aspirin -certain medications for seizures like carbamazepine, phenobarbital, phenytoin, valproic acid -cyclosporine -levamisole -warfarin This list may not describe all possible interactions. Give your health care provider a list of all the medicines, herbs, non-prescription drugs, or dietary supplements you use. Also tell them if you smoke, drink alcohol, or use illegal drugs. Some items may interact with your medicine. What should I watch for while using this medicine? Visit your doctor for checks on your progress. This drug may make you feel generally unwell. This is not uncommon, as chemotherapy can affect healthy cells as well as cancer cells. Report any side effects. Continue your course of treatment even though you feel ill unless your doctor tells you to stop. In some cases, you may be given additional medicines to help with side effects. Follow all directions for their use. Call your doctor or health care professional for advice if you get a fever, chills or sore throat, or other symptoms of a cold or flu. Do not treat yourself. This drug decreases your body's ability to fight infections. Try to avoid being around  people who are sick. This medicine may increase your risk to bruise or bleed. Call your doctor or health care professional if you notice any unusual bleeding. Talk to your doctor about your risk of cancer. You may be more at risk for certain  types of cancers if you take this medicine. Do not become pregnant while taking this medicine or for at least 6 months after stopping it. Women should inform their doctor if they wish to become pregnant or think they might be pregnant. Women of child-bearing potential will need to have a negative pregnancy test before starting this medicine. There is a potential for serious side effects to an unborn child. Talk to your health care professional or pharmacist for more information. Do not breast-feed an infant while taking this medicine. Men must use a latex condom during sexual contact with a woman while taking this medicine and for at least 4 months after stopping it. A latex condom is needed even if you have had a vasectomy. Contact your doctor right away if your partner becomes pregnant. Do not donate sperm while taking this medicine and for at least 4 months after you stop taking this medicine. Men should inform their doctors if they wish to father a child. This medicine may lower sperm counts. What side effects may I notice from receiving this medicine? Side effects that you should report to your doctor or health care professional as soon as possible: -allergic reactions like skin rash, itching or hives, swelling of the face, lips, or tongue -low blood counts - this medicine may decrease the number of white blood cells, red blood cells and platelets. You may be at increased risk for infections and bleeding. -signs of infection - fever or chills, cough, sore throat, pain or difficulty passing urine -signs of decreased platelets or bleeding - bruising, pinpoint red spots on the skin, black, tarry stools, blood in the urine -signs of decreased red blood cells - unusually weak or tired, fainting spells, lightheadedness -breathing problems -changes in vision -mouth or throat sores or ulcers -pain, redness, swelling or irritation at the injection site -pain, tingling, numbness in the hands or  feet -redness, blistering, peeling or loosening of the skin, including inside the mouth -seizures -vomiting Side effects that usually do not require medical attention (report to your doctor or health care professional if they continue or are bothersome): -diarrhea -hair loss -loss of appetite -nausea -stomach pain This list may not describe all possible side effects. Call your doctor for medical advice about side effects. You may report side effects to FDA at 1-800-FDA-1088. Where should I keep my medicine? This drug is given in a hospital or clinic and will not be stored at home. NOTE: This sheet is a summary. It may not cover all possible information. If you have questions about this medicine, talk to your doctor, pharmacist, or health care provider.  2017 Elsevier/Gold Standard (2015-04-12 11:53:23)    Carboplatin injection What is this medicine? CARBOPLATIN (KAR boe pla tin) is a chemotherapy drug. It targets fast dividing cells, like cancer cells, and causes these cells to die. This medicine is used to treat ovarian cancer and many other cancers. This medicine may be used for other purposes; ask your health care provider or pharmacist if you have questions. COMMON BRAND NAME(S): Paraplatin What should I tell my health care provider before I take this medicine? They need to know if you have any of these conditions: -blood disorders -hearing problems -kidney disease -recent or ongoing  radiation therapy -an unusual or allergic reaction to carboplatin, cisplatin, other chemotherapy, other medicines, foods, dyes, or preservatives -pregnant or trying to get pregnant -breast-feeding How should I use this medicine? This drug is usually given as an infusion into a vein. It is administered in a hospital or clinic by a specially trained health care professional. Talk to your pediatrician regarding the use of this medicine in children. Special care may be needed. Overdosage: If you think  you have taken too much of this medicine contact a poison control center or emergency room at once. NOTE: This medicine is only for you. Do not share this medicine with others. What if I miss a dose? It is important not to miss a dose. Call your doctor or health care professional if you are unable to keep an appointment. What may interact with this medicine? -medicines for seizures -medicines to increase blood counts like filgrastim, pegfilgrastim, sargramostim -some antibiotics like amikacin, gentamicin, neomycin, streptomycin, tobramycin -vaccines Talk to your doctor or health care professional before taking any of these medicines: -acetaminophen -aspirin -ibuprofen -ketoprofen -naproxen This list may not describe all possible interactions. Give your health care provider a list of all the medicines, herbs, non-prescription drugs, or dietary supplements you use. Also tell them if you smoke, drink alcohol, or use illegal drugs. Some items may interact with your medicine. What should I watch for while using this medicine? Your condition will be monitored carefully while you are receiving this medicine. You will need important blood work done while you are taking this medicine. This drug may make you feel generally unwell. This is not uncommon, as chemotherapy can affect healthy cells as well as cancer cells. Report any side effects. Continue your course of treatment even though you feel ill unless your doctor tells you to stop. In some cases, you may be given additional medicines to help with side effects. Follow all directions for their use. Call your doctor or health care professional for advice if you get a fever, chills or sore throat, or other symptoms of a cold or flu. Do not treat yourself. This drug decreases your body's ability to fight infections. Try to avoid being around people who are sick. This medicine may increase your risk to bruise or bleed. Call your doctor or health care  professional if you notice any unusual bleeding. Be careful brushing and flossing your teeth or using a toothpick because you may get an infection or bleed more easily. If you have any dental work done, tell your dentist you are receiving this medicine. Avoid taking products that contain aspirin, acetaminophen, ibuprofen, naproxen, or ketoprofen unless instructed by your doctor. These medicines may hide a fever. Do not become pregnant while taking this medicine. Women should inform their doctor if they wish to become pregnant or think they might be pregnant. There is a potential for serious side effects to an unborn child. Talk to your health care professional or pharmacist for more information. Do not breast-feed an infant while taking this medicine. What side effects may I notice from receiving this medicine? Side effects that you should report to your doctor or health care professional as soon as possible: -allergic reactions like skin rash, itching or hives, swelling of the face, lips, or tongue -signs of infection - fever or chills, cough, sore throat, pain or difficulty passing urine -signs of decreased platelets or bleeding - bruising, pinpoint red spots on the skin, black, tarry stools, nosebleeds -signs of decreased red blood cells - unusually  weak or tired, fainting spells, lightheadedness -breathing problems -changes in hearing -changes in vision -chest pain -high blood pressure -low blood counts - This drug may decrease the number of white blood cells, red blood cells and platelets. You may be at increased risk for infections and bleeding. -nausea and vomiting -pain, swelling, redness or irritation at the injection site -pain, tingling, numbness in the hands or feet -problems with balance, talking, walking -trouble passing urine or change in the amount of urine Side effects that usually do not require medical attention (report to your doctor or health care professional if they  continue or are bothersome): -hair loss -loss of appetite -metallic taste in the mouth or changes in taste This list may not describe all possible side effects. Call your doctor for medical advice about side effects. You may report side effects to FDA at 1-800-FDA-1088. Where should I keep my medicine? This drug is given in a hospital or clinic and will not be stored at home. NOTE: This sheet is a summary. It may not cover all possible information. If you have questions about this medicine, talk to your doctor, pharmacist, or health care provider.  2017 Elsevier/Gold Standard (2007-07-26 14:38:05)

## 2016-05-12 ENCOUNTER — Ambulatory Visit (HOSPITAL_BASED_OUTPATIENT_CLINIC_OR_DEPARTMENT_OTHER): Payer: BLUE CROSS/BLUE SHIELD

## 2016-05-12 VITALS — BP 103/63 | HR 89 | Resp 18

## 2016-05-12 DIAGNOSIS — C3490 Malignant neoplasm of unspecified part of unspecified bronchus or lung: Secondary | ICD-10-CM | POA: Diagnosis not present

## 2016-05-12 DIAGNOSIS — Z5111 Encounter for antineoplastic chemotherapy: Secondary | ICD-10-CM

## 2016-05-12 MED ORDER — SODIUM CHLORIDE 0.9 % IV SOLN
INTRAVENOUS | Status: DC
  Administered 2016-05-12: 15:00:00 via INTRAVENOUS

## 2016-05-12 MED ORDER — DEXAMETHASONE SODIUM PHOSPHATE 10 MG/ML IJ SOLN
10.0000 mg | Freq: Once | INTRAMUSCULAR | Status: AC
  Administered 2016-05-12: 10 mg via INTRAVENOUS

## 2016-05-12 MED ORDER — SODIUM CHLORIDE 0.9 % IV SOLN
75.0000 mg/m2 | Freq: Once | INTRAVENOUS | Status: AC
  Administered 2016-05-12: 110 mg via INTRAVENOUS
  Filled 2016-05-12: qty 5.5

## 2016-05-12 MED ORDER — SODIUM CHLORIDE 0.9 % IV SOLN: Freq: Once | INTRAVENOUS | Status: DC

## 2016-05-12 NOTE — Progress Notes (Signed)
Pt reports that she had been doing and feeling well after first time etoposide yesterday. Denies any n/v/d fevers or chills. Pt came in today to receive additional 1/2 L of saline.

## 2016-05-12 NOTE — Patient Instructions (Signed)
[facility] Discharge Instructions for Patients Receiving Chemotherapy  Today you received the following chemotherapy agents: Etoposide   To help prevent nausea and vomiting after your treatment, we encourage you to take your nausea medication as prescribed.   If you develop nausea and vomiting that is not controlled by your nausea medication, call the clinic.   BELOW ARE SYMPTOMS THAT SHOULD BE REPORTED IMMEDIATELY:  *FEVER GREATER THAN 100.5 F  *CHILLS WITH OR WITHOUT FEVER  NAUSEA AND VOMITING THAT IS NOT CONTROLLED WITH YOUR NAUSEA MEDICATION  *UNUSUAL SHORTNESS OF BREATH  *UNUSUAL BRUISING OR BLEEDING  TENDERNESS IN MOUTH AND THROAT WITH OR WITHOUT PRESENCE OF ULCERS  *URINARY PROBLEMS  *BOWEL PROBLEMS  UNUSUAL RASH Items with * indicate a potential emergency and should be followed up as soon as possible.  Feel free to call the clinic you have any questions or concerns. The clinic phone number is [phone].  Please show the [facility] at check-in to the Emergency Department and triage nurse.     Etoposide, VP-16 injection What is this medicine? ETOPOSIDE, VP-16 (e toe POE side) is a chemotherapy drug. It is used to treat testicular cancer, lung cancer, and other cancers. This medicine may be used for other purposes; ask your health care provider or pharmacist if you have questions. COMMON BRAND NAME(S): Etopophos, Toposar, VePesid What should I tell my health care provider before I take this medicine? They need to know if you have any of these conditions: -infection -kidney disease -liver disease -low blood counts, like low white cell, platelet, or red cell counts -an unusual or allergic reaction to etoposide, other medicines, foods, dyes, or preservatives -pregnant or trying to get pregnant -breast-feeding How should I use this medicine? This medicine is for infusion into a vein. It is administered in a hospital or clinic by a  specially trained health care professional. Talk to your pediatrician regarding the use of this medicine in children. Special care may be needed. Overdosage: If you think you have taken too much of this medicine contact a poison control center or emergency room at once. NOTE: This medicine is only for you. Do not share this medicine with others. What if I miss a dose? It is important not to miss your dose. Call your doctor or health care professional if you are unable to keep an appointment. What may interact with this medicine? -aspirin -certain medications for seizures like carbamazepine, phenobarbital, phenytoin, valproic acid -cyclosporine -levamisole -warfarin This list may not describe all possible interactions. Give your health care provider a list of all the medicines, herbs, non-prescription drugs, or dietary supplements you use. Also tell them if you smoke, drink alcohol, or use illegal drugs. Some items may interact with your medicine. What should I watch for while using this medicine? Visit your doctor for checks on your progress. This drug may make you feel generally unwell. This is not uncommon, as chemotherapy can affect healthy cells as well as cancer cells. Report any side effects. Continue your course of treatment even though you feel ill unless your doctor tells you to stop. In some cases, you may be given additional medicines to help with side effects. Follow all directions for their use. Call your doctor or health care professional for advice if you get a fever, chills or sore throat, or other symptoms of a cold or flu. Do not treat yourself. This drug decreases your body's ability to fight infections. Try to avoid being around people who  are sick. This medicine may increase your risk to bruise or bleed. Call your doctor or health care professional if you notice any unusual bleeding. Talk to your doctor about your risk of cancer. You may be more at risk for certain types of  cancers if you take this medicine. Do not become pregnant while taking this medicine or for at least 6 months after stopping it. Women should inform their doctor if they wish to become pregnant or think they might be pregnant. Women of child-bearing potential will need to have a negative pregnancy test before starting this medicine. There is a potential for serious side effects to an unborn child. Talk to your health care professional or pharmacist for more information. Do not breast-feed an infant while taking this medicine. Men must use a latex condom during sexual contact with a woman while taking this medicine and for at least 4 months after stopping it. A latex condom is needed even if you have had a vasectomy. Contact your doctor right away if your partner becomes pregnant. Do not donate sperm while taking this medicine and for at least 4 months after you stop taking this medicine. Men should inform their doctors if they wish to father a child. This medicine may lower sperm counts. What side effects may I notice from receiving this medicine? Side effects that you should report to your doctor or health care professional as soon as possible: -allergic reactions like skin rash, itching or hives, swelling of the face, lips, or tongue -low blood counts - this medicine may decrease the number of white blood cells, red blood cells and platelets. You may be at increased risk for infections and bleeding. -signs of infection - fever or chills, cough, sore throat, pain or difficulty passing urine -signs of decreased platelets or bleeding - bruising, pinpoint red spots on the skin, black, tarry stools, blood in the urine -signs of decreased red blood cells - unusually weak or tired, fainting spells, lightheadedness -breathing problems -changes in vision -mouth or throat sores or ulcers -pain, redness, swelling or irritation at the injection site -pain, tingling, numbness in the hands or feet -redness,  blistering, peeling or loosening of the skin, including inside the mouth -seizures -vomiting Side effects that usually do not require medical attention (report to your doctor or health care professional if they continue or are bothersome): -diarrhea -hair loss -loss of appetite -nausea -stomach pain This list may not describe all possible side effects. Call your doctor for medical advice about side effects. You may report side effects to FDA at 1-800-FDA-1088. Where should I keep my medicine? This drug is given in a hospital or clinic and will not be stored at home. NOTE: This sheet is a summary. It may not cover all possible information. If you have questions about this medicine, talk to your doctor, pharmacist, or health care provider.  2017 [employer]vier/Gold Standard (2015-04-12 11:53:23)    Carboplatin injection What is this medicine? CARBOPLATIN (KAR boe pla tin) is a chemotherapy drug. It targets fast dividing cells, like cancer cells, and causes these cells to die. This medicine is used to treat ovarian cancer and many other cancers. This medicine may be used for other purposes; ask your health care provider or pharmacist if you have questions. COMMON BRAND NAME(S): Paraplatin What should I tell my health care provider before I take this medicine? They need to know if you have any of these conditions: -blood disorders -hearing problems -kidney disease -recent or ongoing radiation therapy -  an unusual or allergic reaction to carboplatin, cisplatin, other chemotherapy, other medicines, foods, dyes, or preservatives -pregnant or trying to get pregnant -breast-feeding How should I use this medicine? This drug is usually given as an infusion into a vein. It is administered in a hospital or clinic by a specially trained health care professional. Talk to your pediatrician regarding the use of this medicine in children. Special care may be needed. Overdosage: If you think you have taken  too much of this medicine contact a poison control center or emergency room at once. NOTE: This medicine is only for you. Do not share this medicine with others. What if I miss a dose? It is important not to miss a dose. Call your doctor or health care professional if you are unable to keep an appointment. What may interact with this medicine? -medicines for seizures -medicines to increase blood counts like filgrastim, pegfilgrastim, sargramostim -some antibiotics like amikacin, gentamicin, neomycin, streptomycin, tobramycin -vaccines Talk to your doctor or health care professional before taking any of these medicines: -acetaminophen -aspirin -ibuprofen -ketoprofen -naproxen This list may not describe all possible interactions. Give your health care provider a list of all the medicines, herbs, non-prescription drugs, or dietary supplements you use. Also tell them if you smoke, drink alcohol, or use illegal drugs. Some items may interact with your medicine. What should I watch for while using this medicine? Your condition will be monitored carefully while you are receiving this medicine. You will need important blood work done while you are taking this medicine. This drug may make you feel generally unwell. This is not uncommon, as chemotherapy can affect healthy cells as well as cancer cells. Report any side effects. Continue your course of treatment even though you feel ill unless your doctor tells you to stop. In some cases, you may be given additional medicines to help with side effects. Follow all directions for their use. Call your doctor or health care professional for advice if you get a fever, chills or sore throat, or other symptoms of a cold or flu. Do not treat yourself. This drug decreases your body's ability to fight infections. Try to avoid being around people who are sick. This medicine may increase your risk to bruise or bleed. Call your doctor or health care professional if you  notice any unusual bleeding. Be careful brushing and flossing your teeth or using a toothpick because you may get an infection or bleed more easily. If you have any dental work done, tell your dentist you are receiving this medicine. Avoid taking products that contain aspirin, acetaminophen, ibuprofen, naproxen, or ketoprofen unless instructed by your doctor. These medicines may hide a fever. Do not become pregnant while taking this medicine. Women should inform their doctor if they wish to become pregnant or think they might be pregnant. There is a potential for serious side effects to an unborn child. Talk to your health care professional or pharmacist for more information. Do not breast-feed an infant while taking this medicine. What side effects may I notice from receiving this medicine? Side effects that you should report to your doctor or health care professional as soon as possible: -allergic reactions like skin rash, itching or hives, swelling of the face, lips, or tongue -signs of infection - fever or chills, cough, sore throat, pain or difficulty passing urine -signs of decreased platelets or bleeding - bruising, pinpoint red spots on the skin, black, tarry stools, nosebleeds -signs of decreased red blood cells - unusually weak or  tired, fainting spells, lightheadedness -breathing problems -changes in hearing -changes in vision -chest pain -high blood pressure -low blood counts - This drug may decrease the number of white blood cells, red blood cells and platelets. You may be at increased risk for infections and bleeding. -nausea and vomiting -pain, swelling, redness or irritation at the injection site -pain, tingling, numbness in the hands or feet -problems with balance, talking, walking -trouble passing urine or change in the amount of urine Side effects that usually do not require medical attention (report to your doctor or health care professional if they continue or are  bothersome): -hair loss -loss of appetite -metallic taste in the mouth or changes in taste This list may not describe all possible side effects. Call your doctor for medical advice about side effects. You may report side effects to FDA at 1-800-FDA-1088. Where should I keep my medicine? This drug is given in a hospital or clinic and will not be stored at home. NOTE: This sheet is a summary. It may not cover all possible information. If you have questions about this medicine, talk to your doctor, pharmacist, or health care provider.  2017 Elsevier/Gold Standard (2007-07-26 14:38:05)

## 2016-05-13 ENCOUNTER — Other Ambulatory Visit: Payer: Self-pay | Admitting: *Deleted

## 2016-05-13 ENCOUNTER — Ambulatory Visit (HOSPITAL_BASED_OUTPATIENT_CLINIC_OR_DEPARTMENT_OTHER): Payer: BLUE CROSS/BLUE SHIELD

## 2016-05-13 VITALS — BP 112/70 | HR 88 | Temp 98.1°F | Resp 18

## 2016-05-13 DIAGNOSIS — C3490 Malignant neoplasm of unspecified part of unspecified bronchus or lung: Secondary | ICD-10-CM

## 2016-05-13 DIAGNOSIS — Z5111 Encounter for antineoplastic chemotherapy: Secondary | ICD-10-CM | POA: Diagnosis not present

## 2016-05-13 MED ORDER — SODIUM CHLORIDE 0.9 % IV SOLN
75.0000 mg/m2 | Freq: Once | INTRAVENOUS | Status: AC
  Administered 2016-05-13: 110 mg via INTRAVENOUS
  Filled 2016-05-13: qty 5.5

## 2016-05-13 MED ORDER — SODIUM CHLORIDE 0.9 % IV SOLN
INTRAVENOUS | Status: DC
  Administered 2016-05-13: 14:00:00 via INTRAVENOUS

## 2016-05-13 MED ORDER — SODIUM CHLORIDE 0.9 % IV SOLN
Freq: Once | INTRAVENOUS | Status: AC
  Administered 2016-05-13: 15:00:00 via INTRAVENOUS

## 2016-05-13 MED ORDER — DEXAMETHASONE SODIUM PHOSPHATE 10 MG/ML IJ SOLN
10.0000 mg | Freq: Once | INTRAMUSCULAR | Status: AC
  Administered 2016-05-13: 10 mg via INTRAVENOUS

## 2016-05-13 NOTE — Patient Instructions (Signed)
[facility] Discharge Instructions for Patients Receiving Chemotherapy  Today you received the following chemotherapy agents: Etoposide   To help prevent nausea and vomiting after your treatment, we encourage you to take your nausea medication as directed.    If you develop nausea and vomiting that is not controlled by your nausea medication, call the clinic.   BELOW ARE SYMPTOMS THAT SHOULD BE REPORTED IMMEDIATELY:  *FEVER GREATER THAN 100.5 F  *CHILLS WITH OR WITHOUT FEVER  NAUSEA AND VOMITING THAT IS NOT CONTROLLED WITH YOUR NAUSEA MEDICATION  *UNUSUAL SHORTNESS OF BREATH  *UNUSUAL BRUISING OR BLEEDING  TENDERNESS IN MOUTH AND THROAT WITH OR WITHOUT PRESENCE OF ULCERS  *URINARY PROBLEMS  *BOWEL PROBLEMS  UNUSUAL RASH Items with * indicate a potential emergency and should be followed up as soon as possible.  Feel free to call the clinic you have any questions or concerns. The clinic phone number is [phone].  Please show the CHEMO ALERT CARD at check-in to the Emergency Department and triage nurse.   

## 2016-05-15 ENCOUNTER — Ambulatory Visit: Payer: Self-pay

## 2016-05-17 ENCOUNTER — Emergency Department (HOSPITAL_COMMUNITY): Payer: BLUE CROSS/BLUE SHIELD

## 2016-05-17 ENCOUNTER — Encounter (HOSPITAL_COMMUNITY): Payer: Self-pay

## 2016-05-17 ENCOUNTER — Other Ambulatory Visit: Payer: Self-pay

## 2016-05-17 ENCOUNTER — Inpatient Hospital Stay (HOSPITAL_COMMUNITY)
Admission: EM | Admit: 2016-05-17 | Discharge: 2016-05-20 | DRG: 871 | Disposition: A | Discharge status: Home Health | Payer: BLUE CROSS/BLUE SHIELD | Attending: Internal Medicine | Admitting: Internal Medicine

## 2016-05-17 DIAGNOSIS — E860 Dehydration: Secondary | ICD-10-CM | POA: Diagnosis present

## 2016-05-17 DIAGNOSIS — T451X5A Adverse effect of antineoplastic and immunosuppressive drugs, initial encounter: Secondary | ICD-10-CM | POA: Diagnosis present

## 2016-05-17 DIAGNOSIS — J189 Pneumonia, unspecified organism: Secondary | ICD-10-CM | POA: Diagnosis present

## 2016-05-17 DIAGNOSIS — C3490 Malignant neoplasm of unspecified part of unspecified bronchus or lung: Secondary | ICD-10-CM | POA: Diagnosis present

## 2016-05-17 DIAGNOSIS — J440 Chronic obstructive pulmonary disease with acute lower respiratory infection: Secondary | ICD-10-CM | POA: Diagnosis present

## 2016-05-17 DIAGNOSIS — F419 Anxiety disorder, unspecified: Secondary | ICD-10-CM | POA: Diagnosis present

## 2016-05-17 DIAGNOSIS — A419 Sepsis, unspecified organism: Principal | ICD-10-CM | POA: Diagnosis present

## 2016-05-17 DIAGNOSIS — D72819 Decreased white blood cell count, unspecified: Secondary | ICD-10-CM | POA: Diagnosis present

## 2016-05-17 DIAGNOSIS — D6959 Other secondary thrombocytopenia: Secondary | ICD-10-CM | POA: Diagnosis present

## 2016-05-17 DIAGNOSIS — Z681 Body mass index (BMI) 19 or less, adult: Secondary | ICD-10-CM | POA: Diagnosis not present

## 2016-05-17 DIAGNOSIS — G9340 Encephalopathy, unspecified: Secondary | ICD-10-CM | POA: Diagnosis present

## 2016-05-17 DIAGNOSIS — Y95 Nosocomial condition: Secondary | ICD-10-CM | POA: Diagnosis present

## 2016-05-17 DIAGNOSIS — E222 Syndrome of inappropriate secretion of antidiuretic hormone: Secondary | ICD-10-CM | POA: Diagnosis present

## 2016-05-17 DIAGNOSIS — J181 Lobar pneumonia, unspecified organism: Secondary | ICD-10-CM | POA: Diagnosis not present

## 2016-05-17 DIAGNOSIS — C3492 Malignant neoplasm of unspecified part of left bronchus or lung: Secondary | ICD-10-CM | POA: Diagnosis not present

## 2016-05-17 DIAGNOSIS — R590 Localized enlarged lymph nodes: Secondary | ICD-10-CM | POA: Diagnosis present

## 2016-05-17 DIAGNOSIS — F329 Major depressive disorder, single episode, unspecified: Secondary | ICD-10-CM | POA: Diagnosis present

## 2016-05-17 DIAGNOSIS — G92 Toxic encephalopathy: Secondary | ICD-10-CM | POA: Diagnosis present

## 2016-05-17 DIAGNOSIS — Z87891 Personal history of nicotine dependence: Secondary | ICD-10-CM

## 2016-05-17 DIAGNOSIS — C7951 Secondary malignant neoplasm of bone: Secondary | ICD-10-CM | POA: Diagnosis present

## 2016-05-17 DIAGNOSIS — E7800 Pure hypercholesterolemia, unspecified: Secondary | ICD-10-CM | POA: Diagnosis present

## 2016-05-17 DIAGNOSIS — Z823 Family history of stroke: Secondary | ICD-10-CM

## 2016-05-17 DIAGNOSIS — Z8249 Family history of ischemic heart disease and other diseases of the circulatory system: Secondary | ICD-10-CM

## 2016-05-17 DIAGNOSIS — R000 Tachycardia, unspecified: Secondary | ICD-10-CM | POA: Diagnosis present

## 2016-05-17 DIAGNOSIS — D6481 Anemia due to antineoplastic chemotherapy: Secondary | ICD-10-CM | POA: Diagnosis present

## 2016-05-17 DIAGNOSIS — D630 Anemia in neoplastic disease: Secondary | ICD-10-CM | POA: Diagnosis present

## 2016-05-17 DIAGNOSIS — Z86718 Personal history of other venous thrombosis and embolism: Secondary | ICD-10-CM

## 2016-05-17 DIAGNOSIS — Z888 Allergy status to other drugs, medicaments and biological substances status: Secondary | ICD-10-CM

## 2016-05-17 DIAGNOSIS — E440 Moderate protein-calorie malnutrition: Secondary | ICD-10-CM | POA: Diagnosis present

## 2016-05-17 DIAGNOSIS — L8990 Pressure ulcer of unspecified site, unspecified stage: Secondary | ICD-10-CM | POA: Insufficient documentation

## 2016-05-17 DIAGNOSIS — E871 Hypo-osmolality and hyponatremia: Secondary | ICD-10-CM

## 2016-05-17 DIAGNOSIS — C787 Secondary malignant neoplasm of liver and intrahepatic bile duct: Secondary | ICD-10-CM | POA: Diagnosis present

## 2016-05-17 DIAGNOSIS — J439 Emphysema, unspecified: Secondary | ICD-10-CM | POA: Diagnosis not present

## 2016-05-17 DIAGNOSIS — Z79899 Other long term (current) drug therapy: Secondary | ICD-10-CM

## 2016-05-17 DIAGNOSIS — K208 Other esophagitis: Secondary | ICD-10-CM | POA: Diagnosis present

## 2016-05-17 DIAGNOSIS — R4182 Altered mental status, unspecified: Secondary | ICD-10-CM | POA: Diagnosis not present

## 2016-05-17 DIAGNOSIS — E43 Unspecified severe protein-calorie malnutrition: Secondary | ICD-10-CM | POA: Insufficient documentation

## 2016-05-17 DIAGNOSIS — Z85118 Personal history of other malignant neoplasm of bronchus and lung: Secondary | ICD-10-CM

## 2016-05-17 DIAGNOSIS — J449 Chronic obstructive pulmonary disease, unspecified: Secondary | ICD-10-CM | POA: Diagnosis present

## 2016-05-17 DIAGNOSIS — R404 Transient alteration of awareness: Secondary | ICD-10-CM | POA: Diagnosis not present

## 2016-05-17 DIAGNOSIS — I82409 Acute embolism and thrombosis of unspecified deep veins of unspecified lower extremity: Secondary | ICD-10-CM | POA: Diagnosis present

## 2016-05-17 DIAGNOSIS — Z885 Allergy status to narcotic agent status: Secondary | ICD-10-CM

## 2016-05-17 DIAGNOSIS — R0602 Shortness of breath: Secondary | ICD-10-CM | POA: Diagnosis present

## 2016-05-17 DIAGNOSIS — Z808 Family history of malignant neoplasm of other organs or systems: Secondary | ICD-10-CM

## 2016-05-17 DIAGNOSIS — Z923 Personal history of irradiation: Secondary | ICD-10-CM

## 2016-05-17 DIAGNOSIS — Z7901 Long term (current) use of anticoagulants: Secondary | ICD-10-CM

## 2016-05-17 LAB — COMPREHENSIVE METABOLIC PANEL
ALBUMIN: 2.3 g/dL — AB (ref 3.5–5.0)
ALK PHOS: 100 U/L (ref 38–126)
ALT: 10 U/L — AB (ref 14–54)
AST: 25 U/L (ref 15–41)
Anion gap: 9 (ref 5–15)
BILIRUBIN TOTAL: 0.4 mg/dL (ref 0.3–1.2)
BUN: 11 mg/dL (ref 6–20)
CALCIUM: 7.9 mg/dL — AB (ref 8.9–10.3)
CO2: 21 mmol/L — AB (ref 22–32)
CREATININE: 0.58 mg/dL (ref 0.44–1.00)
Chloride: 97 mmol/L — ABNORMAL LOW (ref 101–111)
GFR calc Af Amer: >60 mL/min (ref >60)
GFR calc non Af Amer: >60 mL/min (ref >60)
GLUCOSE: 108 mg/dL — AB (ref 65–99)
Potassium: 4.2 mmol/L (ref 3.5–5.1)
SODIUM: 127 mmol/L — AB (ref 135–145)
TOTAL PROTEIN: 5.6 g/dL — AB (ref 6.5–8.1)

## 2016-05-17 LAB — URINALYSIS, ROUTINE W REFLEX MICROSCOPIC
BILIRUBIN URINE: NEGATIVE
Glucose, UA: NEGATIVE mg/dL
HGB URINE DIPSTICK: NEGATIVE
KETONES UR: 20 mg/dL — AB
Leukocytes, UA: NEGATIVE
NITRITE: NEGATIVE
PH: 6 (ref 5.0–8.0)
Protein, ur: NEGATIVE mg/dL
SPECIFIC GRAVITY, URINE: 1.016 (ref 1.005–1.030)

## 2016-05-17 LAB — STREP PNEUMONIAE URINARY ANTIGEN: Strep Pneumo Urinary Antigen: NEGATIVE

## 2016-05-17 LAB — CBC WITH DIFFERENTIAL/PLATELET
BASOS PCT: 0 %
Basophils Absolute: 0 10*3/uL (ref 0.0–0.1)
EOS ABS: 0 10*3/uL (ref 0.0–0.7)
Eosinophils Relative: 0 %
HCT: 21.7 % — ABNORMAL LOW (ref 36.0–46.0)
HEMOGLOBIN: 6.9 g/dL — AB (ref 12.0–15.0)
LYMPHS ABS: 0.2 10*3/uL — AB (ref 0.7–4.0)
Lymphocytes Relative: 3 %
MCH: 23.2 pg — AB (ref 26.0–34.0)
MCHC: 31.8 g/dL (ref 30.0–36.0)
MCV: 73.1 fL — ABNORMAL LOW (ref 78.0–100.0)
Monocytes Absolute: 0.1 10*3/uL (ref 0.1–1.0)
Monocytes Relative: 1 %
NEUTROS ABS: 6.6 10*3/uL (ref 1.7–7.7)
Neutrophils Relative %: 96 %
Platelets: 185 10*3/uL (ref 150–400)
RBC: 2.97 MIL/uL — ABNORMAL LOW (ref 3.87–5.11)
RDW: 17.5 % — AB (ref 11.5–15.5)
WBC: 6.9 10*3/uL (ref 4.0–10.5)

## 2016-05-17 LAB — PREPARE RBC (CROSSMATCH)

## 2016-05-17 LAB — PROCALCITONIN: Procalcitonin: 0.39 ng/mL

## 2016-05-17 LAB — ABO/RH: ABO/RH(D): O POS

## 2016-05-17 LAB — I-STAT CG4 LACTIC ACID, ED
Lactic Acid, Venous: 0.98 mmol/L (ref 0.5–1.9)
Lactic Acid, Venous: 0.82 mmol/L (ref 0.5–1.9)

## 2016-05-17 LAB — MRSA PCR SCREENING: MRSA by PCR: NEGATIVE

## 2016-05-17 MED ORDER — PIPERACILLIN-TAZOBACTAM 3.375 G IVPB 30 MIN
3.3750 g | Freq: Once | INTRAVENOUS | Status: AC
  Administered 2016-05-17: 3.375 g via INTRAVENOUS
  Filled 2016-05-17: qty 50

## 2016-05-17 MED ORDER — POLYETHYLENE GLYCOL 3350 17 G PO PACK: 17.0000 g | PACK | Freq: Every day | ORAL | Status: DC | PRN

## 2016-05-17 MED ORDER — SODIUM CHLORIDE 0.9 % IV SOLN: Freq: Once | INTRAVENOUS | Status: DC

## 2016-05-17 MED ORDER — OXYCODONE HCL 5 MG PO TABS
5.0000 mg | ORAL_TABLET | Freq: Once | ORAL | Status: AC
  Administered 2016-05-17: 5 mg via ORAL
  Filled 2016-05-17: qty 1

## 2016-05-17 MED ORDER — OSELTAMIVIR PHOSPHATE 30 MG PO CAPS
30.0000 mg | ORAL_CAPSULE | Freq: Two times a day (BID) | ORAL | Status: DC
  Administered 2016-05-17 – 2016-05-20 (×7): 30 mg via ORAL
  Filled 2016-05-17 (×7): qty 1

## 2016-05-17 MED ORDER — SENNOSIDES-DOCUSATE SODIUM 8.6-50 MG PO TABS
1.0000 | ORAL_TABLET | Freq: Two times a day (BID) | ORAL | Status: DC
  Administered 2016-05-18 – 2016-05-20 (×3): 1 via ORAL
  Filled 2016-05-17 (×4): qty 1

## 2016-05-17 MED ORDER — LORAZEPAM 2 MG/ML IJ SOLN
1.0000 mg | Freq: Once | INTRAMUSCULAR | Status: AC
  Administered 2016-05-17: 1 mg via INTRAVENOUS
  Filled 2016-05-17: qty 1

## 2016-05-17 MED ORDER — GUAIFENESIN ER 600 MG PO TB12
600.0000 mg | ORAL_TABLET | Freq: Two times a day (BID) | ORAL | Status: DC
  Administered 2016-05-17 – 2016-05-20 (×7): 600 mg via ORAL
  Filled 2016-05-17 (×7): qty 1

## 2016-05-17 MED ORDER — HALOPERIDOL LACTATE 5 MG/ML IJ SOLN
1.0000 mg | Freq: Once | INTRAMUSCULAR | Status: AC
  Administered 2016-05-17: 1 mg via INTRAVENOUS
  Filled 2016-05-17: qty 1

## 2016-05-17 MED ORDER — SODIUM CHLORIDE 0.9 % IV SOLN
INTRAVENOUS | Status: DC
  Administered 2016-05-17: 75 mL/h via INTRAVENOUS
  Administered 2016-05-18 – 2016-05-20 (×3): via INTRAVENOUS

## 2016-05-17 MED ORDER — FUROSEMIDE 10 MG/ML IJ SOLN
30.0000 mg | Freq: Once | INTRAMUSCULAR | Status: AC
  Administered 2016-05-17: 30 mg via INTRAVENOUS
  Filled 2016-05-17: qty 4

## 2016-05-17 MED ORDER — VANCOMYCIN HCL IN DEXTROSE 1-5 GM/200ML-% IV SOLN
1000.0000 mg | Freq: Once | INTRAVENOUS | Status: AC
  Administered 2016-05-17: 1000 mg via INTRAVENOUS
  Filled 2016-05-17: qty 200

## 2016-05-17 MED ORDER — PIPERACILLIN-TAZOBACTAM 3.375 G IVPB
3.3750 g | Freq: Three times a day (TID) | INTRAVENOUS | Status: DC
  Administered 2016-05-17 – 2016-05-20 (×9): 3.375 g via INTRAVENOUS
  Filled 2016-05-17 (×9): qty 50

## 2016-05-17 MED ORDER — IPRATROPIUM-ALBUTEROL 0.5-2.5 (3) MG/3ML IN SOLN: 3.0000 mL | Freq: Four times a day (QID) | RESPIRATORY_TRACT | Status: DC | PRN

## 2016-05-17 MED ORDER — OXYCODONE HCL 5 MG PO TABS
5.0000 mg | ORAL_TABLET | Freq: Four times a day (QID) | ORAL | Status: DC | PRN
  Administered 2016-05-18 – 2016-05-20 (×6): 5 mg via ORAL
  Filled 2016-05-17 (×7): qty 1

## 2016-05-17 MED ORDER — HALOPERIDOL LACTATE 5 MG/ML IJ SOLN
1.0000 mg | Freq: Three times a day (TID) | INTRAMUSCULAR | Status: DC | PRN
  Administered 2016-05-17 – 2016-05-19 (×2): 1 mg via INTRAVENOUS
  Filled 2016-05-17 (×2): qty 1

## 2016-05-17 MED ORDER — VANCOMYCIN HCL IN DEXTROSE 1-5 GM/200ML-% IV SOLN
1000.0000 mg | INTRAVENOUS | Status: DC
  Administered 2016-05-18 – 2016-05-20 (×3): 1000 mg via INTRAVENOUS
  Filled 2016-05-17 (×3): qty 200

## 2016-05-17 MED ORDER — SODIUM CHLORIDE 0.9 % IV BOLUS (SEPSIS)
500.0000 mL | Freq: Once | INTRAVENOUS | Status: AC
  Administered 2016-05-17: 500 mL via INTRAVENOUS

## 2016-05-17 MED ORDER — DIPHENHYDRAMINE HCL 50 MG/ML IJ SOLN: 25.0000 mg | Freq: Once | INTRAMUSCULAR | Status: DC

## 2016-05-17 MED ORDER — ENOXAPARIN SODIUM 80 MG/0.8ML ~~LOC~~ SOLN
1.5000 mg/kg | SUBCUTANEOUS | Status: DC
  Administered 2016-05-17 – 2016-05-19 (×3): 65 mg via SUBCUTANEOUS
  Filled 2016-05-17: qty 0.8
  Filled 2016-05-17: qty 1
  Filled 2016-05-17: qty 0.8
  Filled 2016-05-17: qty 1

## 2016-05-17 MED ORDER — ALPRAZOLAM 0.5 MG PO TABS
0.5000 mg | ORAL_TABLET | Freq: Two times a day (BID) | ORAL | Status: DC | PRN
  Administered 2016-05-17 – 2016-05-19 (×4): 0.5 mg via ORAL
  Filled 2016-05-17 (×4): qty 1

## 2016-05-17 MED ORDER — DULOXETINE HCL 30 MG PO CPEP
30.0000 mg | ORAL_CAPSULE | Freq: Every day | ORAL | Status: DC
  Administered 2016-05-17 – 2016-05-20 (×4): 30 mg via ORAL
  Filled 2016-05-17 (×4): qty 1

## 2016-05-17 MED ORDER — ACETAMINOPHEN 325 MG PO TABS: 650.0000 mg | ORAL_TABLET | Freq: Four times a day (QID) | ORAL | Status: DC | PRN

## 2016-05-17 MED ORDER — ALPRAZOLAM 0.25 MG PO TABS: 0.2500 mg | ORAL_TABLET | Freq: Two times a day (BID) | ORAL | Status: DC | PRN

## 2016-05-17 MED ORDER — BUDESONIDE 0.25 MG/2ML IN SUSP
0.2500 mg | Freq: Two times a day (BID) | RESPIRATORY_TRACT | Status: DC
  Administered 2016-05-17 – 2016-05-20 (×6): 0.25 mg via RESPIRATORY_TRACT
  Filled 2016-05-17 (×6): qty 2

## 2016-05-17 MED ORDER — HALOPERIDOL LACTATE 5 MG/ML IJ SOLN: 5.0000 mg | Freq: Once | INTRAMUSCULAR | Status: DC | PRN

## 2016-05-17 MED ORDER — SODIUM CHLORIDE 0.9 % IV BOLUS (SEPSIS)
1000.0000 mL | Freq: Once | INTRAVENOUS | Status: AC
  Administered 2016-05-17: 1000 mL via INTRAVENOUS

## 2016-05-17 MED ORDER — ENSURE ENLIVE PO LIQD
237.0000 mL | Freq: Two times a day (BID) | ORAL | Status: DC
  Administered 2016-05-18 (×2): 237 mL via ORAL

## 2016-05-17 NOTE — Progress Notes (Addendum)
PHARMACY NOTE:  ANTIMICROBIAL RENAL DOSAGE ADJUSTMENT  Current antimicrobial regimen includes a mismatch between antimicrobial dosage and estimated renal function.  As per policy approved by the Pharmacy & Therapeutics and Medical Executive Committees, the antimicrobial dosage will be adjusted accordingly.  Current antimicrobial dosage:  oseltamivir 75 mg BID x 5 days  Indication: suspected influenza A  Renal Function:   Estimated Creatinine Clearance: 49 mL/min (by C-G formula based on SCr of 0.58 mg/dL). '[]'$      On intermittent HD, scheduled: '[]'$      On CRRT    Antimicrobial dosage has been changed to:  oseltamivir 30 mg BID x 5 days for CrCl 31- 60 mL/min   Thank you for allowing pharmacy to be a part of this patient's care.  Clayburn Pert, PharmD, BCPS Pager: [phone] 05/17/2016  12:49 PM

## 2016-05-17 NOTE — ED Notes (Signed)
ED Provider at bedside. EDP JAMES

## 2016-05-17 NOTE — ED Notes (Signed)
BLOOD CULTURE X1 5ML LAC

## 2016-05-17 NOTE — H&P (Signed)
History and Physical    Jenny [name] DGL:[id number] DOB: Dec 08, 1950 DOA: 05/17/2016  Referring Provider: Dr. Jeneen Rinks PCP: Jenny Post, MD  Outpatient Specialists:  Dr, Benay Spice (oncologist)  Patient coming from: home  Chief Complaint: AMS, SOB, fever.  HPI: Jenny [name] is a 66 y.o. female with PMH significant for metastatic small cell lung cancer, DVT (on lovenox), depression, anxiety, chronic anemia and moderate protein calorie malnutrition; who presented to ED with worsening SOB, altered mental status and high grade fever. Family reports patient been very confused and with difficulty to perform basic tasks (usually AAOX4). They also reported anorexia and poor PO intake. No CP, no abd pain, no dysuria, no hematuria, no hematemesis, melena or hematochezia.  Of note; patient is actively receiving chemotherapy and most recent treatment was on 05/13/16.  ED Course: patient was placed on [place] supplementation, CXR suggesting worsening LLL PNA (obstructive vs HCAP in nature); blood work demonstrated anemia with Hgb 6.9. Patient's cultures take, started on broad spectrum antibiotics and sepsis protocol initiated. Patient met criteria with fever up to 102 at home, tachycardia, elevated RR, abnormal CXR for source and encephalopathy  Review of Systems:  All other systems reviewed and apart from HPI, are negative and unable to be reviewed further given mentation abnormal mental status.  Past Medical History:  Diagnosis Date  . DVT (deep venous thrombosis) ([place])    1 year ago  . History of chemotherapy   . Hx of radiation therapy   . Hypercholesteremia   . Lung cancer [facility])     Past Surgical History:  Procedure Laterality Date  . CESAREAN SECTION    . ESOPHAGOGASTRODUODENOSCOPY (EGD) WITH PROPOFOL N/A 11/12/2015   Procedure: ESOPHAGOGASTRODUODENOSCOPY (EGD) WITH PROPOFOL;  Surgeon: Doran Stabler, MD;  Location: [facility] ENDOSCOPY;  Service: Endoscopy;  Laterality: N/A;  With propofol if  available     reports that she quit smoking about 8 months ago. Her smoking use included Cigarettes. She has a 39.00 pack-year smoking history. She has never used smokeless tobacco. She reports that she does not drink alcohol or use drugs.  Allergies  Allergen Reactions  . Fentanyl     Patient prefers to never take this medication again. She doesn't like the way it makes her feel.   . Morphine And Related Itching and Rash    Family History  Problem Relation Age of Onset  . Stroke Mother   . Hypertension Mother   . Cancer Mother     head and neck cancer and non hodgkin's lymphoma  . Cancer Father     colon/rectal and squamous cell carcinoma skin  . Cancer Paternal Uncle     lung  . Cancer Paternal Grandfather     lymphoma  . Cancer Maternal Aunt     colon cancer  . Cancer Maternal Grandfather     mesothelioma    Prior to Admission medications   Medication Sig Start Date End Date Taking? Authorizing Provider  acetaminophen (TYLENOL) 500 MG tablet Take 500 mg by mouth every 8 (eight) hours as needed.   Yes Historical Provider, MD  ALPRAZolam Duanne Moron) 0.25 MG tablet Take 0.25 mg by mouth daily as needed for anxiety or sleep.  09/10/15  Yes Historical Provider, MD  enoxaparin (LOVENOX) 100 MG/ML injection INJECT 0.9 MLS (90 MG TOTAL) INTO THE SKIN DAILY. 04/21/16  Yes Jenny Post, MD  ondansetron (ZOFRAN-ODT) 8 MG disintegrating tablet Take 8 mg by mouth every 8 (eight) hours as needed for nausea or  vomiting.   Yes Historical Provider, MD  oxyCODONE (OXY IR/ROXICODONE) 5 MG immediate release tablet Take 1-2 tablets (5-10 mg total) by mouth every 4 (four) hours as needed. You cannot drive while taking this medication. 05/11/16  Yes Ladell Pier, MD  senna-docusate (SENOKOT-S) 8.6-50 MG tablet Take 1 tablet by mouth 2 (two) times daily. 05/01/16  Yes Ladell Pier, MD  Vitamin D, Ergocalciferol, (DRISDOL) 50000 units CAPS capsule TAKE ONE CAPSULE BY MOUTH ONCE A WEEK ON THURSDAYS  02/25/16  Yes Jenny Post, MD  DULoxetine (CYMBALTA) 30 MG capsule Take 30 mg by mouth daily. 05/04/16   Historical Provider, MD  meloxicam (MOBIC) 15 MG tablet Take 15 mg by mouth daily. Started on 03/05/16 X 30 days from [place] Provider, MD  polyethylene glycol (MIRALAX) packet Take 17 g by mouth daily. Patient not taking: Reported on 05/17/2016 05/01/16   Ladell Pier, MD    Physical Exam: Vitals:   05/17/16 1132 05/17/16 1142 05/17/16 1151 05/17/16 1220  BP: (!) 89/54 112/68  100/60  Pulse: 107 103  100  Resp: 26 18    Temp:      TempSrc:      SpO2: 98%  99% 96%  Weight:      Height:        Constitutional: in acute distress, with tachypnea and mild use of accessory muscles. Patient warm to touch and very confused/agitated. Patient pale and underweight on appearance. Eyes: PERTLA, lids and conjunctivae normal, no icterus. ENMT: Mucous membranes dry on exam and with positive cracked lips. Posterior pharynx clear of any exudate or lesions. No thrush, fair dentition.  Neck: normal, supple, no masses, no thyromegaly, no JVD Respiratory: positive tachypnea, present rhonchi and exp wheezing, fair movement. Mild accessory muscle use.  Cardiovascular: tachycardia, sinus rhythm, no murmurs or gallops. No extremity edema. 2+ pedal pulses. No carotid bruits.  Abdomen: No distension, no tenderness, no masses palpated. No hepatosplenomegaly. Bowel sounds normal.  Musculoskeletal: no clubbing / cyanosis. No joint deformity upper and lower extremities. Good ROM, no contractures. Normal muscle tone.  Skin: no rashes, bruises or open wounds appreciated. Neurologic: difficult to assess due to confusion. But CN appear to be intact and patient is moving four limbs spontaneously. Oriented X1.  Psychiatric: confused, agitated and with difficulties following and comprehending commands.     Labs on Admission: I have personally reviewed following labs and imaging studies  CBC:  Recent  Labs Lab 05/11/16 1248 05/17/16 0745  WBC 14.1* 6.9  NEUTROABS 12.7* 6.6  HGB 8.4* 6.9*  HCT 28.1* 21.7*  MCV 74.0* 73.1*  PLT 352 941   Basic Metabolic Panel:  Recent Labs Lab 05/11/16 1248 05/17/16 0745  NA 131* 127*  K 4.5 4.2  CL  --  97*  CO2 21* 21*  GLUCOSE 142* 108*  BUN 13.0 11  CREATININE 0.7 0.58  CALCIUM 8.8 7.9*   GFR: Estimated Creatinine Clearance: 49 mL/min (by C-G formula based on SCr of 0.58 mg/dL).   Liver Function Tests:  Recent Labs Lab 05/11/16 1248 05/17/16 0745  AST 17 25  ALT 9 10*  ALKPHOS 162* 100  BILITOT 0.40 0.4  PROT 6.8 5.6*  ALBUMIN 2.2* 2.3*   Urine analysis:    Component Value Date/Time   COLORURINE YELLOW 05/17/2016 0933   APPEARANCEUR CLEAR 05/17/2016 0933   LABSPEC 1.016 05/17/2016 0933   PHURINE 6.0 05/17/2016 0933   GLUCOSEU NEGATIVE 05/17/2016 0933   HGBUR NEGATIVE 05/17/2016  [place] 05/17/2016 0933   BILIRUBINUR negative 04/18/2015 0856   KETONESUR 20 (A) 05/17/2016 0933   PROTEINUR NEGATIVE 05/17/2016 0933   UROBILINOGEN 0.2 04/18/2015 0856   NITRITE NEGATIVE 05/17/2016 0933   LEUKOCYTESUR NEGATIVE 05/17/2016 0933    Radiological Exams on Admission: Dg Chest Port 1 View  Result Date: 05/17/2016 CLINICAL DATA:  Fever and disorientation x 2 days; patient is undergoing chemo for lung CA EXAM: PORTABLE CHEST 1 VIEW COMPARISON:  01/30/2016 FINDINGS: Midline trachea. Patient minimally rotated left. Mild cardiomegaly. Probable small left pleural effusion. Clear right lung. Worsened left base aeration. Suspicion of left suprahilar soft tissue fullness, new since the prior exam. IMPRESSION: Worsened left lower lobe aeration with development of pleural fluid and increased airspace disease. Given the appearance on prior CT, this airspace disease could represent infection, postobstructive pneumonitis, or could be treatment related. Left suprahilar soft tissue fullness could represent progressive  adenopathy or a focus of infection. Electronically Signed   By: Abigail Miyamoto M.D.   On: 05/17/2016 09:29    EKG:  Sinus tachycardia, no ischemic changes   Assessment/Plan 1-Sepsis: due to HCAP vs Flu -patient met sepsis criteria on admission with ongoing high temp (102) while at home, tachycardia, abnormal CXR and acute encephalopathy (associated with infection process) -given active chemotherapy, will treat for HCAP -started on vanc and zosyn and empirically on tamiflu -admitted to stepdown bed -oxygen supplementation, nebulizer treatment, pulmicort, and flutter valve -patient started on mucinex BID -will follow clinical response and weaned oxygen supplementation as tolerated -will follow blood cx's, check procalcitonin, strep pneumo antigen, legionella antigen and urine culture -lactic acid was normal  2-Encephalopathy: most likely toxic and due to sepsis -will treat with antibiotics and supportive care as mentioned above -low Hgb and hyponatremia contributing -will transfuse 2 units of PRBC's and will replete/follow electrolytes  -will check TSH, B12, RPR and CT head.  3-DVT, lower extremity, recurrent ([place]) -continue daily lovenox shot  4-COPD (chronic obstructive pulmonary disease) ([place]) -patient with slight wheezing -will use Duoneb as needed and pulmicort  5-Small cell lung cancer ([place]) Dr. Benay Spice contacted and made aware of admission -will follow rec's  6-Malnutrition of moderate degree -nutritional service consulted -will use ensure BID and follow rec's for supplementation  -Body mass index is 16.31 kg/m.   7-Anemia associated with chemotherapy -Hgb 6.9 -patient pale and with associated symptoms -will transfuse 2 units of PRBC's -will follow Hgb trend   8-Dehydration with hyponatremia  -will provide gentle IVF's resuscitation and will follow electrolytes -even SIADH from lung malignancy possible; but no signs of fluid overload on exam -will  monitor  9-depression and anxiety  -will continue PRN alprazolam and continue cymbalta (last one husband not sure, but think patient was taken off it; will review with pharmacy)  Time: 70 minutes   DVT prophylaxis: on chronic lovenox.  Code Status: Full Family Communication: Son at bedside   Disposition Plan: to be determined; currently unclear. But will hope for discharge home once medically stable (breathing stabilized, mentation back to baseline and patient capable of caring/helping taken care of herself. Consults called: oncology made aware of admission (Dr. Benay Spice) Admission status: inpatient, LOS > 2 midnights, stepdown bed    Barton Dubois MD [facility] Pager [phone]  If 7PM-7AM, please contact night-coverage [email or web address] Password [facility]  05/17/2016, 12:38 PM

## 2016-05-17 NOTE — ED Notes (Addendum)
In and out cath preformed by NT Almyra Free, witnessed by NT [employer]

## 2016-05-17 NOTE — ED Notes (Signed)
RN made aware pt possible having withdraws from benzo and opiates that have not been taken for several days. Admitting MD Dyann Kief informed husband of this evaluation.

## 2016-05-17 NOTE — Progress Notes (Signed)
Pt instructed on use of Flutter valve.  Pt used as instructed with lots of coaching and encouragement.  Pt will need re instruction.  RT to monitor and assess as needed.

## 2016-05-17 NOTE — ED Notes (Signed)
Bed: WA04 Expected date:  Expected time:  Means of arrival:  Comments: 66 yo F/AMS-Chemo

## 2016-05-17 NOTE — Progress Notes (Signed)
Pharmacy Antibiotic Note  Jenny [name] is a 66 y.o. female admitted on 05/17/2016 with sepsis.  Pharmacy has been consulted for Vancomycin & Zosyn dosing. 05/17/2016:  CXR +worsened LLL PNA  Afebrile, WBP wnl  LA elevated, but trending down  Scr at patient's baseline.  Estimated CrCl ~ 54m/min.   Plan: Vancomycin '1000mg'$  IV every 24 hours.  Goal trough 15-20 mcg/mL. Zosyn 3.375g IV q8h (4 hour infusion).  Check Vancomycin trough at steady state Monitor renal function and cx data  Consider check MRSA PCR   Height: '5\' 4"'$  (162.6 cm) Weight: 95 lb (43.1 kg) IBW/kg (Calculated) : 54.7  Temp (24hrs), Avg:98.6 F (37 C), Min:98.3 F (36.8 C), Max:98.9 F (37.2 C)   Recent Labs Lab 05/11/16 1248 05/11/16 1248 05/17/16 0757  WBC 14.1*  --   --   CREATININE  --  0.7  --   LATICACIDVEN  --   --  0.98    Estimated Creatinine Clearance: 49 mL/min (by C-G formula based on SCr of 0.7 mg/dL).    Allergies  Allergen Reactions  . Fentanyl     Patient prefers to never take this medication again. She doesn't like the way it makes her feel.   . Morphine And Related Itching and Rash    Antimicrobials this admission: 1/14 Vanc >>  1/14 Zosyn >>   Dose adjustments this admission:  Microbiology results: 1/14 BCx: ordered 1/14 UCx: ordered  MRSA PCR:  Thank you for allowing pharmacy to be a part of this patient's care.  LBiagio Borg1/14/2018 8:05 AM

## 2016-05-17 NOTE — ED Triage Notes (Signed)
Per [facility]- Pt resides at home. Pt HX of cancer and receives CHEMO Wednesday, Fever-102.9 axil, tylenol 450 mg liquid given. Unable to give total. Pt confused. Normally A and O x 4 now A and O x 1.  Bolus infusing, Zofran 4 mg given. Son present

## 2016-05-17 NOTE — ED Notes (Signed)
[place] RN AWARE OF BED ASSIGNMENT

## 2016-05-17 NOTE — ED Notes (Signed)
Pt with continued agitation. Family unable to console with consistency. EDP Jeneen Rinks requested to evaluate pt before transport. EDP Jeneen Rinks evaluated pt and spoke with husband.  Orders given. EDP requested pt not be transferred to floor until reevaluated by EDP. Shortly after, Admission [place] MD in to evaluate pt. Orders also given. See record. Medication given as ordered.

## 2016-05-17 NOTE — ED Notes (Signed)
CODE SEPSIS 

## 2016-05-17 NOTE — ED Notes (Signed)
HUSBAND PRESENT

## 2016-05-17 NOTE — ED Notes (Signed)
SON PRESENT

## 2016-05-18 ENCOUNTER — Telehealth: Payer: Self-pay | Admitting: *Deleted

## 2016-05-18 ENCOUNTER — Inpatient Hospital Stay (HOSPITAL_COMMUNITY): Payer: BLUE CROSS/BLUE SHIELD

## 2016-05-18 DIAGNOSIS — L8990 Pressure ulcer of unspecified site, unspecified stage: Secondary | ICD-10-CM | POA: Insufficient documentation

## 2016-05-18 DIAGNOSIS — C787 Secondary malignant neoplasm of liver and intrahepatic bile duct: Secondary | ICD-10-CM

## 2016-05-18 DIAGNOSIS — C3492 Malignant neoplasm of unspecified part of left bronchus or lung: Secondary | ICD-10-CM

## 2016-05-18 DIAGNOSIS — C7951 Secondary malignant neoplasm of bone: Secondary | ICD-10-CM

## 2016-05-18 DIAGNOSIS — R4182 Altered mental status, unspecified: Secondary | ICD-10-CM

## 2016-05-18 DIAGNOSIS — G893 Neoplasm related pain (acute) (chronic): Secondary | ICD-10-CM

## 2016-05-18 LAB — TYPE AND SCREEN
BLOOD PRODUCT EXPIRATION DATE: 201801262359
Blood Product Expiration Date: 201801262359
ISSUE DATE / TIME: 201801141527
ISSUE DATE / TIME: 201801141734
Unit Type and Rh: 5100
Unit Type and Rh: 5100

## 2016-05-18 LAB — RESPIRATORY PANEL BY PCR
ADENOVIRUS-RVPPCR: NOT DETECTED
Bordetella pertussis: NOT DETECTED
CORONAVIRUS NL63-RVPPCR: DETECTED — AB
Chlamydophila pneumoniae: NOT DETECTED
Coronavirus 229E: NOT DETECTED
Coronavirus HKU1: NOT DETECTED
Coronavirus OC43: NOT DETECTED
INFLUENZA A-RVPPCR: NOT DETECTED
INFLUENZA B-RVPPCR: NOT DETECTED
MYCOPLASMA PNEUMONIAE-RVPPCR: NOT DETECTED
Metapneumovirus: NOT DETECTED
PARAINFLUENZA VIRUS 1-RVPPCR: NOT DETECTED
PARAINFLUENZA VIRUS 4-RVPPCR: NOT DETECTED
Parainfluenza Virus 2: NOT DETECTED
Parainfluenza Virus 3: NOT DETECTED
RESPIRATORY SYNCYTIAL VIRUS-RVPPCR: NOT DETECTED
Rhinovirus / Enterovirus: NOT DETECTED

## 2016-05-18 LAB — BASIC METABOLIC PANEL
ANION GAP: 12 (ref 5–15)
BUN: 10 mg/dL (ref 6–20)
CALCIUM: 7.8 mg/dL — AB (ref 8.9–10.3)
CO2: 21 mmol/L — ABNORMAL LOW (ref 22–32)
Chloride: 97 mmol/L — ABNORMAL LOW (ref 101–111)
Creatinine, Ser: 0.46 mg/dL (ref 0.44–1.00)
GFR calc Af Amer: >60 mL/min (ref >60)
Glucose, Bld: 104 mg/dL — ABNORMAL HIGH (ref 65–99)
POTASSIUM: 3.4 mmol/L — AB (ref 3.5–5.1)
SODIUM: 130 mmol/L — AB (ref 135–145)

## 2016-05-18 LAB — URINE CULTURE
CULTURE: NO GROWTH
Culture: <10000 — AB

## 2016-05-18 LAB — VITAMIN B12: Vitamin B-12: 192 pg/mL (ref 180–914)

## 2016-05-18 LAB — TSH: TSH: 1.941 u[IU]/mL (ref 0.350–4.500)

## 2016-05-18 MED ORDER — ADULT MULTIVITAMIN W/MINERALS CH
1.0000 | ORAL_TABLET | Freq: Every day | ORAL | Status: DC
  Administered 2016-05-19 – 2016-05-20 (×2): 1 via ORAL
  Filled 2016-05-18 (×2): qty 1

## 2016-05-18 MED ORDER — BOOST / RESOURCE BREEZE PO LIQD
1.0000 | Freq: Two times a day (BID) | ORAL | Status: DC
  Administered 2016-05-20: 1 via ORAL

## 2016-05-18 MED ORDER — ENSURE ENLIVE PO LIQD: 237.0000 mL | Freq: Two times a day (BID) | ORAL | Status: DC

## 2016-05-18 MED ORDER — POTASSIUM CHLORIDE CRYS ER 20 MEQ PO TBCR
40.0000 meq | EXTENDED_RELEASE_TABLET | Freq: Two times a day (BID) | ORAL | Status: DC
  Administered 2016-05-18 – 2016-05-20 (×5): 40 meq via ORAL
  Filled 2016-05-18 (×5): qty 2

## 2016-05-18 NOTE — Telephone Encounter (Signed)
Call from pt's sister concerned that pt is not receiving pain medicine while in the hospital. She thinks her agitation may be related to pain. Sister reports she was told pt was not to receive pain med unless she asks for it. Per sister, she is unable to verbalize what she needs. She reports pt is having difficulty using bedpan due to pain as well.  Requesting pain med orders to be changed. Will make Dr. Benay Spice aware of request.

## 2016-05-18 NOTE — Progress Notes (Signed)
Initial Nutrition Assessment  DOCUMENTATION CODES:   Severe malnutrition in context of acute illness/injury, Underweight  INTERVENTION:  - Continue Ensure Enlive BID, each supplement provides 350 kcal and 20 grams of protein - Will order Boost Breeze BID, each supplement provides 250 kcal and 9 grams of protein - Will order daily multivitamin with minerals.  - Continue to encourage PO intakes of meals and supplements.  - RD will continue to monitor for additional needs.  NUTRITION DIAGNOSIS:   Increased nutrient needs related to catabolic illness, cancer and cancer related treatments as evidenced by estimated needs.  GOAL:   Patient will meet greater than or equal to 90% of their needs  MONITOR:   PO intake, Supplement acceptance, Weight trends, Labs, Skin, I & O's  REASON FOR ASSESSMENT:   Malnutrition Screening Tool, Consult Assessment of nutrition requirement/status  ASSESSMENT:   66 y.o. female with PMH significant for metastatic small cell lung cancer, DVT (on lovenox), depression, anxiety, chronic anemia and moderate protein calorie malnutrition; who presented to ED with worsening SOB, altered mental status and high grade fever. Family reports patient been very confused and with difficulty to perform basic tasks (usually AAOX4). They also reported anorexia and poor PO intake. No CP, no abd pain, no dysuria, no hematuria, no hematemesis, melena or hematochezia.   Pt seen for MST and consult. BMI indicates underweight status. No family/visitors present at time of RD visit. Notes indicate pt is normally A/Ox4 but came in encephalopathic. Sitter at bedside. Pt denies consuming lunch but sitter reports pt had a few bites of mashed potatoes and 100% of cup of pears. Pt then confirms she had a small amount of lunch. She denies abdominal pain or nausea at this time. Pt is able to state that she used to have a good appetite but that appetite then decreased; she is unable to give any  further details. No other PTA information able to be obtained at this time.   Will maintain Ensure order and also order Boost Breeze as pt seems to like fruit and order multivitamin d/t poor intakes.   Physical assessment shows severe muscle and severe fat wasting, no edema present at this time. Per chart review, pt has lost 12 lbs (11% body weight) in the past 2 months which is significant for time frame. Pt meets criteria for malnutrition in the context of acute illness based on muscle and fat wasting and weight loss.   Medications reviewed; 30 mg IV Lasix x1 dose yesterday, 40 mEq oral KCl BID, 1 tablet Senokot BID.  Labs reviewed; Na: 130 mmol/L, K: 3.4 mmol/L, Cl: 97 mmol/L, Ca: 7.8 mg/dL.  IVF: NS @ 75 mL/hr.     Diet Order:  DIET SOFT Room service appropriate? Yes; Fluid consistency: Thin  Skin:  Wound (see comment) (Stage 1 sacral pressure injury)  Last BM:  1/14  Height:   Ht Readings from Last 1 Encounters:  05/17/16 '5\' 5"'$  (1.651 m)    Weight:   Wt Readings from Last 1 Encounters:  05/17/16 99 lb 3.3 oz (45 kg)    Ideal Body Weight:  56.18 kg  BMI:  Body mass index is 16.51 kg/m.  Estimated Nutritional Needs:   Kcal:  1575-1800 (35-40 kcal/kg)  Protein:  68-81 grams (1.5-1.8 grams/kg)  Fluid:  >/= 1.8 L/day  EDUCATION NEEDS:   No education needs identified at this time    Jarome Matin, MS, RD, LDN, CNSC Inpatient Clinical Dietitian Pager # [phone] After hours/weekend pager # [phone]

## 2016-05-18 NOTE — Evaluation (Signed)
Physical Therapy Evaluation Patient Details Name: Jenny [name] MRN: [id number] DOB: 1951/03/27 Today's Date: 05/18/2016   History of Present Illness   66 y.o. female with PMH significant for metastatic small cell lung cancer, DVT (on lovenox), depression, anxiety, chronic anemia and moderate protein calorie malnutrition; who presented to ED with worsening SOB, altered mental status and high grade fever.  Clinical Impression  Pt admitted with above diagnosis. Pt currently with functional limitations due to the deficits listed below (see PT Problem List). Min A for bed to recliner transfer, pt impulsive and has decreased safety awareness. HR up to 144 with transfers so gait deferred. May need ST-SNF depending on progress.  Pt will benefit from skilled PT to increase their independence and safety with mobility to allow discharge to the venue listed below.       Follow Up Recommendations SNF;Supervision/Assistance - 24 hour    Equipment Recommendations  Rolling walker with 5" wheels    Recommendations for Other Services       Precautions / Restrictions Precautions Precautions: Fall Precaution Comments: no falls in past 1 year Restrictions Weight Bearing Restrictions: No      Mobility  Bed Mobility Overal bed mobility: Needs Assistance Bed Mobility: Supine to Sit     Supine to sit: Min assist     General bed mobility comments: pt frequently changing positions betweeen supine and sit, restless, pulling at EKG wires, needs assist for safety and to manage lines  Transfers Overall transfer level: Needs assistance Equipment used: None Transfers: Sit to/from Stand;Stand Pivot Transfers Sit to Stand: Min assist Stand pivot transfers: Min assist       General transfer comment: min A for balance, transferred bed to recliner then back to bed, HR up to 144 with activity so deferred ambulation  Ambulation/Gait                Stairs            Wheelchair Mobility     Modified Rankin (Stroke Patients Only)       Balance Overall balance assessment: Needs assistance   Sitting balance-Leahy Scale: Fair       Standing balance-Leahy Scale: Poor Standing balance comment: requires min A for standing balance, LOB in multiple directions                             Pertinent Vitals/Pain Pain Assessment: No/denies pain    Home Living Family/patient expects to be discharged to:: Private residence Living Arrangements: Spouse/significant other Available Help at Discharge: Friend(s);Family;Available 24 hours/day         Home Layout: Two level;Bed/bath upstairs Home Equipment: None      Prior Function Level of Independence: Independent         Comments: walked without AD short distances, independent ADLs     Hand Dominance        Extremity/Trunk Assessment   Upper Extremity Assessment Upper Extremity Assessment: Defer to OT evaluation    Lower Extremity Assessment Lower Extremity Assessment: Generalized weakness (knee ext -4/5 B)    Cervical / Trunk Assessment Cervical / Trunk Assessment: Kyphotic  Communication   Communication: No difficulties  Cognition Arousal/Alertness: Awake/alert Behavior During Therapy: Restless;Impulsive Overall Cognitive Status: Impaired/Different from baseline Area of Impairment: Safety/judgement;Awareness         Safety/Judgement: Decreased awareness of safety;Decreased awareness of deficits     General Comments: pt oriented, impulsive and restless, frequently getting in/out of bed  General Comments      Exercises     Assessment/Plan    PT Assessment Patient needs continued PT services  PT Problem List Decreased balance;Decreased strength;Decreased mobility;Decreased activity tolerance;Decreased safety awareness;Cardiopulmonary status limiting activity          PT Treatment Interventions Gait training;DME instruction;Stair training;Functional mobility training;Balance  training;Therapeutic exercise;Therapeutic activities;Patient/family education    PT Goals (Current goals can be found in the Care Plan section)  Acute Rehab PT Goals Patient Stated Goal: return home PT Goal Formulation: With patient/family Time For Goal Achievement: 06/01/16 Potential to Achieve Goals: Fair    Frequency Min 3X/week   Barriers to discharge   husband works in [place] during day, pt's siblings have stayed with her during day but pt's needs have increased with this hospitalization, pt's husband unsure if they can manage at home in present condition    Co-evaluation               End of Session   Activity Tolerance: Treatment limited secondary to medical complications (Comment) (HR 144 with bed to chair transfer) Patient left: in bed;with bed alarm set;with family/visitor present;with nursing/sitter in room;with call bell/phone within reach Nurse Communication: Mobility status         Time: 4715-9539 PT Time Calculation (min) (ACUTE ONLY): 20 min   Charges:   PT Evaluation $PT Eval Moderate Complexity: 1 Procedure     PT G CodesPhilomena Doheny 05/18/2016, 11:07 AM [phone]

## 2016-05-18 NOTE — Progress Notes (Signed)
PROGRESS NOTE    Jenny [name]  IWO:[id number] DOB: 09-14-1950 DOA: 05/17/2016 PCP: Eulas Post, MD    Brief Narrative:  Jenny [name] is a 66 y.o. female with PMH significant for metastatic small cell lung cancer, DVT (on lovenox), depression, anxiety, chronic anemia and moderate protein calorie malnutrition; who presented to ED with worsening SOB, altered mental status and high grade fever. Family reports patient been very confused and with difficulty to perform basic tasks (usually AAOX4). They also reported anorexia and poor PO intake. No CP, no abd pain, no dysuria, no hematuria, no hematemesis, melena or hematochezia.  Of note; patient is actively receiving chemotherapy and most recent treatment was on 05/13/16.  ED Course: patient was placed on [place] supplementation, CXR suggesting worsening LLL PNA (obstructive vs HCAP in nature); blood work demonstrated anemia with Hgb 6.9. Patient's cultures take, started on broad spectrum antibiotics and sepsis protocol initiated. Patient met criteria with fever up to 102 at home, tachycardia, elevated RR, abnormal CXR for source and encephalopathy   Assessment & Plan:   Principal Problem:   Encephalopathy Active Problems:   DVT, lower extremity, recurrent (HCC)   COPD (chronic obstructive pulmonary disease) (HCC)   Small cell lung cancer (HCC)   Malnutrition of moderate degree   Anemia associated with chemotherapy   HCAP (healthcare-associated pneumonia)   Dehydration with hyponatremia   Pressure injury of skin   Sepsis: due to HCAP vs Flu -patient met sepsis criteria on admission with ongoing high temp (102) while at home, tachycardia, abnormal CXR and acute encephalopathy (associated with infection process) -given active chemotherapy, will treat for HCAP -started on vanc and zosyn - flu PCR and respiratory panel pending - continue oxygen supplementation, nebulizer treatment, pulmicort, and flutter valve -patient started on mucinex  BID -will follow clinical response and weaned oxygen supplementation as tolerated - blood cultures pending - procalcitonin of 0.39 - urine strep pneumo antigen negative - legionella antigen pending - urine culture pending -lactic acid was normal  Encephalopathy: most likely toxic and due to sepsis -will treat with antibiotics and supportive care as mentioned above -low Hgb and hyponatremia contributing - transfused 2 units of PRBC's - sodium of 130 today (improved from 127) and will replete/follow electrolytes  - TSH WNL - B12, RPR pending - CT head pending  DVT, lower extremity, recurrent (HCC) -continue daily lovenox shot  COPD (chronic obstructive pulmonary disease) (HCC) -patient with slight wheezing -will use Duoneb as needed and pulmicort  Small cell lung cancer (HCC) - Dr. Benay Spice contacted and made aware of admission - appreciate Dr. Benay Spice recommendations  Malnutrition of moderate degree -nutritional service consulted -will use ensure BID and follow rec's for supplementation  -Body mass index is 16.31 kg/m.   Anemia associated with chemotherapy -Hgb 6.9 -patient pale and with associated symptoms -will transfuse 2 units of PRBC's -will follow Hgb trend   Dehydration with hyponatremia  -will provide gentle IVF's resuscitation and will follow electrolytes - possible differential includes SIADH from lung malignancy but no signs of fluid overload on exam  Depression and anxiety  -will continue PRN alprazolam and continue cymbalta     DVT prophylaxis: on chronic lovenox.  Code Status: Full Family Communication: Son at bedside   Disposition Plan: to be determined; currently unclear. But will hope for discharge home once medically stable (breathing stabilized, mentation back to baseline and patient capable of caring/helping taken care of herself.    Consultants:   Oncology- Dr. Benay Spice  Procedures:  None  Antimicrobials:    Vancomycin  Zosyn    Subjective: Patient laying in bed.  She is oriented to self and place but does not know why she is in the hospital.  Impulsive throughout exam.  States she is hungry.  CT of head is still pending.  Objective: Vitals:   05/18/16 0200 05/18/16 0400 05/18/16 0600 05/18/16 0800  BP: 115/65 (!) 94/57 (!) 89/56 118/68  Pulse: (!) 107 89 (!) 104 (!) 102  Resp: _0 Temp:      TempSrc:      SpO2: 99% 100% 99% 97%  Weight:      Height:        Intake/Output Summary (Last 24 hours) at 05/18/16 0842 Last data filed at 05/18/16 0800  Gross per 24 hour  Intake           5132.5 ml  Output             3801 ml  Net           1331.5 ml   Filed Weights   05/17/16 0747 05/17/16 1300  Weight: 43.1 kg (95 lb) 45 kg (99 lb 3.3 oz)    Examination:  General exam: Appears calm and comfortable  Respiratory system: Few rales at bases, Respiratory effort normal. Cardiovascular system: S1 & S2 heard, RRR. No JVD, murmurs, rubs, gallops or clicks. No pedal edema. Gastrointestinal system: Abdomen is nondistended, soft and nontender. No organomegaly or masses felt. Normal bowel sounds heard. Central nervous system: Alert and oriented to self and place. No focal neurological deficits. Extremities: Symmetric 5 x 5 power. Skin: No rashes, lesions or ulcers, skin is pale Psychiatry: Mood appropriate. Patient impulsive    Data Reviewed: I have personally reviewed following labs and imaging studies  CBC:  Recent Labs Lab 05/11/16 1248 05/17/16 0745 05/18/16 0327  WBC 14.1* 6.9 5.2  NEUTROABS 12.7* 6.6  --   HGB 8.4* 6.9* 9.4*  HCT 28.1* 21.7* 29.0*  MCV 74.0* 73.1* 75.5*  PLT 352 185 212*   Basic Metabolic Panel:  Recent Labs Lab 05/11/16 1248 05/17/16 0745 05/18/16 0327  NA 131* 127* 130*  K 4.5 4.2 3.4*  CL  --  97* 97*  CO2 21* 21* 21*  GLUCOSE 142* 108* 104*  BUN 13.0 11 10  CREATININE 0.7 0.58 0.46  CALCIUM 8.8 7.9* 7.8*   GFR: Estimated  Creatinine Clearance: 51.1 mL/min (by C-G formula based on SCr of 0.46 mg/dL). Liver Function Tests:  Recent Labs Lab 05/11/16 1248 05/17/16 0745  AST 17 25  ALT 9 10*  ALKPHOS 162* 100  BILITOT 0.40 0.4  PROT 6.8 5.6*  ALBUMIN 2.2* 2.3*   No results for input(s): LIPASE, AMYLASE in the last 168 hours. No results for input(s): AMMONIA in the last 168 hours. Coagulation Profile: No results for input(s): INR, PROTIME in the last 168 hours. Cardiac Enzymes: No results for input(s): CKTOTAL, CKMB, CKMBINDEX, TROPONINI in the last 168 hours. BNP (last 3 results) No results for input(s): PROBNP in the last 8760 hours. HbA1C: No results for input(s): HGBA1C in the last 72 hours. CBG: No results for input(s): GLUCAP in the last 168 hours. Lipid Profile: No results for input(s): CHOL, HDL, LDLCALC, TRIG, CHOLHDL, LDLDIRECT in the last 72 hours. Thyroid Function Tests:  Recent Labs  05/18/16 0327  TSH 1.941   Anemia Panel: No results for input(s): VITAMINB12, FOLATE, FERRITIN, TIBC, IRON, RETICCTPCT in the last 72 hours. Sepsis Labs:  Recent Labs Lab 05/17/16 0745 05/17/16 0757 05/17/16 1055  PROCALCITON 0.39  --   --   LATICACIDVEN  --  0.98 0.82    Recent Results (from the past 240 hour(s))  MRSA PCR Screening     Status: None   Collection Time: 05/17/16  1:05 PM  Result Value Ref Range Status   MRSA by PCR NEGATIVE NEGATIVE Final    Comment:        The GeneXpert MRSA Assay (FDA approved for NASAL specimens only), is one component of a comprehensive MRSA colonization surveillance program. It is not intended to diagnose MRSA infection nor to guide or monitor treatment for MRSA infections.          Radiology Studies: Dg Chest Port 1 View  Result Date: 05/17/2016 CLINICAL DATA:  Fever and disorientation x 2 days; patient is undergoing chemo for lung CA EXAM: PORTABLE CHEST 1 VIEW COMPARISON:  01/30/2016 FINDINGS: Midline trachea. Patient minimally rotated  left. Mild cardiomegaly. Probable small left pleural effusion. Clear right lung. Worsened left base aeration. Suspicion of left suprahilar soft tissue fullness, new since the prior exam. IMPRESSION: Worsened left lower lobe aeration with development of pleural fluid and increased airspace disease. Given the appearance on prior CT, this airspace disease could represent infection, postobstructive pneumonitis, or could be treatment related. Left suprahilar soft tissue fullness could represent progressive adenopathy or a focus of infection. Electronically Signed   By: Abigail Miyamoto M.D.   On: 05/17/2016 09:29        Scheduled Meds: . sodium chloride   Intravenous Once  . budesonide (PULMICORT) nebulizer solution  0.25 mg Nebulization BID  . DULoxetine  30 mg Oral Daily  . enoxaparin  1.5 mg/kg Subcutaneous Q24H  . feeding supplement (ENSURE ENLIVE)  237 mL Oral BID BM  . guaiFENesin  600 mg Oral BID  . oseltamivir  30 mg Oral BID  . piperacillin-tazobactam (ZOSYN)  IV  3.375 g Intravenous Q8H  . senna-docusate  1 tablet Oral BID  . vancomycin  1,000 mg Intravenous Q24H   Continuous Infusions: . sodium chloride 75 mL/hr at 05/18/16 0800     LOS: 1 day    Time spent: 30 minutes    Loretha Stapler, MD [facility] Pager [phone]  If 7PM-7AM, please contact night-coverage [email or web address] Password [facility] 05/18/2016, 8:42 AM

## 2016-05-18 NOTE — Progress Notes (Signed)
IP PROGRESS NOTE  Subjective:   Jenny [name] is known to me with a history of extensive stage small cell lung cancer. She had recent progression of tumor in the liver and bones. She completed a first cycle of salvage therapy with etoposide/carboplatin beginning 05/11/2016. She did not receive Neulasta support. She presented to the emergency room yesterday with fever, dyspnea, and altered mental status. She denies pain this morning.  Objective: Vital signs in last 24 hours: Blood pressure 118/68, pulse 97, temperature 98.6 F (37 C), resp. rate 16, height '5\' 5"'$  (1.651 m), weight 99 lb 3.3 oz (45 kg), SpO2 95 %.  Intake/Output from previous day: 01/14 0701 - 01/15 0700 In: 5357.5 [P.O.:310; I.V.:2367.5; Blood:780; IV Piggyback:1900] Out: 3801 [Urine:3800; Stool:1]  Physical Exam:  HEENT: No thrush Lungs: Bronchial sounds at the left lower posterior chest, no respiratory distress Cardiac: Regular rate and rhythm Abdomen: No hepatomegaly, nontender Extremities: No leg edema Neurologic: Alert, moves all extremities to command. Not oriented.   Lab Results:  Recent Labs  05/17/16 0745 05/18/16 0327  WBC 6.9 5.2  HGB 6.9* 9.4*  HCT 21.7* 29.0*  PLT 185 140*    BMET  Recent Labs  05/17/16 0745 05/18/16 0327  NA 127* 130*  K 4.2 3.4*  CL 97* 97*  CO2 21* 21*  GLUCOSE 108* 104*  BUN 11 10  CREATININE 0.58 0.46  CALCIUM 7.9* 7.8*    Studies/Results: Dg Chest Port 1 View  Result Date: 05/17/2016 CLINICAL DATA:  Fever and disorientation x 2 days; patient is undergoing chemo for lung CA EXAM: PORTABLE CHEST 1 VIEW COMPARISON:  01/30/2016 FINDINGS: Midline trachea. Patient minimally rotated left. Mild cardiomegaly. Probable small left pleural effusion. Clear right lung. Worsened left base aeration. Suspicion of left suprahilar soft tissue fullness, new since the prior exam. IMPRESSION: Worsened left lower lobe aeration with development of pleural fluid and increased airspace  disease. Given the appearance on prior CT, this airspace disease could represent infection, postobstructive pneumonitis, or could be treatment related. Left suprahilar soft tissue fullness could represent progressive adenopathy or a focus of infection. Electronically Signed   By: Abigail Miyamoto M.D.   On: 05/17/2016 09:29    Medications: I have reviewed the patient's current medications.  Assessment/Plan: 1. Limited stage small cell lung cancer, diagnosed on biopsy of a subcarinal lymph node and left lung mass by EBUSon 09/12/2015 ? PET scan 09/03/2015 confirmed a hypermetabolic left perihilar mass, 2 left lower lobe pulmonary masses, and mediastinal lymphadenopathy, no evidence of distant metastatic disease ? Brain MRI negative for metastatic disease 09/13/2015 ? Status post chest radiation 09/26/2015 through 10/18/2015  ? Cycle 1 cisplatin/etoposide beginning 09/26/2015 ? Cycle 2 cisplatin/etoposide beginning 10/16/2015 ? Cycle 3 carboplatin/etoposide beginning 11/06/2015 ? 12/17/2015 MRI of the brain negative ? 12/17/2015 CT scans chest/abdomen/pelvis (report currently not available) ? Bone scan 12/27/2015 with minimal increased uptake in the posterior aspect of the right 11th rib that does not appear abnormal on the CT images of August 2017; sclerotic focus noted on the previous CT scan in the body of L1 showed no abnormal uptake on the current bone scan; asymmetric increased uptake in the left SI joint region possibly corresponding to a subtle sclerotic focus in the medial posterior aspect of the left iliac bone ? CT chest 04/07/2016-new liver metastases, new upper abdominal adenopathy, left iliac metastasis, new right posterior lateral  Pleural/chest wall metastasis ? Cycle 1 salvage chemotherapy with etoposide/carboplatin 05/11/2016  2. 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 Weight loss secondary to #2  4. History of febrile  neutropenia following chemotherapy  5. Remote history of a right leg DVT-2008?-Maintained on Lovenox  6. History of tobacco use  7. Admission to [facility] 10/27/2015 with neutropenic fever, esophagitis, and dehydration  8. Hypercalcemia 05/01/2016-status post intravenous fluids and Zometa12/29/2017  9.   Fever prior to hospital admission 05/17/2016-infectious versus tumor fever  10.  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  Altered mental status-potentially related to infection/delirium, concern for brain metastases   Jenny [name] has extensive stage small cell lung cancer. She is now at day 8 following a first cycle of salvage chemotherapy with etoposide/carboplatin. She is admitted with altered mental status and a fever. The chest x-ray suggests the possibility of pneumonia, but the x-ray findings could be related to chest radiation and tumor. She has a high risk of developing brain metastases.  Recommendations: 1. Continue antibiotics and follow-up cultures 2. Check CBC with differential on 05/19/2016 3. CT brain if mental status not improved on 05/19/2016        LOS: 1 day   Betsy Coder, MD   05/18/2016, 9:12 AM

## 2016-05-19 ENCOUNTER — Other Ambulatory Visit: Payer: Self-pay

## 2016-05-19 ENCOUNTER — Inpatient Hospital Stay (HOSPITAL_COMMUNITY): Payer: BLUE CROSS/BLUE SHIELD

## 2016-05-19 ENCOUNTER — Ambulatory Visit: Payer: Self-pay | Admitting: Nurse Practitioner

## 2016-05-19 DIAGNOSIS — E43 Unspecified severe protein-calorie malnutrition: Secondary | ICD-10-CM | POA: Insufficient documentation

## 2016-05-19 DIAGNOSIS — R404 Transient alteration of awareness: Secondary | ICD-10-CM

## 2016-05-19 DIAGNOSIS — G9340 Encephalopathy, unspecified: Secondary | ICD-10-CM

## 2016-05-19 DIAGNOSIS — R4182 Altered mental status, unspecified: Secondary | ICD-10-CM

## 2016-05-19 DIAGNOSIS — J181 Lobar pneumonia, unspecified organism: Secondary | ICD-10-CM

## 2016-05-19 LAB — CBC
HEMATOCRIT: 29 % — AB (ref 36.0–46.0)
HEMOGLOBIN: 9.4 g/dL — AB (ref 12.0–15.0)
MCH: 24.5 pg — ABNORMAL LOW (ref 26.0–34.0)
MCHC: 32.4 g/dL (ref 30.0–36.0)
MCV: 75.5 fL — ABNORMAL LOW (ref 78.0–100.0)
Platelets: 140 10*3/uL — ABNORMAL LOW (ref 150–400)
RBC: 3.84 MIL/uL — ABNORMAL LOW (ref 3.87–5.11)
RDW: 16.8 % — ABNORMAL HIGH (ref 11.5–15.5)
WBC: 5.2 10*3/uL (ref 4.0–10.5)

## 2016-05-19 LAB — BASIC METABOLIC PANEL
ANION GAP: 5 (ref 5–15)
BUN: 9 mg/dL (ref 6–20)
CHLORIDE: 106 mmol/L (ref 101–111)
CO2: 22 mmol/L (ref 22–32)
Calcium: 7.7 mg/dL — ABNORMAL LOW (ref 8.9–10.3)
Creatinine, Ser: 0.49 mg/dL (ref 0.44–1.00)
GFR calc non Af Amer: >60 mL/min (ref >60)
Glucose, Bld: 120 mg/dL — ABNORMAL HIGH (ref 65–99)
POTASSIUM: 4.4 mmol/L (ref 3.5–5.1)
Sodium: 133 mmol/L — ABNORMAL LOW (ref 135–145)

## 2016-05-19 LAB — MAGNESIUM: MAGNESIUM: 1.7 mg/dL (ref 1.7–2.4)

## 2016-05-19 LAB — CBC WITH DIFFERENTIAL/PLATELET
Basophils Absolute: 0 10*3/uL (ref 0.0–0.1)
Basophils Relative: 0 %
EOS ABS: 0.1 10*3/uL (ref 0.0–0.7)
Eosinophils Relative: 2 %
HCT: 26.2 % — ABNORMAL LOW (ref 36.0–46.0)
HEMOGLOBIN: 8.3 g/dL — AB (ref 12.0–15.0)
Lymphocytes Relative: 8 %
Lymphs Abs: 0.2 10*3/uL — ABNORMAL LOW (ref 0.7–4.0)
MCH: 23.9 pg — ABNORMAL LOW (ref 26.0–34.0)
MCHC: 31.7 g/dL (ref 30.0–36.0)
MCV: 75.3 fL — ABNORMAL LOW (ref 78.0–100.0)
Monocytes Absolute: 0.1 10*3/uL (ref 0.1–1.0)
Monocytes Relative: 3 %
NEUTROS PCT: 87 %
Neutro Abs: 2.1 10*3/uL (ref 1.7–7.7)
Platelets: 102 10*3/uL — ABNORMAL LOW (ref 150–400)
RBC: 3.48 MIL/uL — ABNORMAL LOW (ref 3.87–5.11)
RDW: 16.8 % — ABNORMAL HIGH (ref 11.5–15.5)
WBC: 2.4 10*3/uL — ABNORMAL LOW (ref 4.0–10.5)

## 2016-05-19 LAB — PROCALCITONIN: Procalcitonin: 0.3 ng/mL

## 2016-05-19 LAB — HIV ANTIBODY (ROUTINE TESTING W REFLEX): HIV Screen 4th Generation wRfx: NONREACTIVE

## 2016-05-19 LAB — SYPHILIS: RPR W/REFLEX TO RPR TITER AND TREPONEMAL ANTIBODIES, TRADITIONAL SCREENING AND DIAGNOSIS ALGORITHM: RPR Ser Ql: NONREACTIVE

## 2016-05-19 MED ORDER — ALPRAZOLAM 0.25 MG PO TABS
0.2500 mg | ORAL_TABLET | Freq: Three times a day (TID) | ORAL | Status: DC | PRN
  Administered 2016-05-20: 0.25 mg via ORAL
  Filled 2016-05-19: qty 1

## 2016-05-19 MED ORDER — IOPAMIDOL (ISOVUE-300) INJECTION 61%
INTRAVENOUS | Status: AC
  Filled 2016-05-19: qty 100

## 2016-05-19 MED ORDER — IOPAMIDOL (ISOVUE-300) INJECTION 61%
100.0000 mL | Freq: Once | INTRAVENOUS | Status: AC | PRN
  Administered 2016-05-19: 100 mL via INTRAVENOUS

## 2016-05-19 NOTE — Progress Notes (Signed)
CN from 4th floor called this RN back stating she had a safety sitter lined up but now they have given the bed to an ER patient. Dr Adair Patter called and order placed again to transfer patient to tele floor

## 2016-05-19 NOTE — Progress Notes (Signed)
PROGRESS NOTE    Jenny [name]  TDD:[id number] DOB: 03/07/1951 DOA: 05/17/2016 PCP: Eulas Post, MD    Brief Narrative:  Jenny [name] is a 66 y.o. female with PMH significant for metastatic small cell lung cancer, DVT (on lovenox), depression, anxiety, chronic anemia and moderate protein calorie malnutrition; who presented to ED with worsening SOB, altered mental status and high grade fever. Family reports patient been very confused and with difficulty to perform basic tasks (usually AAOX4). They also reported anorexia and poor PO intake. No CP, no abd pain, no dysuria, no hematuria, no hematemesis, melena or hematochezia.  Of note; patient is actively receiving chemotherapy and most recent treatment was on 05/13/16.  ED Course: patient was placed on [place] supplementation, CXR suggesting worsening LLL PNA (obstructive vs HCAP in nature); blood work demonstrated anemia with Hgb 6.9. Patient's cultures take, started on broad spectrum antibiotics and sepsis protocol initiated. Patient met criteria with fever up to 102 at home, tachycardia, elevated RR, abnormal CXR for source and encephalopathy   Assessment & Plan:   Principal Problem:   Encephalopathy Active Problems:   DVT, lower extremity, recurrent (HCC)   COPD (chronic obstructive pulmonary disease) (HCC)   Small cell lung cancer (HCC)   Malnutrition of moderate degree   Anemia associated with chemotherapy   HCAP (healthcare-associated pneumonia)   Dehydration with hyponatremia   Pressure injury of skin   Protein-calorie malnutrition, severe   Sepsis: due to HCAP vs Flu -patient met sepsis criteria on admission with ongoing high temp (102) while at home, tachycardia, abnormal CXR and acute encephalopathy (associated with infection process) -given active chemotherapy, will treat for HCAP -started on vanc and zosyn - flu PCR negative - respiratory panel positive for coronavirus - continue oxygen supplementation, nebulizer  treatment, pulmicort, and flutter valve -patient started on mucinex BID -will follow clinical response and weaned oxygen supplementation as tolerated - blood cultures: NG x 1 day - procalcitonin of 0.39 - urine strep pneumo antigen negative - legionella antigen pending - urine culture showing insufficient growth -lactic acid was normal  Encephalopathy: most likely toxic and due to sepsis -will treat with antibiotics and supportive care as mentioned above -low Hgb and hyponatremia contributing - transfused 2 units of PRBC's - sodium of 130 yesterdday (improved from 127) and will replete/follow electrolytes  - TSH WNL - B12 WNL - CT head: Multiple skull metastases with epidural extension as above, with the largest in the midline frontal bone and compressing or partially invading the superior sagittal sinus, which remains patent. This metastasis has mildly enlarged from the prior MRI.  DVT, lower extremity, recurrent ([place]) -continue daily lovenox shot  COPD (chronic obstructive pulmonary disease) ([place]) -patient with slight wheezing -will use Duoneb as needed and pulmicort  Small cell lung cancer ([place]) - Dr. Benay Spice contacted and made aware of admission - appreciate Dr. Benay Spice recommendations  Malnutrition of moderate degree -nutritional service consulted -will use ensure BID and follow rec's for supplementation  -Body mass index is 16.31 kg/m.   Anemia associated with chemotherapy -Hgb 6.9 -patient pale and with associated symptoms - transfused 2 units of PRBC's - Hgb lower this am - will continue to follow  Dehydration with hyponatremia  -will provide gentle IVF's resuscitation and will follow electrolytes - possible differential includes SIADH from lung malignancy but no signs of fluid overload on exam - repeat BMP pending  Depression and anxiety  -will continue PRN alprazolam and continue cymbalta - increase frequency of alprazolam but decrease  dose to  0.51m     DVT prophylaxis: on chronic lovenox.  Code Status: Full Family Communication: Sister at bedside Disposition Plan: to be determined; currently unclear. But will hope for discharge home once medically stable (breathing stabilized, mentation back to baseline and patient capable of caring/helping taken care of herself.    Consultants:   Oncology- Dr. SBenay Spice Procedures:   None  Antimicrobials:   Vancomycin  Zosyn    Subjective: Patient laying in bed.  She is oriented to self and situation.  She remains impulsive and pulled her IV out during interview.  She is still confused and not at baseline.  CT head to be done today.  Objective: Vitals:   05/19/16 1007 05/19/16 1026 05/19/16 1200 05/19/16 1252  BP:  (!) 104/59  111/64  Pulse:  (!) 105  (!) 105  Resp:  17  (!) 23  Temp:   98.3 F (36.8 C)   TempSrc:   Oral   SpO2: 100% (!) 85%    Weight:      Height:        Intake/Output Summary (Last 24 hours) at 05/19/16 1430 Last data filed at 05/19/16 0800  Gross per 24 hour  Intake             2130 ml  Output              600 ml  Net             1530 ml   Filed Weights   05/17/16 0747 05/17/16 1300  Weight: 43.1 kg (95 lb) 45 kg (99 lb 3.3 oz)    Examination:  General exam: Appears calm and comfortable  Respiratory system: Few rales at bases, Respiratory effort normal. Cardiovascular system: S1 & S2 heard, RRR. No JVD, murmurs, rubs, gallops or clicks. No pedal edema. Gastrointestinal system: Abdomen is nondistended, soft and nontender. No organomegaly or masses felt. Normal bowel sounds heard. Central nervous system: Alert and oriented to self and situation but still confused. No focal neurological deficits. Extremities: weak, attempts to get out of bed frequently Skin: No rashes, lesions or ulcers, skin is pale Psychiatry: Mood appropriate. Patient impulsive    Data Reviewed: I have personally reviewed following labs and imaging  studies  CBC:  Recent Labs Lab 05/17/16 0745 05/18/16 0327 05/19/16 0356  WBC 6.9 5.2 2.4*  NEUTROABS 6.6  --  2.1  HGB 6.9* 9.4* 8.3*  HCT 21.7* 29.0* 26.2*  MCV 73.1* 75.5* 75.3*  PLT 185 140* 1614   Basic Metabolic Panel:  Recent Labs Lab 05/17/16 0745 05/18/16 0327  NA 127* 130*  K 4.2 3.4*  CL 97* 97*  CO2 21* 21*  GLUCOSE 108* 104*  BUN 11 10  CREATININE 0.58 0.46  CALCIUM 7.9* 7.8*   GFR: Estimated Creatinine Clearance: 51.1 mL/min (by C-G formula based on SCr of 0.46 mg/dL). Liver Function Tests:  Recent Labs Lab 05/17/16 0745  AST 25  ALT 10*  ALKPHOS 100  BILITOT 0.4  PROT 5.6*  ALBUMIN 2.3*   No results for input(s): LIPASE, AMYLASE in the last 168 hours. No results for input(s): AMMONIA in the last 168 hours. Coagulation Profile: No results for input(s): INR, PROTIME in the last 168 hours. Cardiac Enzymes: No results for input(s): CKTOTAL, CKMB, CKMBINDEX, TROPONINI in the last 168 hours. BNP (last 3 results) No results for input(s): PROBNP in the last 8760 hours. HbA1C: No results for input(s): HGBA1C in the last 72 hours. CBG: No  results for input(s): GLUCAP in the last 168 hours. Lipid Profile: No results for input(s): CHOL, HDL, LDLCALC, TRIG, CHOLHDL, LDLDIRECT in the last 72 hours. Thyroid Function Tests:  Recent Labs  05/18/16 0327  TSH 1.941   Anemia Panel:  Recent Labs  05/18/16 0327  VITAMINB12 192   Sepsis Labs:  Recent Labs Lab 05/17/16 0745 05/17/16 0757 05/17/16 1055 05/19/16 0356  PROCALCITON 0.39  --   --  0.30  LATICACIDVEN  --  0.98 0.82  --     Recent Results (from the past 240 hour(s))  Blood Culture (routine x 2)     Status: None (Preliminary result)   Collection Time: 05/17/16  7:45 AM  Result Value Ref Range Status   Specimen Description BLOOD LEFT HAND  Final   Special Requests IN PEDIATRIC BOTTLE [place]  Final   Culture   Final    NO GROWTH 1 DAY Performed at [facility]    Report  Status PENDING  Incomplete  Blood Culture (routine x 2)     Status: None (Preliminary result)   Collection Time: 05/17/16  7:45 AM  Result Value Ref Range Status   Specimen Description BLOOD LAC  Final   Special Requests BOTTLES DRAWN AEROBIC AND ANAEROBIC 5CC  Final   Culture   Final    NO GROWTH 1 DAY Performed at [facility]    Report Status PENDING  Incomplete  Urine culture     Status: None   Collection Time: 05/17/16  9:33 AM  Result Value Ref Range Status   Specimen Description URINE, CATHETERIZED  Final   Special Requests NONE  Final   Culture NO GROWTH Performed at [facility]   Final   Report Status 05/18/2016 FINAL  Final  Urine culture     Status: Abnormal   Collection Time: 05/17/16 12:41 PM  Result Value Ref Range Status   Specimen Description URINE, RANDOM  Final   Culture (A)  Final    <10,000 COLONIES/mL INSIGNIFICANT GROWTH Performed at [facility]    Report Status 05/18/2016 FINAL  Final  Respiratory Panel by PCR     Status: Abnormal   Collection Time: 05/17/16  1:05 PM  Result Value Ref Range Status   Adenovirus NOT DETECTED NOT DETECTED Final   Coronavirus 229E NOT DETECTED NOT DETECTED Final   Coronavirus HKU1 NOT DETECTED NOT DETECTED Final   Coronavirus NL63 DETECTED (A) NOT DETECTED Final   Coronavirus OC43 NOT DETECTED NOT DETECTED Final   Metapneumovirus NOT DETECTED NOT DETECTED Final   Rhinovirus / Enterovirus NOT DETECTED NOT DETECTED Final   Influenza A NOT DETECTED NOT DETECTED Final   Influenza B NOT DETECTED NOT DETECTED Final   Parainfluenza Virus 1 NOT DETECTED NOT DETECTED Final   Parainfluenza Virus 2 NOT DETECTED NOT DETECTED Final   Parainfluenza Virus 3 NOT DETECTED NOT DETECTED Final   Parainfluenza Virus 4 NOT DETECTED NOT DETECTED Final   Respiratory Syncytial Virus NOT DETECTED NOT DETECTED Final   Bordetella pertussis NOT DETECTED NOT DETECTED Final   Chlamydophila pneumoniae NOT DETECTED NOT DETECTED  Final   Mycoplasma pneumoniae NOT DETECTED NOT DETECTED Final    Comment: Performed at [facility]  MRSA PCR Screening     Status: None   Collection Time: 05/17/16  1:05 PM  Result Value Ref Range Status   MRSA by PCR NEGATIVE NEGATIVE Final    Comment:        The GeneXpert MRSA Assay (  FDA approved for NASAL specimens only), is one component of a comprehensive MRSA colonization surveillance program. It is not intended to diagnose MRSA infection nor to guide or monitor treatment for MRSA infections.          Radiology Studies: Ct Head W & Wo Contrast  Result Date: 05/19/2016 CLINICAL DATA:  Altered mental status. History of extensive stage small-cell lung cancer. EXAM: CT HEAD WITHOUT AND WITH CONTRAST TECHNIQUE: Contiguous axial images were obtained from the base of the skull through the vertex without and with intravenous contrast CONTRAST:  128m ISOVUE-300 IOPAMIDOL (ISOVUE-300) INJECTION 61% COMPAR[facility]  [facility] Brain: There is no evidence of acute cortical infarct, intracranial hemorrhage, intra-axial mass, midline shift, or extra-axial fluid collection. The ventricles and sulci are normal. No enhancing parenchymal brain lesions are identified. A partially empty sella is again seen. Vascular: Minimal calcified at sclerosis at the skullbase. No hyperdense vessel. Skull: There are multiple osseous metastases as demonstrated on the prior outside MRI. The largest involves the frontoparietal skull in the midline and demonstrates extension both into the scalp soft tissues as well as extension intracranially the volume of extraosseous tumor at this location overall appears mildly increased from the prior MRI. Tumor extends over approximately a 9 cm length intracranially, compressing and inferiorly displacing the superior sagittal sinus with invasion not excluded. The sinus remains patent. A smaller lesion involves the right frontal skull laterally  with extension into the scalp soft tissues and as well as mild epidural extension. Additional similar lesions are present involving the parietal bone bilaterally with small amount of epidural extension. Sinuses/Orbits: Small right sphenoid sinus mucous retention cyst. Visualized mastoid air cells are clear. Orbits are unremarkable. Other: None. IMPRESSION: 1. No evidence of acute infarct, intracranial hemorrhage, or parenchymal brain metastases. 2. Multiple skull metastases with epidural extension as above, with the largest in the midline frontal bone and compressing or partially invading the superior sagittal sinus, which remains patent. This metastasis has mildly enlarged from the prior MRI. Electronically Signed   By: ALogan BoresM.D.   On: 05/19/2016 13:14        Scheduled Meds: . sodium chloride   Intravenous Once  . budesonide (PULMICORT) nebulizer solution  0.25 mg Nebulization BID  . DULoxetine  30 mg Oral Daily  . enoxaparin  1.5 mg/kg Subcutaneous Q24H  . feeding supplement  1 Container Oral BID BM  . feeding supplement (ENSURE ENLIVE)  237 mL Oral BID BM  . guaiFENesin  600 mg Oral BID  . iopamidol      . multivitamin with minerals  1 tablet Oral Daily  . oseltamivir  30 mg Oral BID  . piperacillin-tazobactam (ZOSYN)  IV  3.375 g Intravenous Q8H  . potassium chloride  40 mEq Oral BID  . senna-docusate  1 tablet Oral BID  . vancomycin  1,000 mg Intravenous Q24H   Continuous Infusions: . sodium chloride 75 mL/hr at 05/19/16 1251     LOS: 2 days    Time spent: 30 minutes    ALoretha Stapler MD [facility] Pager [phone] If 7PM-7AM, please contact night-coverage [email or web address] Password [email or web address] 05/19/2016, 2:30 PM

## 2016-05-19 NOTE — Progress Notes (Signed)
Room given and report called to 4th floor RN. Waited for patient to wake up from nap. Went to room once patient awake and explained to husband and patient that she would be transferring [id number]. Husband then asked if she would have a Actuary. I explained there was no sitter, which there has not been all day, family has been taking turns staying with her. He said "she can't move, the one thing that sets me off is flat out lying - I don't do that, she told me, referring to the MD, that she would start out with a sitter. This RN called Dr Adair Patter and relayed message. Transfer canceled for floor.

## 2016-05-19 NOTE — Progress Notes (Signed)
IP PROGRESS NOTE  Subjective:   Mr. Araque denies pain. She wants to go home. Her sister is at the bedside. She and the nursing staff report Mr. Nissley remains very confused. Objective: Vital signs in last 24 hours: Blood pressure (!) 104/59, pulse (!) 105, temperature 98.6 F (37 C), temperature source Oral, resp. rate 17, height '5\' 5"'$  (1.651 m), weight 99 lb 3.3 oz (45 kg), SpO2 (!) 85 %.  Intake/Output from previous day: 01/15 0701 - 01/16 0700 In: 2385 [P.O.:310; I.V.:1725; IV Piggyback:350] Out: 600 [Urine:600]  Physical Exam:  HEENT: Multiple scalp masses  Neurologic: Alert, moves all extremities to command. Not oriented. Answers questions inappropriately.   Lab Results:  Recent Labs  05/18/16 0327 05/19/16 0356  WBC 5.2 2.4*  HGB 9.4* 8.3*  HCT 29.0* 26.2*  PLT 140* 102*    BMET  Recent Labs  05/17/16 0745 05/18/16 0327  NA 127* 130*  K 4.2 3.4*  CL 97* 97*  CO2 21* 21*  GLUCOSE 108* 104*  BUN 11 10  CREATININE 0.58 0.46  CALCIUM 7.9* 7.8*     Medications: I have reviewed the patient's current medications.  Assessment/Plan: 1. Limited stage small cell lung cancer, diagnosed on biopsy of a subcarinal lymph node and left lung mass by EBUSon 09/12/2015 ? PET scan 09/03/2015 confirmed a hypermetabolic left perihilar mass, 2 left lower lobe pulmonary masses, and mediastinal lymphadenopathy, no evidence of distant metastatic disease ? Brain MRI negative for metastatic disease 09/13/2015 ? Status post chest radiation 09/26/2015 through 10/18/2015  ? Cycle 1 cisplatin/etoposide beginning 09/26/2015 ? Cycle 2 cisplatin/etoposide beginning 10/16/2015 ? Cycle 3 carboplatin/etoposide beginning 11/06/2015 ? 12/17/2015 MRI of the brain negative ? 12/17/2015 CT scans chest/abdomen/pelvis (report currently not available) ? Bone scan 12/27/2015 with minimal increased uptake in the posterior aspect of the right 11th rib that does not appear abnormal on the CT  images of August 2017; sclerotic focus noted on the previous CT scan in the body of L1 showed no abnormal uptake on the current bone scan; asymmetric increased uptake in the left SI joint region possibly corresponding to a subtle sclerotic focus in the medial posterior aspect of the left iliac bone ? CT chest 04/07/2016-new liver metastases, new upper abdominal adenopathy, left iliac metastasis, new right posterior lateral  Pleural/chest wall metastasis ? Cycle 1 salvage chemotherapy with etoposide/carboplatin 05/11/2016  2. 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 Weight loss secondary to #2  4. History of febrile neutropenia following chemotherapy  5. Remote history of a right leg DVT-2008?-Maintained on Lovenox  6. History of tobacco use  7. Admission to [facility] 10/27/2015 with neutropenic fever, esophagitis, and dehydration  8. Hypercalcemia 05/01/2016-status post intravenous fluids and Zometa12/29/2017  9.   Fever prior to hospital admission 05/17/2016-infectious versus tumor fever  10.  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  Altered mental status-potentially related to infection/delirium, concern for brain metastases  12. Leukopenia/thrombocytopenia secondary to chemotherapy   Jenny [name] has extensive stage small cell lung cancer. She is now at day 9 following a first cycle of salvage chemotherapy with etoposide and carboplatin. The white count and platelets are falling. She remains confused. The etiology of the confusion is unclear. I recommend proceeding with a brain CT. Recommendations: 1. CBC with differential 05/20/2016 2. CT brain with and without contrast today   I discussed the current status prognosis with her sister. I will recommend  Hospice care if the CT confirms brain metastases.        LOS: 2 days    Betsy Coder, MD   05/19/2016, 12:22 PM

## 2016-05-20 DIAGNOSIS — E440 Moderate protein-calorie malnutrition: Secondary | ICD-10-CM

## 2016-05-20 DIAGNOSIS — I82409 Acute embolism and thrombosis of unspecified deep veins of unspecified lower extremity: Secondary | ICD-10-CM

## 2016-05-20 DIAGNOSIS — E43 Unspecified severe protein-calorie malnutrition: Secondary | ICD-10-CM

## 2016-05-20 DIAGNOSIS — J439 Emphysema, unspecified: Secondary | ICD-10-CM

## 2016-05-20 DIAGNOSIS — G9340 Encephalopathy, unspecified: Secondary | ICD-10-CM

## 2016-05-20 DIAGNOSIS — T451X5A Adverse effect of antineoplastic and immunosuppressive drugs, initial encounter: Secondary | ICD-10-CM

## 2016-05-20 DIAGNOSIS — C3490 Malignant neoplasm of unspecified part of unspecified bronchus or lung: Secondary | ICD-10-CM

## 2016-05-20 DIAGNOSIS — D6481 Anemia due to antineoplastic chemotherapy: Secondary | ICD-10-CM

## 2016-05-20 DIAGNOSIS — J189 Pneumonia, unspecified organism: Secondary | ICD-10-CM

## 2016-05-20 LAB — CBC WITH DIFFERENTIAL/PLATELET
BASOS ABS: 0 10*3/uL (ref 0.0–0.1)
Basophils Relative: 0 %
Eosinophils Absolute: 0.1 10*3/uL (ref 0.0–0.7)
Eosinophils Relative: 3 %
HEMATOCRIT: 25.2 % — AB (ref 36.0–46.0)
Hemoglobin: 8.1 g/dL — ABNORMAL LOW (ref 12.0–15.0)
LYMPHS ABS: 0.3 10*3/uL — AB (ref 0.7–4.0)
LYMPHS PCT: 14 %
MCH: 25.2 pg — ABNORMAL LOW (ref 26.0–34.0)
MCHC: 32.1 g/dL (ref 30.0–36.0)
MCV: 78.3 fL (ref 78.0–100.0)
MONO ABS: 0.1 10*3/uL (ref 0.1–1.0)
Monocytes Relative: 4 %
NEUTROS ABS: 1.5 10*3/uL — AB (ref 1.7–7.7)
Neutrophils Relative %: 80 %
Platelets: 73 10*3/uL — ABNORMAL LOW (ref 150–400)
RBC: 3.22 MIL/uL — ABNORMAL LOW (ref 3.87–5.11)
RDW: 17.3 % — AB (ref 11.5–15.5)
WBC: 1.9 10*3/uL — ABNORMAL LOW (ref 4.0–10.5)

## 2016-05-20 LAB — BASIC METABOLIC PANEL
ANION GAP: 4 — AB (ref 5–15)
BUN: 8 mg/dL (ref 6–20)
CO2: 22 mmol/L (ref 22–32)
Calcium: 7.8 mg/dL — ABNORMAL LOW (ref 8.9–10.3)
Chloride: 106 mmol/L (ref 101–111)
Creatinine, Ser: 0.45 mg/dL (ref 0.44–1.00)
GFR calc Af Amer: >60 mL/min (ref >60)
GFR calc non Af Amer: >60 mL/min (ref >60)
GLUCOSE: 88 mg/dL (ref 65–99)
Potassium: 4.1 mmol/L (ref 3.5–5.1)
Sodium: 132 mmol/L — ABNORMAL LOW (ref 135–145)

## 2016-05-20 MED ORDER — LEVOFLOXACIN 750 MG PO TABS: 750.0000 mg | ORAL_TABLET | Freq: Every day | ORAL | 0 refills | Status: DC

## 2016-05-20 MED ORDER — GUAIFENESIN ER 600 MG PO TB12: 600.0000 mg | ORAL_TABLET | Freq: Two times a day (BID) | ORAL | 0 refills | Status: DC

## 2016-05-20 MED ORDER — ENSURE ENLIVE PO LIQD: 237.0000 mL | Freq: Two times a day (BID) | ORAL | 12 refills | Status: AC

## 2016-05-20 MED ORDER — ADULT MULTIVITAMIN W/MINERALS CH
1.0000 | ORAL_TABLET | Freq: Every day | ORAL | 0 refills | Status: DC
Start: 2016-05-21 — End: 2016-10-15

## 2016-05-20 MED ORDER — HYDROCODONE-ACETAMINOPHEN 5-325 MG PO TABS: 1.0000 | ORAL_TABLET | Freq: Four times a day (QID) | ORAL | 0 refills | Status: DC | PRN

## 2016-05-20 MED ORDER — ENOXAPARIN SODIUM 80 MG/0.8ML ~~LOC~~ SOLN: 1.5000 mg/kg | SUBCUTANEOUS | 0 refills | Status: DC

## 2016-05-20 MED ORDER — HYDROCODONE-ACETAMINOPHEN 5-325 MG PO TABS: 1.0000 | ORAL_TABLET | ORAL | Status: DC | PRN

## 2016-05-20 MED ORDER — LEVOFLOXACIN 750 MG PO TABS
750.0000 mg | ORAL_TABLET | Freq: Every day | ORAL | Status: DC
  Administered 2016-05-20: 750 mg via ORAL
  Filled 2016-05-20: qty 1

## 2016-05-20 MED ORDER — POLYETHYLENE GLYCOL 3350 17 G PO PACK: 17.0000 g | PACK | Freq: Every day | ORAL | 0 refills | Status: AC | PRN

## 2016-05-20 NOTE — Progress Notes (Signed)
Pharmacy Antibiotic Note  Jenny [name] is a 66 y.o. female admitted on 05/17/2016 with sepsis, PNA.   She continues on day #4 Vancomycin, Zosyn, Tamiflu.    Pharmacy has been consulted for Vancomycin & Zosyn dosing.  05/20/2016:  Afebrile  WBC, Pltc trending down s/p chemo  Scr at patient's baseline.  Estimated CrCl ~ 57m/min. No outpt charted for 48hrs.   Qtc interval 4416mc  Plan: Discussed patient with Dr ShAlfredia Fergusonchange antibiotics to Levaquin '750mg'$  PO daily    Height: 5' 0.4" (153.4 cm) Weight: 109 lb 5.6 oz (49.6 kg) IBW/kg (Calculated) : 46.42  Temp (24hrs), Avg:98.1 F (36.7 C), Min:97.8 F (36.6 C), Max:98.4 F (36.9 C)   Recent Labs Lab 05/17/16 0745 05/17/16 0757 05/17/16 1055 05/18/16 0327 05/19/16 0356 05/19/16 1535 05/20/16 0540  WBC 6.9  --   --  5.2 2.4*  --  1.9*  CREATININE 0.58  --   --  0.46  --  0.49 0.45  LATICACIDVEN  --  0.98 0.82  --   --   --   --     Estimated Creatinine Clearance: 52.7 mL/min (by C-G formula based on SCr of 0.45 mg/dL).    Allergies  Allergen Reactions  . Fentanyl     Patient prefers to never take this medication again. She doesn't like the way it makes her feel.   . Morphine And Related Itching and Rash    Antimicrobials this admission: 1/14 Vanc >>  1/14 Zosyn >>  1/14 Tamiflu >>  Dose adjustments this admission:  Microbiology results: 1/14 BCx: ngtd 1/14 UCx: NGF 1/14 UCx: <10K cfu/ml, insignificant growth 1/14 MRSA PCR: neg 1/14 Strep pneumo ur ag: neg 1/14 Resp panel: Coronavirus detected   Thank you for allowing pharmacy to be a part of this patient's care.  MiNetta CedarsPharmD, BCPS Pager: [phone]/17/2018 9:43 AM

## 2016-05-20 NOTE — Discharge Summary (Signed)
Physician Discharge Summary  Jenny [name] WHQ:[id number] DOB: 1951/02/24 DOA: 05/17/2016  PCP: Eulas Post, MD  Admit date: 05/17/2016 Discharge date: 05/20/2016  Admitted From: Home Disposition:  [facility] [facility]  Recommendations for Outpatient Follow-up:  1. Follow up with PCP in 1-2 weeks 2. Follow up with Oncology Dr. Benay Spice the week of 05/25/16 3. Please obtain BMP/CBC and Ammonia Levels in one week  Home Health: YES Equipment/Devices: None  Discharge Condition: Stable CODE STATUS: FULL CODE Diet recommendation: Regular  Brief/Interim Summary: Jenny [name] a 66 y.o.femalewith PMH significant for metastatic small cell lung cancer, DVT (on lovenox), depression, anxiety, chronic anemia and moderate protein calorie malnutrition; who presented to ED with worsening SOB, altered mental status and high grade fever. Family reports patient been very confused and with difficulty to perform basic tasks (usually AAOX4). They also reported anorexia and poor PO intake. No CP, no abd pain, no dysuria, no hematuria, no hematemesis, melena or hematochezia. Of note; patient is actively receiving chemotherapy and most recent treatment was on 05/13/16. Found to be septic from HCAP and had Coronavirus positive of Respiratory Panel. Patient's Encephalopathy tremendously improved and per husband was almost back to her baseline. At this time she was deemed medically stable to be D/C'd home and follow up with PCP and Oncology as an outpatient.   Discharge Diagnoses:  Principal Problem:   Encephalopathy Active Problems:   DVT, lower extremity, recurrent (HCC)   COPD (chronic obstructive pulmonary disease) (HCC)   Small cell lung cancer (HCC)   Malnutrition of moderate degree   Anemia associated with chemotherapy   HCAP (healthcare-associated pneumonia)   Dehydration with hyponatremia   Pressure injury of skin   Protein-calorie malnutrition, severe   Altered mental status   Encephalopathy  acute  Sepsis due to HCAP in the setting for Coronovirus -Sepsis Physiology Improved -patient met sepsis criteria on admission with ongoing high temp (102) while at home, tachycardia, abnormal CXR and acute encephalopathy (associated with infection process) -given active chemotherapy, will treat for HCAP -started on vanc and zosyn and transitioned to Levofloxacin at D/C - flu PCR negative - respiratory panel positive for coronavirus - continued oxygen supplementation, nebulizer treatment, pulmicort, and flutter valve - patient started on mucinex BID and will continue at D/C - will follow clinical response and weaned oxygen supplementation as tolerated - blood cultures: NG x 3 day - procalcitonin of 0.39 - urine strep pneumo antigen negative - legionella antigen pending - urine culture showing insufficient growth -lactic acid was normal  Encephalopathy: most likely toxic and due to sepsis -Much Improved -Treated with antibiotics and supportive care as mentioned above -low Hgb and hyponatremia contributing - transfused 2 units of PRBC's - sodium of 132 yesterdday (improved from 127) and will replete/follow electrolytes  - TSH WNL - B12 WNL - CT head: Multiple skull metastases with epidural extension as above, with the largest in the midline frontal bone and compressing or partially invading the superior sagittal sinus, which remains patent. This metastasis has mildly enlarged from the prior MRI.  DVT, lower extremity, recurrent (HCC) -continue daily lovenox injection  COPD (chronic obstructive pulmonary disease) ([place]) -patient with slight wheezing -will use Duoneb as needed and pulmicort  Small cell lung cancer ([place]) - Dr. Benay Spice contacted and made aware of admission - appreciated Dr. Benay Spice recommendations and will follow up as an outpatient  Malnutrition of moderate degree -nutritional service consulted -will use ensure BID and follow rec's for supplementation   -Body mass index is  16.31 kg/m.  Anemia associated with chemotherapy s/p transfusions of 2 units of pRBC's -Patient's Hb/Hct was stable at 8.1/25.2 -Repeat CBC as an outpatient   Neutropenia -In the setting of Chemotherapy -Follow up as outpatient with Dr. Benay Spice -Not in Neutropenic Fever -May need Granix or Neupogen but will defer to Oncology  Dehydration with hyponatremia -Improved. Na+ was 132 at D/C  -Will need Follow up BMP in AM  Depression and anxiety  -will continue PRN alprazolam and continue cymbalta - increase frequency of alprazolam but decrease dose to 0.22m  Discharge Instructions  Discharge Instructions    Call MD for:  difficulty breathing, headache or visual disturbances    Complete by:  As directed    Call MD for:  extreme fatigue    Complete by:  As directed    Call MD for:  persistant dizziness or light-headedness    Complete by:  As directed    Call MD for:  persistant nausea and vomiting    Complete by:  As directed    Call MD for:  severe uncontrolled pain    Complete by:  As directed    Call MD for:  temperature >100.4    Complete by:  As directed    Diet - low sodium heart healthy    Complete by:  As directed    Discharge instructions    Complete by:  As directed    Follow up with PCP within 1 week and repeat Blood Work in Am. Take All medications as prescribed and continue Physical Therapy with Home Health Pt. Follow up with Dr. SBenay Spiceas an outpatient. If symptoms worsen or change please return to PCP or ED for evaluation.   Increase activity slowly    Complete by:  As directed      Allergies as of 05/20/2016      Reactions   Fentanyl    Patient prefers to never take this medication again. She doesn't like the way it makes her feel.    Morphine And Related Itching, Rash      Medication List    STOP taking these medications   oxyCODONE 5 MG immediate release tablet Commonly known as:  Oxy IR/ROXICODONE     TAKE these  medications   acetaminophen 500 MG tablet Commonly known as:  TYLENOL Take 500 mg by mouth every 8 (eight) hours as needed.   ALPRAZolam 0.25 MG tablet Commonly known as:  XANAX Take 0.25 mg by mouth daily as needed for anxiety or sleep.   DULoxetine 30 MG capsule Commonly known as:  CYMBALTA Take 30 mg by mouth daily.   enoxaparin 80 MG/0.8ML injection Commonly known as:  LOVENOX Inject 0.65 mLs (65 mg total) into the skin daily. What changed:  medication strength  how much to take   feeding supplement (ENSURE ENLIVE) Liqd Take 237 mLs by mouth 2 (two) times daily between meals.   guaiFENesin 600 MG 12 hr tablet Commonly known as:  MUCINEX Take 1 tablet (600 mg total) by mouth 2 (two) times daily.   HYDROcodone-acetaminophen 5-325 MG tablet Commonly known as:  NORCO/VICODIN Take 1 tablet by mouth every 6 (six) hours as needed for moderate pain.   levofloxacin 750 MG tablet Commonly known as:  LEVAQUIN Take 1 tablet (750 mg total) by mouth daily.   meloxicam 15 MG tablet Commonly known as:  MOBIC Take 15 mg by mouth daily. Started on 03/05/16 X 30 days from [facility]   multivitamin with minerals Tabs tablet Take  1 tablet by mouth daily. Start taking on:  05/21/2016   ondansetron 8 MG disintegrating tablet Commonly known as:  ZOFRAN-ODT Take 8 mg by mouth every 8 (eight) hours as needed for nausea or vomiting.   polyethylene glycol packet Commonly known as:  MIRALAX Take 17 g by mouth daily as needed for mild constipation. What changed:  when to take this  reasons to take this   senna-docusate 8.6-50 MG tablet Commonly known as:  Senokot-S Take 1 tablet by mouth 2 (two) times daily.   Vitamin D (Ergocalciferol) 50000 units Caps capsule Commonly known as:  DRISDOL TAKE ONE CAPSULE BY MOUTH ONCE A WEEK ON THURSDAYS       Allergies  Allergen Reactions  . Fentanyl     Patient prefers to never take this medication again. She doesn't like the way it makes her  feel.   . Morphine And Related Itching and Rash    Consultations:  Oncology  Procedures/Studies: Ct Head W & Wo Contrast  Result Date: 05/19/2016 CLINICAL DATA:  Altered mental status. History of extensive stage small-cell lung cancer. EXAM: CT HEAD WITHOUT AND WITH CONTRAST TECHNIQUE: Contiguous axial images were obtained from the base of the skull through the vertex without and with intravenous contrast CONTRAST:  176m ISOVUE-300 IOPAMIDOL (ISOVUE-300) INJECTION 61% [facility]  [facility]: There is no evidence of acute cortical infarct, intracranial hemorrhage, intra-axial mass, midline shift, or extra-axial fluid collection. The ventricles and sulci are normal. No enhancing parenchymal brain lesions are identified. A partially empty sella is again seen. Vascular: Minimal calcified at sclerosis at the skullbase. No hyperdense vessel. Skull: There are multiple osseous metastases as demonstrated on the prior outside MRI. The largest involves the frontoparietal skull in the midline and demonstrates extension both into the scalp soft tissues as well as extension intracranially the volume of extraosseous tumor at this location overall appears mildly increased from the prior MRI. Tumor extends over approximately a 9 cm length intracranially, compressing and inferiorly displacing the superior sagittal sinus with invasion not excluded. The sinus remains patent. A smaller lesion involves the right frontal skull laterally with extension into the scalp soft tissues and as well as mild epidural extension. Additional similar lesions are present involving the parietal bone bilaterally with small amount of epidural extension. Sinuses/Orbits: Small right sphenoid sinus mucous retention cyst. Visualized mastoid air cells are clear. Orbits are unremarkable. Other: None. IMPRESSION: 1. No evidence of acute infarct, intracranial hemorrhage, or parenchymal brain metastases. 2. Multiple  skull metastases with epidural extension as above, with the largest in the midline frontal bone and compressing or partially invading the superior sagittal sinus, which remains patent. This metastasis has mildly enlarged from the prior MRI. Electronically Signed   By: ALogan BoresM.D.   On: 05/19/2016 13:14   Dg Chest Port 1 View  Result Date: 05/17/2016 CLINICAL DATA:  Fever and disorientation x 2 days; patient is undergoing chemo for lung CA EXAM: PORTABLE CHEST 1 VIEW COMPARISON:  01/30/2016 FINDINGS: Midline trachea. Patient minimally rotated left. Mild cardiomegaly. Probable small left pleural effusion. Clear right lung. Worsened left base aeration. Suspicion of left suprahilar soft tissue fullness, new since the prior exam. IMPRESSION: Worsened left lower lobe aeration with development of pleural fluid and increased airspace disease. Given the appearance on prior CT, this airspace disease could represent infection, postobstructive pneumonitis, or could be treatment related. Left suprahilar soft tissue fullness could represent progressive adenopathy or a focus of infection. Electronically  Signed   By: Abigail Miyamoto M.D.   On: 05/17/2016 09:29   Subjective: Seen and examined at bedside and was doing much better. Still had a cough but no Cp. No N/V. Encephalopathy resolved and per husband back to baseline.  Discharge Exam: Vitals:   05/20/16 0527 05/20/16 1407  BP: 105/63 107/74  Pulse: 88 (!) 105  Resp: 18 20  Temp: 98 F (36.7 C) 98.5 F (36.9 C)   Vitals:   05/19/16 2036 05/20/16 0527 05/20/16 1001 05/20/16 1407  BP:  105/63  107/74  Pulse:  88  (!) 105  Resp:  18  20  Temp:  98 F (36.7 C)  98.5 F (36.9 C)  TempSrc:  Oral  Oral  SpO2: 98% 97% 97% 98%  Weight:  49.6 kg (109 lb 5.6 oz)    Height:       General: Pt is alert, awake, not in acute distress Cardiovascular: RRR, S1/S2 +, no rubs, no gallops Respiratory: Diminished bilaterally, no wheezing, no rhonchi; Patient not  tachypenic or using accessory muscles to breathe. Abdominal: Soft, NT, ND, bowel sounds + Extremities: no edema, no cyanosis  The results of significant diagnostics from this hospitalization (including imaging, microbiology, ancillary and laboratory) are listed below for reference.    Microbiology: Recent Results (from the past 240 hour(s))  Blood Culture (routine x 2)     Status: None (Preliminary result)   Collection Time: 05/17/16  7:45 AM  Result Value Ref Range Status   Specimen Description BLOOD LEFT HAND  Final   Special Requests IN PEDIATRIC BOTTLE 2CC  Final   Culture   Final    NO GROWTH 3 DAYS Performed at [facility], [address] [place], [place] [address]    Report Status PENDING  Incomplete  Blood Culture (routine x 2)     Status: None (Preliminary result)   Collection Time: 05/17/16  7:45 AM  Result Value Ref Range Status   Specimen Description BLOOD LAC  Final   Special Requests BOTTLES DRAWN AEROBIC AND ANAEROBIC 5CC  Final   Culture   Final    NO GROWTH 3 DAYS Performed at [facility], [place] [address] [place], [place] [address]    Report Status PENDING  Incomplete  Urine culture     Status: None   Collection Time: 05/17/16  9:33 AM  Result Value Ref Range Status   Specimen Description URINE, CATHETERIZED  Final   Special Requests NONE  Final   Culture NO GROWTH Performed at [facility]   Final   Report Status 05/18/2016 FINAL  Final  Urine culture     Status: Abnormal   Collection Time: 05/17/16 12:41 PM  Result Value Ref Range Status   Specimen Description URINE, RANDOM  Final   Culture (A)  Final    <10,000 COLONIES/mL INSIGNIFICANT GROWTH Performed at [facility]    Report Status 05/18/2016 FINAL  Final  Respiratory Panel by PCR     Status: Abnormal   Collection Time: 05/17/16  1:05 PM  Result Value Ref Range Status   Adenovirus NOT DETECTED NOT DETECTED Final   Coronavirus 229E NOT DETECTED NOT DETECTED Final    Coronavirus HKU1 NOT DETECTED NOT DETECTED Final   Coronavirus NL63 DETECTED (A) NOT DETECTED Final   Coronavirus OC43 NOT DETECTED NOT DETECTED Final   Metapneumovirus NOT DETECTED NOT DETECTED Final   Rhinovirus / Enterovirus NOT DETECTED NOT DETECTED Final   Influenza A NOT  DETECTED NOT DETECTED Final   Influenza B NOT DETECTED NOT DETECTED Final   Parainfluenza Virus 1 NOT DETECTED NOT DETECTED Final   Parainfluenza Virus 2 NOT DETECTED NOT DETECTED Final   Parainfluenza Virus 3 NOT DETECTED NOT DETECTED Final   Parainfluenza Virus 4 NOT DETECTED NOT DETECTED Final   Respiratory Syncytial Virus NOT DETECTED NOT DETECTED Final   Bordetella pertussis NOT DETECTED NOT DETECTED Final   Chlamydophila pneumoniae NOT DETECTED NOT DETECTED Final   Mycoplasma pneumoniae NOT DETECTED NOT DETECTED Final    Comment: Performed at [facility]  MRSA PCR Screening     Status: None   Collection Time: 05/17/16  1:05 PM  Result Value Ref Range Status   MRSA by PCR NEGATIVE NEGATIVE Final    Comment:        The GeneXpert MRSA Assay (FDA approved for NASAL specimens only), is one component of a comprehensive MRSA colonization surveillance program. It is not intended to diagnose MRSA infection nor to guide or monitor treatment for MRSA infections.     Labs: BNP (last 3 results) No results for input(s): BNP in the last 8760 hours. Basic Metabolic Panel:  Recent Labs Lab 05/17/16 0745 05/18/16 0327 05/19/16 1535 05/20/16 0540  NA 127* 130* 133* 132*  K 4.2 3.4* 4.4 4.1  CL 97* 97* 106 106  CO2 21* 21* 22 22  GLUCOSE 108* 104* 120* 88  BUN '11 10 9 8  ' CREATININE 0.58 0.46 0.49 0.45  CALCIUM 7.9* 7.8* 7.7* 7.8*  MG  --   --  1.7  --    Liver Function Tests:  Recent Labs Lab 05/17/16 0745  AST 25  ALT 10*  ALKPHOS 100  BILITOT 0.4  PROT 5.6*  ALBUMIN 2.3*   No results for input(s): LIPASE, AMYLASE in the last 168 hours. No results for input(s): AMMONIA in the last  168 hours. CBC:  Recent Labs Lab 05/17/16 0745 05/18/16 0327 05/19/16 0356 05/20/16 0540  WBC 6.9 5.2 2.4* 1.9*  NEUTROABS 6.6  --  2.1 1.5*  HGB 6.9* 9.4* 8.3* 8.1*  HCT 21.7* 29.0* 26.2* 25.2*  MCV 73.1* 75.5* 75.3* 78.3  PLT 185 140* 102* 73*   Cardiac Enzymes: No results for input(s): CKTOTAL, CKMB, CKMBINDEX, TROPONINI in the last 168 hours. BNP: Invalid input(s): POCBNP CBG: No results for input(s): GLUCAP in the last 168 hours. D-Dimer No results for input(s): DDIMER in the last 72 hours. Hgb A1c No results for input(s): HGBA1C in the last 72 hours. Lipid Profile No results for input(s): CHOL, HDL, LDLCALC, TRIG, CHOLHDL, LDLDIRECT in the last 72 hours. Thyroid function studies  Recent Labs  05/18/16 0327  TSH 1.941   Anemia work up  Recent Labs  05/18/16 0327  VITAMINB12 192   Urinalysis    Component Value Date/Time   COLORURINE YELLOW 05/17/2016 0933   APPEARANCEUR CLEAR 05/17/2016 0933   LABSPEC 1.016 05/17/2016 0933   PHURINE 6.0 05/17/2016 0933   GLUCOSEU NEGATIVE 05/17/2016 0933   HGBUR NEGATIVE 05/17/2016 0933   BILIRUBINUR NEGATIVE 05/17/2016 0933   BILIRUBINUR negative 04/18/2015 0856   KETONESUR 20 (A) 05/17/2016 0933   PROTEINUR NEGATIVE 05/17/2016 0933   UROBILINOGEN 0.2 04/18/2015 0856   NITRITE NEGATIVE 05/17/2016 0933   LEUKOCYTESUR NEGATIVE 05/17/2016 0933   Sepsis Labs Invalid input(s): PROCALCITONIN,  WBC,  LACTICIDVEN Microbiology Recent Results (from the past 240 hour(s))  Blood Culture (routine x 2)     Status: None (Preliminary result)   Collection Time:  05/17/16  7:45 AM  Result Value Ref Range Status   Specimen Description BLOOD LEFT HAND  Final   Special Requests IN PEDIATRIC BOTTLE 2CC  Final   Culture   Final    NO GROWTH 3 DAYS Performed at [facility], [address], [place], [place] [address]    Report Status PENDING  Incomplete  Blood Culture (routine x 2)     Status: None (Preliminary result)    Collection Time: 05/17/16  7:45 AM  Result Value Ref Range Status   Specimen Description BLOOD LAC  Final   Special Requests BOTTLES DRAWN AEROBIC AND ANAEROBIC 5CC  Final   Culture   Final    NO GROWTH 3 DAYS Performed at [facility], [place] [address], [place], [place] [address]    Report Status PENDING  Incomplete  Urine culture     Status: None   Collection Time: 05/17/16  9:33 AM  Result Value Ref Range Status   Specimen Description URINE, CATHETERIZED  Final   Special Requests NONE  Final   Culture NO GROWTH Performed at [facility]   Final   Report Status 05/18/2016 FINAL  Final  Urine culture     Status: Abnormal   Collection Time: 05/17/16 12:41 PM  Result Value Ref Range Status   Specimen Description URINE, RANDOM  Final   Culture (A)  Final    <10,000 COLONIES/mL INSIGNIFICANT GROWTH Performed at [facility]    Report Status 05/18/2016 FINAL  Final  Respiratory Panel by PCR     Status: Abnormal   Collection Time: 05/17/16  1:05 PM  Result Value Ref Range Status   Adenovirus NOT DETECTED NOT DETECTED Final   Coronavirus 229E NOT DETECTED NOT DETECTED Final   Coronavirus HKU1 NOT DETECTED NOT DETECTED Final   Coronavirus NL63 DETECTED (A) NOT DETECTED Final   Coronavirus OC43 NOT DETECTED NOT DETECTED Final   Metapneumovirus NOT DETECTED NOT DETECTED Final   Rhinovirus / Enterovirus NOT DETECTED NOT DETECTED Final   Influenza A NOT DETECTED NOT DETECTED Final   Influenza B NOT DETECTED NOT DETECTED Final   Parainfluenza Virus 1 NOT DETECTED NOT DETECTED Final   Parainfluenza Virus 2 NOT DETECTED NOT DETECTED Final   Parainfluenza Virus 3 NOT DETECTED NOT DETECTED Final   Parainfluenza Virus 4 NOT DETECTED NOT DETECTED Final   Respiratory Syncytial Virus NOT DETECTED NOT DETECTED Final   Bordetella pertussis NOT DETECTED NOT DETECTED Final   Chlamydophila pneumoniae NOT DETECTED NOT DETECTED Final   Mycoplasma pneumoniae NOT DETECTED NOT  DETECTED Final    Comment: Performed at [facility]  MRSA PCR Screening     Status: None   Collection Time: 05/17/16  1:05 PM  Result Value Ref Range Status   MRSA by PCR NEGATIVE NEGATIVE Final    Comment:        The GeneXpert MRSA Assay (FDA approved for NASAL specimens only), is one component of a comprehensive MRSA colonization surveillance program. It is not intended to diagnose MRSA infection nor to guide or monitor treatment for MRSA infections.    Time coordinating discharge: Over 30 minutes  SIGNED:  Kerney Elbe, DO [facility] 05/20/2016, 7:39 PM Pager [phone]  If 7PM-7AM, please contact night-coverage [email or web address] Password [email or web address]

## 2016-05-20 NOTE — Progress Notes (Signed)
Physical Therapy Treatment Patient Details Name: Jenny [name] MRN: [id number] DOB: [birth year] Today's Date: 13-Jun-2016    History of Present Illness  66 y.o. female with PMH significant for metastatic small cell lung cancer, DVT (on lovenox), depression, anxiety, chronic anemia and moderate protein calorie malnutrition; who presented to ED with worsening SOB, altered mental status and high grade fever.    PT Comments    Pt ambulated in hallway and mildly unsteady however no LOB observed.  Spouse present and reports family to remain with pt 24/7 for safety.  Pt reports desire to d/c home.  Recommend HHPT.  Follow Up Recommendations  Home health PT;Supervision/Assistance - 24 hour     Equipment Recommendations  None recommended by PT    Recommendations for Other Services       Precautions / Restrictions Precautions Precautions: Fall Precaution Comments: monitor HR    Mobility  Bed Mobility Overal bed mobility: Modified Independent                Transfers Overall transfer level: Needs assistance Equipment used: None Transfers: Sit to/from Stand Sit to Stand: Supervision         General transfer comment: supervision for safety  Ambulation/Gait Ambulation/Gait assistance: Min guard Ambulation Distance (Feet): 160 Feet Assistive device: None Gait Pattern/deviations: Step-through pattern;Narrow base of support;Decreased stride length     General Gait Details: occasionally grazing hallway railing, slightly unsteady however no LOB, min/guard for safety, HR up to 135 bpm during gait   Stairs            Wheelchair Mobility    Modified Rankin (Stroke Patients Only)       Balance                                    Cognition Arousal/Alertness: Awake/alert Behavior During Therapy: Impulsive Overall Cognitive Status: Impaired/Different from baseline Area of Impairment: Safety/judgement;Awareness         Safety/Judgement: Decreased  awareness of safety;Decreased awareness of deficits     General Comments: required frequent reminders for line management    Exercises      General Comments        Pertinent Vitals/Pain Pain Assessment: No/denies pain    Home Living                      Prior Function            PT Goals (current goals can now be found in the care plan section) Progress towards PT goals: Progressing toward goals    Frequency    Min 3X/week      PT Plan Discharge plan needs to be updated    Co-evaluation             End of Session Equipment Utilized During Treatment: Gait belt Activity Tolerance: Patient tolerated treatment well Patient left: in bed;with call bell/phone within reach;with bed alarm set;with family/visitor present     Time: 1425-1435 PT Time Calculation (min) (ACUTE ONLY): 10 min  Charges:  $Gait Training: 8-22 mins                    G Codes:      Olufemi Mofield,KATHrine E 06-13-16, 3:20 PM Carmelia Bake, PT, DPT 06-13-2016 Pager: [phone]

## 2016-05-20 NOTE — Progress Notes (Signed)
IP PROGRESS NOTE  Subjective:   Jenny [name] has pain only at the area of early sacral skin breakdown. The bone pain has improved. Jenny [name] is at the bedside. He reports Jenny [name] was very confused on hospital admission, but this has improved. Objective: Vital signs in last 24 hours: Blood pressure 105/63, pulse 88, temperature 98 F (36.7 C), temperature source Oral, resp. rate 18, height 5' 0.4" (1.534 m), weight 109 lb 5.6 oz (49.6 kg), SpO2 97 %.  Intake/Output from previous day: 01/16 0701 - 01/17 0700 In: 2750 [P.O.:600; I.V.:1800; IV Piggyback:350] Out: -   Physical Exam:  HEENT: Multiple scalp masses, no thrush Lungs: Clear bilaterally Cardiac: Regular rate and rhythm Neurologic: Alert, moves all extremities to command. Not oriented to year, oriented to month, place, and situation.   Lab Results:  Recent Labs  05/19/16 0356 05/20/16 0540  WBC 2.4* 1.9*  HGB 8.3* 8.1*  HCT 26.2* 25.2*  PLT 102* 73*    BMET  Recent Labs  05/19/16 1535 05/20/16 0540  NA 133* 132*  K 4.4 4.1  CL 106 106  CO2 22 22  GLUCOSE 120* 88  BUN 9 8  CREATININE 0.49 0.45  CALCIUM 7.7* 7.8*     Medications: I have reviewed the patient's current medications.  Assessment/Plan: 1. Limited stage small cell lung cancer, diagnosed on biopsy of a subcarinal lymph node and left lung mass by EBUSon 09/12/2015 ? PET scan 09/03/2015 confirmed a hypermetabolic left perihilar mass, 2 left lower lobe pulmonary masses, and mediastinal lymphadenopathy, no evidence of distant metastatic disease ? Brain MRI negative for metastatic disease 09/13/2015 ? Status post chest radiation 09/26/2015 through 10/18/2015  ? Cycle 1 cisplatin/etoposide beginning 09/26/2015 ? Cycle 2 cisplatin/etoposide beginning 10/16/2015 ? Cycle 3 carboplatin/etoposide beginning 11/06/2015 ? 12/17/2015 MRI of the brain negative ? 12/17/2015 CT scans chest/abdomen/pelvis (report currently not available) ? Bone scan 12/27/2015  with minimal increased uptake in the posterior aspect of the right 11th rib that does not appear abnormal on the CT images of August 2017; sclerotic focus noted on the previous CT scan in the body of L1 showed no abnormal uptake on the current bone scan; asymmetric increased uptake in the left SI joint region possibly corresponding to a subtle sclerotic focus in the medial posterior aspect of the left iliac bone ? CT chest 04/07/2016-new liver metastases, new upper abdominal adenopathy, left iliac metastasis, new right posterior lateral  Pleural/chest wall metastasis ? Cycle 1 salvage chemotherapy with etoposide/carboplatin 05/11/2016  2. 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 Weight loss secondary to #2  4. History of febrile neutropenia following chemotherapy  5. Remote history of a right leg DVT-2008?-Maintained on Lovenox  6. History of tobacco use  7. Admission to [facility] 10/27/2015 with neutropenic fever, esophagitis, and dehydration  8. Hypercalcemia 05/01/2016-status post intravenous fluids and Zometa12/29/2017  9.   Fever prior to hospital admission 05/17/2016-infectious versus tumor fever  10.  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  Altered mental status-potentially related to infection/delirium, concern for brain metastases  12. Leukopenia/thrombocytopenia secondary to chemotherapy   Jenny [name] has extensive stage small cell lung cancer. Jenny [name] is now at day 10 following a first cycle of salvage chemotherapy with etoposide and carboplatin. The white count and platelets are falling.  Jenny [name] continues to have mild confusion, but this appears improved. No brain metastases were noted on the CT yesterday. Multiple skull-based metastases  were noted. There is no clear explanation for the confusion. The confusion may be related to  polypharmacy. Recommendations: 1. CBC with differential and ammonia level 05/21/2016 2. Change back to hydrocodone to use as needed for pain 3. Outpatient follow-up will be scheduled at the Cancer center for the week of 05/25/2016   I discussed the current status prognosis with Jenny [name]. I will recommend Hospice care if the CT confirms brain metastases.        LOS: 3 days   Betsy Coder, MD   05/20/2016, 9:16 AM

## 2016-05-21 ENCOUNTER — Other Ambulatory Visit: Payer: Self-pay | Admitting: *Deleted

## 2016-05-21 ENCOUNTER — Telehealth: Payer: Self-pay | Admitting: *Deleted

## 2016-05-21 ENCOUNTER — Telehealth: Payer: Self-pay | Admitting: Oncology

## 2016-05-21 DIAGNOSIS — C3490 Malignant neoplasm of unspecified part of unspecified bronchus or lung: Secondary | ICD-10-CM

## 2016-05-21 LAB — LEGIONELLA PNEUMOPHILA SEROGP 1 UR AG: L. PNEUMOPHILA SEROGP 1 UR AG: NEGATIVE

## 2016-05-21 NOTE — Telephone Encounter (Signed)
Spoke with patient re appointments for 1/19 and 1/24

## 2016-05-21 NOTE — Telephone Encounter (Signed)
FYI "I was hospitalized in [place] a few days, discharged and calling to schedule lab and follow up visit with Dr. Benay Spice as he instructed."  Call transferred to scheduling [phone] option 2.  Second call received from patient stating no one answered.  Instructed to stay on the line, calls will be answered in order received.

## 2016-05-22 ENCOUNTER — Other Ambulatory Visit (HOSPITAL_BASED_OUTPATIENT_CLINIC_OR_DEPARTMENT_OTHER): Payer: BLUE CROSS/BLUE SHIELD

## 2016-05-22 ENCOUNTER — Telehealth: Payer: Self-pay | Admitting: Oncology

## 2016-05-22 DIAGNOSIS — C3490 Malignant neoplasm of unspecified part of unspecified bronchus or lung: Secondary | ICD-10-CM | POA: Diagnosis not present

## 2016-05-22 LAB — CBC WITH DIFFERENTIAL/PLATELET
BASO%: 0.7 % (ref 0.0–2.0)
Basophils Absolute: 0 10*3/uL (ref 0.0–0.1)
EOS ABS: 0 10*3/uL (ref 0.0–0.5)
EOS%: 1.3 % (ref 0.0–7.0)
HEMATOCRIT: 30.8 % — AB (ref 34.8–46.6)
HGB: 9.9 g/dL — ABNORMAL LOW (ref 11.6–15.9)
LYMPH#: 0.3 10*3/uL — AB (ref 0.9–3.3)
LYMPH%: 20.3 % (ref 14.0–49.7)
MCH: 24.8 pg — ABNORMAL LOW (ref 25.1–34.0)
MCHC: 32.1 g/dL (ref 31.5–36.0)
MCV: 77 fL — ABNORMAL LOW (ref 79.5–101.0)
MONO#: 0.1 10*3/uL (ref 0.1–0.9)
MONO%: 9.2 % (ref 0.0–14.0)
NEUT%: 68.5 % (ref 38.4–76.8)
NEUTROS ABS: 1.1 10*3/uL — AB (ref 1.5–6.5)
Platelets: 62 10*3/uL — ABNORMAL LOW (ref 145–400)
RBC: 4 10*6/uL (ref 3.70–5.45)
RDW: 17.6 % — ABNORMAL HIGH (ref 11.2–14.5)
WBC: 1.5 10*3/uL — AB (ref 3.9–10.3)
nRBC: 0 % (ref 0–0)

## 2016-05-22 LAB — CULTURE, BLOOD (ROUTINE X 2)
CULTURE: NO GROWTH
Culture: NO GROWTH

## 2016-05-22 NOTE — Telephone Encounter (Signed)
Added lab to 1/24 f/u per 1/18 sch msg. Spoke with patient she is aware.

## 2016-05-22 NOTE — ED Provider Notes (Signed)
[facility] DEPT Provider Note   CSN: [id number] Arrival date & time: 05/17/16  5462     History   Chief Complaint Chief Complaint  Patient presents with  . Altered Mental Status  . Fever  . Nausea  . Cancer    HPI Jenny [name] is a 66 y.o. female. Chief complaint is fever and confusion  HPI:  34 through female history of metastatic small cell lung cancer, previous DVT, depression/anxiety, anemia. She presents with shortness of breath confusion and high fever from home. Having difficulty using and performing simple ADLs at home. Anorexia but no vomiting. Last chemotherapy January 10.  Past Medical History:  Diagnosis Date  . DVT (deep venous thrombosis) ([place])    1 year ago  . History of chemotherapy   . Hx of radiation therapy   . Hypercholesteremia   . Lung cancer [facility])     Patient Active Problem List   Diagnosis Date Noted  . Protein-calorie malnutrition, severe 05/19/2016  . Altered mental status   . Encephalopathy acute   . Pressure injury of skin 05/18/2016  . Anemia associated with chemotherapy 05/17/2016  . HCAP (healthcare-associated pneumonia) 05/17/2016  . Encephalopathy 05/17/2016  . Dehydration with hyponatremia 05/17/2016  . Goals of care, counseling/discussion 05/01/2016  . Malnutrition of moderate degree 11/12/2015  . Dysphagia 11/11/2015  . Esophagitis 11/11/2015  . Odynophagia   . Anorexia   . Small cell lung cancer ([place]) 09/18/2015  . Community acquired pneumonia of left lower lobe of lung ([place]) 08/28/2015  . Hemoptysis 08/28/2015  . COPD (chronic obstructive pulmonary disease) ([place]) 08/28/2015  . Lung mass 08/26/2015  . Other acute sinusitis 10/27/2013  . Anticoagulant long-term use 10/27/2013  . Cough over 2 weeks 10/27/2013  . Encounter for therapeutic drug monitoring 10/05/2013  . Hyperlipidemia 11/04/2011  . DVT, lower extremity, recurrent ([place]) 11/04/2011  . Nicotine use disorder 11/04/2011    Past Surgical History:    Procedure Laterality Date  . CESAREAN SECTION    . ESOPHAGOGASTRODUODENOSCOPY (EGD) WITH PROPOFOL N/A 11/12/2015   Procedure: ESOPHAGOGASTRODUODENOSCOPY (EGD) WITH PROPOFOL;  Surgeon: Doran Stabler, MD;  Location: [facility] ENDOSCOPY;  Service: Endoscopy;  Laterality: N/A;  With propofol if available    OB History    No data available       Home Medications    Prior to Admission medications   Medication Sig Start Date End Date Taking? Authorizing Provider  acetaminophen (TYLENOL) 500 MG tablet Take 500 mg by mouth every 8 (eight) hours as needed.   Yes Historical Provider, MD  ALPRAZolam Duanne Moron) 0.25 MG tablet Take 0.25 mg by mouth daily as needed for anxiety or sleep.  09/10/15  Yes Historical Provider, MD  ondansetron (ZOFRAN-ODT) 8 MG disintegrating tablet Take 8 mg by mouth every 8 (eight) hours as needed for nausea or vomiting.   Yes Historical Provider, MD  senna-docusate (SENOKOT-S) 8.6-50 MG tablet Take 1 tablet by mouth 2 (two) times daily. 05/01/16  Yes Ladell Pier, MD  Vitamin D, Ergocalciferol, (DRISDOL) 50000 units CAPS capsule TAKE ONE CAPSULE BY MOUTH ONCE A WEEK ON THURSDAYS 02/25/16  Yes Eulas Post, MD  DULoxetine (CYMBALTA) 30 MG capsule Take 30 mg by mouth daily. 05/04/16   Historical Provider, MD  enoxaparin (LOVENOX) 80 MG/0.8ML injection Inject 0.65 mLs (65 mg total) into the skin daily. 05/20/16   Kerney Elbe, DO  feeding supplement, ENSURE ENLIVE, (ENSURE ENLIVE) LIQD Take 237 mLs by mouth 2 (two) times daily between meals.  05/20/16   [place], DO  guaiFENesin (MUCINEX) 600 MG 12 hr tablet Take 1 tablet (600 mg total) by mouth 2 (two) times daily. 05/20/16   Kerney Elbe, DO  HYDROcodone-acetaminophen (NORCO/VICODIN) 5-325 MG tablet Take 1 tablet by mouth every 6 (six) hours as needed for moderate pain. 05/20/16   [place], DO  levofloxacin (LEVAQUIN) 750 MG tablet Take 1 tablet (750 mg total) by mouth daily. 05/20/16   [place], DO  meloxicam (MOBIC) 15 MG tablet Take 15 mg by mouth daily. Started on 03/05/16 X 30 days from [place] Provider, MD  Multiple Vitamin (MULTIVITAMIN WITH MINERALS) TABS tablet Take 1 tablet by mouth daily. 05/21/16   [place], DO  polyethylene glycol [facility]) packet Take 17 g by mouth daily as needed for mild constipation. 05/20/16   Kerney Elbe, DO    Family History Family History  Problem Relation Age of Onset  . Stroke Mother   . Hypertension Mother   . Cancer Mother     head and neck cancer and non hodgkin's lymphoma  . Cancer Father     colon/rectal and squamous cell carcinoma skin  . Cancer Paternal Uncle     lung  . Cancer Paternal Grandfather     lymphoma  . Cancer Maternal Aunt     colon cancer  . Cancer Maternal Grandfather     mesothelioma    Social History Social History  Substance Use Topics  . Smoking status: Former Smoker    Packs/day: 1.00    Years: 39.00    Types: Cigarettes    Quit date: 08/20/2015  . Smokeless tobacco: Never Used  . Alcohol use No     Allergies   Fentanyl and Morphine and related   Review of Systems Review of Systems  Unable to perform ROS: Mental status change  Constitutional: Positive for appetite change and fever.  Psychiatric/Behavioral: Positive for confusion.     Physical Exam Updated Vital Signs BP 107/74 (BP Location: Right Arm)   Pulse (!) 105   Temp 98.5 F (36.9 C) (Oral)   Resp 20   Ht 5' 0.4" (1.534 m)   Wt 109 lb 5.6 oz (49.6 kg)   SpO2 98%   BMI 21.07 kg/m   Physical Exam  Constitutional: She appears well-developed. No distress.  HENT:  Head: Normocephalic.  Eyes: Conjunctivae are normal. Pupils are equal, round, and reactive to light. No scleral icterus.  Neck: Normal range of motion. Neck supple. No thyromegaly present.  Cardiovascular: Normal rate and regular rhythm.  Exam reveals no gallop and no friction rub.   No murmur heard. Pulmonary/Chest: Effort normal  and breath sounds normal. No respiratory distress. She has no wheezes. She has no rales.  Abdominal: Soft. Bowel sounds are normal. She exhibits no distension. There is no tenderness. There is no rebound.  Musculoskeletal: Normal range of motion.  Neurological: She is alert.  Skin: Skin is warm and dry. No rash noted.  Psychiatric: She has a normal mood and affect. Her behavior is normal.     ED Treatments / Results  Labs (all labs ordered are listed, but only abnormal results are displayed) Labs Reviewed  RESPIRATORY PANEL BY PCR - Abnormal; Notable for the following:       Result Value   Coronavirus NL63 DETECTED (*)    All other components within normal limits  URINE CULTURE - Abnormal; Notable for the following:  Culture   (*)    Value: <10,000 COLONIES/mL INSIGNIFICANT GROWTH Performed at [facility]    All other components within normal limits  COMPREHENSIVE METABOLIC PANEL - Abnormal; Notable for the following:    Sodium 127 (*)    Chloride 97 (*)    CO2 21 (*)    Glucose, Bld 108 (*)    Calcium 7.9 (*)    Total Protein 5.6 (*)    Albumin 2.3 (*)    ALT 10 (*)    All other components within normal limits  CBC WITH DIFFERENTIAL/PLATELET - Abnormal; Notable for the following:    RBC 2.97 (*)    Hemoglobin 6.9 (*)    HCT 21.7 (*)    MCV 73.1 (*)    MCH 23.2 (*)    RDW 17.5 (*)    Lymphs Abs 0.2 (*)    All other components within normal limits  URINALYSIS, ROUTINE W REFLEX MICROSCOPIC - Abnormal; Notable for the following:    Ketones, ur 20 (*)    All other components within normal limits  BASIC METABOLIC PANEL - Abnormal; Notable for the following:    Sodium 130 (*)    Potassium 3.4 (*)    Chloride 97 (*)    CO2 21 (*)    Glucose, Bld 104 (*)    Calcium 7.8 (*)    All other components within normal limits  CBC - Abnormal; Notable for the following:    RBC 3.84 (*)    Hemoglobin 9.4 (*)    HCT 29.0 (*)    MCV 75.5 (*)    MCH 24.5 (*)    RDW 16.8  (*)    Platelets 140 (*)    All other components within normal limits  CBC WITH DIFFERENTIAL/PLATELET - Abnormal; Notable for the following:    WBC 2.4 (*)    RBC 3.48 (*)    Hemoglobin 8.3 (*)    HCT 26.2 (*)    MCV 75.3 (*)    MCH 23.9 (*)    RDW 16.8 (*)    Platelets 102 (*)    Lymphs Abs 0.2 (*)    All other components within normal limits  BASIC METABOLIC PANEL - Abnormal; Notable for the following:    Sodium 133 (*)    Glucose, Bld 120 (*)    Calcium 7.7 (*)    All other components within normal limits  CBC WITH DIFFERENTIAL/PLATELET - Abnormal; Notable for the following:    WBC 1.9 (*)    RBC 3.22 (*)    Hemoglobin 8.1 (*)    HCT 25.2 (*)    MCH 25.2 (*)    RDW 17.3 (*)    Platelets 73 (*)    Neutro Abs 1.5 (*)    Lymphs Abs 0.3 (*)    All other components within normal limits  BASIC METABOLIC PANEL - Abnormal; Notable for the following:    Sodium 132 (*)    Calcium 7.8 (*)    Anion gap 4 (*)    All other components within normal limits  CULTURE, BLOOD (ROUTINE X 2)  CULTURE, BLOOD (ROUTINE X 2)  URINE CULTURE  MRSA PCR SCREENING  CULTURE, EXPECTORATED SPUTUM-ASSESSMENT  PROCALCITONIN  TSH  VITAMIN B12  RPR  HIV ANTIBODY (ROUTINE TESTING)  STREP PNEUMONIAE URINARY ANTIGEN  LEGIONELLA PNEUMOPHILA SEROGP 1 UR AG  PROCALCITONIN  MAGNESIUM  I-STAT CG4 LACTIC ACID, ED  I-STAT CG4 LACTIC ACID, ED  TYPE AND SCREEN  PREPARE RBC (CROSSMATCH)  ABO/RH    EKG  EKG Interpretation  Date/Time:  Sunday May 17 2016 07:32:30 EST Ventricular Rate:  109 PR Interval:    QRS Duration: 81 QT Interval:  329 QTC Calculation: 443 R Axis:   90 Text Interpretation:  Right and left arm electrode reversal, interpretation assumes no reversal Sinus tachycardia Consider right atrial enlargement Borderline right axis deviation RSR' in V1 or V2, probably normal variant Nonspecific T abnormalities, lateral leads no significant change since july 2017 Confirmed by Regenia Skeeter MD,  SCOTT [phone]) on 05/18/2016 5:29:20 PM       Radiology No results found.  Procedures Procedures (including critical care time)  Medications Ordered in ED Medications  iopamidol (ISOVUE-300) 61 % injection (not administered)  sodium chloride 0.9 % bolus 1,000 mL (0 mLs Intravenous Stopped 05/17/16 0854)    And  sodium chloride 0.9 % bolus 500 mL (0 mLs Intravenous Stopped 05/17/16 0900)  piperacillin-tazobactam (ZOSYN) IVPB 3.375 g (0 g Intravenous Stopped 05/17/16 0854)  vancomycin (VANCOCIN) IVPB 1000 mg/200 mL premix (0 mg Intravenous Stopped 05/17/16 0937)  LORazepam (ATIVAN) injection 1 mg (1 mg Intravenous Given 05/17/16 1051)  oxyCODONE (Oxy IR/ROXICODONE) immediate release tablet 5 mg (5 mg Oral Given 05/17/16 1049)  haloperidol lactate (HALDOL) injection 1 mg (1 mg Intravenous Given 05/17/16 1205)  furosemide (LASIX) injection 30 mg (30 mg Intravenous Given 05/17/16 1814)  iopamidol (ISOVUE-300) 61 % injection 100 mL (100 mLs Intravenous Contrast Given 05/19/16 1230)     Initial Impression / Assessment and Plan / ED Course  I have reviewed the triage vital signs and the nursing notes.  Pertinent labs & imaging results that were available during my care of the patient were reviewed by me and considered in my medical decision making (see chart for details).     Confused. Adequate blood counts. After cultures obtained given vancomycin and Zosyn. No meningismus. Discussed with [facility] hospitalist. Patient be admitted. Chest x-ray does suggest infiltrate.  Final Clinical Impressions(s) / ED Diagnoses   Final diagnoses:  Community acquired pneumonia of left lower lobe of lung ([place])    New Prescriptions Discharge Medication List as of 05/20/2016  3:00 PM    START taking these medications   Details  feeding supplement, ENSURE ENLIVE, (ENSURE ENLIVE) LIQD Take 237 mLs by mouth 2 (two) times daily between meals., Starting Wed 05/20/2016, Normal    guaiFENesin (MUCINEX) 600 MG 12 hr  tablet Take 1 tablet (600 mg total) by mouth 2 (two) times daily., Starting Wed 05/20/2016, Normal    levofloxacin (LEVAQUIN) 750 MG tablet Take 1 tablet (750 mg total) by mouth daily., Starting Wed 05/20/2016, Normal    Multiple Vitamin (MULTIVITAMIN WITH MINERALS) TABS tablet Take 1 tablet by mouth daily., Starting Thu 05/21/2016, Normal         Tanna Furry, MD 05/22/16 1553

## 2016-05-25 ENCOUNTER — Telehealth: Payer: Self-pay | Admitting: *Deleted

## 2016-05-25 DIAGNOSIS — C3490 Malignant neoplasm of unspecified part of unspecified bronchus or lung: Secondary | ICD-10-CM

## 2016-05-25 MED ORDER — HYDROCODONE-ACETAMINOPHEN 5-325 MG PO TABS: 1.0000 | ORAL_TABLET | Freq: Four times a day (QID) | ORAL | 0 refills | Status: DC | PRN

## 2016-05-25 NOTE — Telephone Encounter (Signed)
Call from pt requesting pain med refill. Informed her script will be ready by noon. She will send her mom to pick it up.

## 2016-05-26 ENCOUNTER — Ambulatory Visit (INDEPENDENT_AMBULATORY_CARE_PROVIDER_SITE_OTHER): Payer: BLUE CROSS/BLUE SHIELD | Admitting: Family Medicine

## 2016-05-26 VITALS — BP 80/50 | HR 120 | Temp 98.1°F | Wt 94.3 lb

## 2016-05-26 DIAGNOSIS — G9340 Encephalopathy, unspecified: Secondary | ICD-10-CM | POA: Diagnosis not present

## 2016-05-26 DIAGNOSIS — C3490 Malignant neoplasm of unspecified part of unspecified bronchus or lung: Secondary | ICD-10-CM | POA: Diagnosis not present

## 2016-05-26 DIAGNOSIS — E871 Hypo-osmolality and hyponatremia: Secondary | ICD-10-CM | POA: Diagnosis not present

## 2016-05-26 DIAGNOSIS — E43 Unspecified severe protein-calorie malnutrition: Secondary | ICD-10-CM | POA: Diagnosis not present

## 2016-05-26 DIAGNOSIS — R531 Weakness: Secondary | ICD-10-CM

## 2016-05-26 NOTE — Progress Notes (Signed)
Pre visit review using our clinic review tool, if applicable. No additional management support is needed unless otherwise documented below in the visit note. 

## 2016-05-26 NOTE — Progress Notes (Signed)
Subjective:     Patient ID: Jenny [name], female   DOB: Sep 20, 1950, 66 y.o.   MRN: [id number]  HPI Patient seen for hospital follow up.  She is accompanied by brother who is down from [place] to assist caring for her.  Admitted 1/14 and d/c 05/20/16.  She has metastatic small cell lung cancer, hx of DVT, depression, anemia, COPD, protein calorie malnutrition who presented to ED with progressive dyspnea, weakness, poor oral intake, and altered mental status and fever.  She had been getting treatment for her lung cancer from [place] in [place] and recently transitioned care back here to [place].  Last chemotherapy treatment was 05-13-16.    Pt has positive respiratory panel for Coronavirus and HCAP.  Flu PCR negative. Blood cultures negative.  She had relatively mild hyponatremia on admission.  Improved with IVFs and antibiotics.  Was discharged home on Levaquin and has now finished out that.  No fever since discharge.  Intake variable but trying to eat three meals per day and Ensure.  Follow up Oncology tomorrow.    Her encephalopathy thought to be related to sepsis/acute illness.  Back to baseline now.  CT head no brain mets but positive skull mets.  B12 and TSH normal.  Received 2 units PRBCs.  D/C hgb 8.1.    Discharge Diagnoses:  Principal Problem:   Encephalopathy Active Problems:   DVT, lower extremity, recurrent (HCC)   COPD (chronic obstructive pulmonary disease) (HCC)   Small cell lung cancer (HCC)   Malnutrition of moderate degree   Anemia associated with chemotherapy   HCAP (healthcare-associated pneumonia)   Dehydration with hyponatremia   Pressure injury of skin   Protein-calorie malnutrition, severe   Altered mental status   Encephalopathy acute  Past Medical History:  Diagnosis Date  . DVT (deep venous thrombosis) ([place])    1 year ago  . History of chemotherapy   . Hx of radiation therapy   . Hypercholesteremia   . Lung cancer [facility])    Past Surgical  History:  Procedure Laterality Date  . CESAREAN SECTION    . ESOPHAGOGASTRODUODENOSCOPY (EGD) WITH PROPOFOL N/A 11/12/2015   Procedure: ESOPHAGOGASTRODUODENOSCOPY (EGD) WITH PROPOFOL;  Surgeon: Doran Stabler, MD;  Location: [facility] ENDOSCOPY;  Service: Endoscopy;  Laterality: N/A;  With propofol if available    reports that she quit smoking about 9 months ago. Her smoking use included Cigarettes. She has a 39.00 pack-year smoking history. She has never used smokeless tobacco. She reports that she uses drugs, including Benzodiazepines. She reports that she does not drink alcohol. family history includes Cancer in her father, maternal aunt, maternal grandfather, mother, paternal grandfather, and paternal uncle; Hypertension in her mother; Stroke in her mother. Allergies  Allergen Reactions  . Fentanyl     Patient prefers to never take this medication again. She doesn't like the way it makes her feel.   . Morphine And Related Itching and Rash       Review of Systems  Constitutional: Positive for fatigue. Negative for chills and fever.  Respiratory: Negative for cough.   Cardiovascular: Negative for chest pain, palpitations and leg swelling.  Gastrointestinal: Negative for abdominal pain, nausea and vomiting.  Genitourinary: Negative for dysuria.  Neurological: Positive for weakness. Negative for syncope and headaches.  Psychiatric/Behavioral: Negative for confusion.       Objective:   Physical Exam  Constitutional:  Alert thin cooperative 66 year old female.  HENT:  Mouth/Throat: Oropharynx is clear and  moist.  Neck: Neck supple.  Cardiovascular:  Initial HR up around 130 and decreased to 100 after transfer.  Regular rhythm.  Pulmonary/Chest: Effort normal and breath sounds normal. No respiratory distress. She has no wheezes. She has no rales.  Musculoskeletal: She exhibits no edema.  Neurological: She is alert.  Psychiatric: She has a normal mood and affect. Her behavior is  normal.       Assessment:     #1 metastatic small cell lung cancer.  Progressive over recent months.  She has declined significantly in recent months with declining weight and generalized weakness.  #2 HCAP- improved following antibiotics.  #3 positive Coronavirus by PCR-improved  #4 acute encephalopathy probably secondary to #2 and 3 above.  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  #8 recent mild hyponatremia.      Plan:     -follow up with Oncology tomorrow.  We elected not to do labs today as they are scheduled for tomorrow. -continue with Ensure supplements -We discussed ideas for healthy nutrient/protein intake -patient had questions about possible home health/ PT follow up.  With await discussions with Oncology first- in the event Hospice is recommended -follow up promptly for any fever, increased shortness of breath, or recurrent confusion.  Eulas Post MD [facility]

## 2016-05-26 NOTE — Patient Instructions (Signed)
Follow up immediately for any fever, increased shortness of breath, or confusion Continue with frequent meals and Ensure supplement

## 2016-05-27 ENCOUNTER — Ambulatory Visit: Payer: Self-pay | Admitting: Nurse Practitioner

## 2016-05-27 ENCOUNTER — Telehealth: Payer: Self-pay

## 2016-05-27 ENCOUNTER — Other Ambulatory Visit: Payer: Self-pay

## 2016-05-27 NOTE — Telephone Encounter (Signed)
Called pt to move to 05/28/16 at 66 d/t MD availability. Pt okay with plan and will be in at 1045 for lab work on 1/25.

## 2016-05-28 ENCOUNTER — Other Ambulatory Visit (HOSPITAL_BASED_OUTPATIENT_CLINIC_OR_DEPARTMENT_OTHER): Payer: BLUE CROSS/BLUE SHIELD

## 2016-05-28 ENCOUNTER — Ambulatory Visit (HOSPITAL_BASED_OUTPATIENT_CLINIC_OR_DEPARTMENT_OTHER): Payer: BLUE CROSS/BLUE SHIELD | Admitting: Nurse Practitioner

## 2016-05-28 VITALS — BP 93/60 | HR 64 | Temp 98.4°F | Resp 20 | Ht 60.4 in | Wt 95.8 lb

## 2016-05-28 DIAGNOSIS — C3492 Malignant neoplasm of unspecified part of left bronchus or lung: Secondary | ICD-10-CM

## 2016-05-28 DIAGNOSIS — D72819 Decreased white blood cell count, unspecified: Secondary | ICD-10-CM

## 2016-05-28 DIAGNOSIS — C3490 Malignant neoplasm of unspecified part of unspecified bronchus or lung: Secondary | ICD-10-CM

## 2016-05-28 DIAGNOSIS — C7951 Secondary malignant neoplasm of bone: Secondary | ICD-10-CM

## 2016-05-28 DIAGNOSIS — D6481 Anemia due to antineoplastic chemotherapy: Secondary | ICD-10-CM | POA: Diagnosis not present

## 2016-05-28 DIAGNOSIS — D701 Agranulocytosis secondary to cancer chemotherapy: Secondary | ICD-10-CM

## 2016-05-28 DIAGNOSIS — D630 Anemia in neoplastic disease: Secondary | ICD-10-CM | POA: Diagnosis not present

## 2016-05-28 DIAGNOSIS — D6959 Other secondary thrombocytopenia: Secondary | ICD-10-CM

## 2016-05-28 LAB — CBC WITH DIFFERENTIAL/PLATELET
BASO%: 1.2 % (ref 0.0–2.0)
BASOS ABS: 0 10*3/uL (ref 0.0–0.1)
EOS ABS: 0 10*3/uL (ref 0.0–0.5)
EOS%: 1.2 % (ref 0.0–7.0)
HCT: 33.7 % — ABNORMAL LOW (ref 34.8–46.6)
HEMOGLOBIN: 10.6 g/dL — AB (ref 11.6–15.9)
LYMPH%: 35.5 % (ref 14.0–49.7)
MCH: 24.8 pg — AB (ref 25.1–34.0)
MCHC: 31.5 g/dL (ref 31.5–36.0)
MCV: 78.7 fL — AB (ref 79.5–101.0)
MONO#: 0.7 10*3/uL (ref 0.1–0.9)
MONO%: 41 % — ABNORMAL HIGH (ref 0.0–14.0)
NEUT#: 0.4 10*3/uL — CL (ref 1.5–6.5)
NEUT%: 21.1 % — AB (ref 38.4–76.8)
PLATELETS: 165 10*3/uL (ref 145–400)
RBC: 4.28 10*6/uL (ref 3.70–5.45)
RDW: 19.6 % — ABNORMAL HIGH (ref 11.2–14.5)
WBC: 1.7 10*3/uL — ABNORMAL LOW (ref 3.9–10.3)
lymph#: 0.6 10*3/uL — ABNORMAL LOW (ref 0.9–3.3)
nRBC: 0 % (ref 0–0)

## 2016-05-28 NOTE — Progress Notes (Addendum)
[name] OFFICE PROGRESS NOTE   Diagnosis:  Small cell lung cancer  INTERVAL HISTORY:   Jenny [name] returns as scheduled. She completed cycle 1 carboplatin/etoposide beginning 05/11/2016. She was hospitalized 05/17/2016 through 05/20/2016 presenting with dyspnea, altered mental status and fever. She was treated for pneumonia. Respiratory panel returned positive for coronavirus. Altered mental status improved during the hospitalization.  She overall is feeling better. Appetite has improved. No significant shortness of breath. Occasional cough. No fever. No shaking chills. She reports scalp masses are stable. She is having intermittent right mid to upper back pain. She takes hydrocodone as needed.  Objective:  Vital signs in last 24 hours:  Blood pressure 93/60, pulse 64, temperature 98.4 F (36.9 C), temperature source Oral, resp. rate 20, height 5' 0.4" (1.534 m), weight 95 lb 12.8 oz (43.5 kg), SpO2 99 %. Repeat heart rate 120    HEENT: No thrush or ulcers.  Resp: Lungs clear bilaterally. Cardio: Regular, tachycardic. GI: Abdomen soft and nontender. No hepatomegaly. Vascular: No leg edema. Neuro: Alert and oriented. Musculoskeletal: Multiple scalp masses appear smaller. Mass right lower posterior ribs.     Lab Results:  Lab Results  Component Value Date   WBC 1.7 (L) 05/28/2016   HGB 10.6 (L) 05/28/2016   HCT 33.7 (L) 05/28/2016   MCV 78.7 (L) 05/28/2016   PLT 165 05/28/2016   NEUTROABS 0.4 (LL) 05/28/2016    Imaging:  No results found.  Medications: I have reviewed the patient's current medications.  Assessment/Plan: 1. Limited stage small cell lung cancer, diagnosed on biopsy of a subcarinal lymph node and left lung mass by EBUSon 09/12/2015 ? PET scan 09/03/2015 confirmed a hypermetabolic left perihilar mass, 2 left lower lobe pulmonary masses, and mediastinal lymphadenopathy, no evidence of distant metastatic disease ? Brain MRI negative for  metastatic disease 09/13/2015 ? Status post chest radiation 09/26/2015 through 10/18/2015  ? Cycle 1 cisplatin/etoposide beginning 09/26/2015 ? Cycle 2 cisplatin/etoposide beginning 10/16/2015 ? Cycle 3 carboplatin/etoposide beginning 11/06/2015 ? 12/17/2015 MRI of the brain negative ? 12/17/2015 CT scans chest/abdomen/pelvis (report currently not available) ? Bone scan 12/27/2015 with minimal increased uptake in the posterior aspect of the right 11th rib that does not appear abnormal on the CT images of August 2017; sclerotic focus noted on the previous CT scan in the body of L1 showed no abnormal uptake on the current bone scan; asymmetric increased uptake in the left SI joint region possibly corresponding to a subtle sclerotic focus in the medial posterior aspect of the left iliac bone ? CT chest 04/07/2016-new liver metastases, new upper abdominal adenopathy, left iliac metastasis, new right posterior lateral Pleural/chest wall metastasis ? Cycle 1 salvage chemotherapy with etoposide/carboplatin 05/11/2016  2. 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 Weight loss secondary to #2  4. History of febrile neutropenia following chemotherapy  5. Remote history of a right leg DVT-2008?-Maintained on Lovenox  6. History of tobacco use  7. Admission to [facility] 10/27/2015 with neutropenic fever, esophagitis, and dehydration  8. Hypercalcemia 05/01/2016-status post intravenous fluids and Zometa12/29/2017  9.   Fever prior to hospital admission 05/17/2016-infectious versus tumor fever; treated for pneumonia, respiratory panel positive for coronavirus  10.  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  Altered mental status-potentially related to infection/delirium, concern for brain metastases; brain CT 05/19/2016-no brain metastases, multiple  skull metastases; improved 05/28/2016.  12. Leukopenia/thrombocytopenia secondary to chemotherapy  Disposition: Jenny [name] appears improved. She has completed 1 cycle of carboplatin/etoposide. She was subsequently hospitalized with dyspnea, altered mental status and fever. She was treated for pneumonia. A respiratory panel returned positive for coronavirus. Mental status improved during the hospitalization.  She is neutropenic on labs today. She understands to contact the office with fever, chills, other signs of infection.  She will return for a follow-up visit and cycle 2 carboplatin/etoposide 06/08/2016. Neulasta will be added with cycle 2. She will contact the office prior to her next visit as outlined above or with any other problems.  Patient seen with Dr. Benay Spice. 25 minutes were spent face-to-face at today's visit with the majority of that time involved in counseling/coordination of care.    [place], [place] ANP/GNP-BC   05/28/2016  12:13 PM  This was a shared visit with Ned Card. Jenny [name] was interviewed and examined. Her perform status appears much improved and the altered mental status has resolved.the plan is to proceed with cycle 2 salvage chemotherapy on 06/08/2016.  She has neutropenia secondary to chemotherapy, the thrombocytopenia has resolved. She will contact us for a fever.  Julieanne Manson, M.D.

## 2016-05-29 NOTE — Addendum Note (Signed)
Addended by: Eulas Post on: 05/29/2016 09:03 AM   Modules accepted: Orders

## 2016-06-01 ENCOUNTER — Telehealth: Payer: Self-pay

## 2016-06-01 DIAGNOSIS — C3490 Malignant neoplasm of unspecified part of unspecified bronchus or lung: Secondary | ICD-10-CM

## 2016-06-01 NOTE — Telephone Encounter (Signed)
Pt called stating she saw Lattie Haw on 1/25 and was told to call Monday if diarrhea persists. Called pt back: she had 2 stools yesterday, and one in the night, occasionally incontinent, still watery, large quantities. She has had 2 today as well. She has not used immodium.  S/w Lattie Haw and will have pt come in for c-diff testing, no need for OV, just lab.  Pt stated she wants to come in as early as possible tomorrow so she can arrange a ride. She felt she would be able to have BM while here. In basket sent.

## 2016-06-02 ENCOUNTER — Other Ambulatory Visit (HOSPITAL_BASED_OUTPATIENT_CLINIC_OR_DEPARTMENT_OTHER): Payer: BLUE CROSS/BLUE SHIELD

## 2016-06-02 ENCOUNTER — Other Ambulatory Visit (HOSPITAL_COMMUNITY)
Admission: RE | Admit: 2016-06-02 | Discharge: 2016-06-02 | Disposition: A | Discharge status: Home / Self Care | Payer: BLUE CROSS/BLUE SHIELD | Source: Ambulatory Visit | Attending: Oncology | Admitting: Oncology

## 2016-06-02 ENCOUNTER — Telehealth: Payer: Self-pay | Admitting: *Deleted

## 2016-06-02 DIAGNOSIS — I82409 Acute embolism and thrombosis of unspecified deep veins of unspecified lower extremity: Secondary | ICD-10-CM | POA: Insufficient documentation

## 2016-06-02 DIAGNOSIS — C3490 Malignant neoplasm of unspecified part of unspecified bronchus or lung: Secondary | ICD-10-CM

## 2016-06-02 LAB — CBC WITH DIFFERENTIAL/PLATELET
BASO%: 0.9 % (ref 0.0–2.0)
Basophils Absolute: 0 10*3/uL (ref 0.0–0.1)
EOS%: 1.2 % (ref 0.0–7.0)
Eosinophils Absolute: 0 10*3/uL (ref 0.0–0.5)
HEMATOCRIT: 34.2 % — AB (ref 34.8–46.6)
HGB: 10.5 g/dL — ABNORMAL LOW (ref 11.6–15.9)
LYMPH%: 21.4 % (ref 14.0–49.7)
MCH: 25.1 pg (ref 25.1–34.0)
MCHC: 30.7 g/dL — AB (ref 31.5–36.0)
MCV: 81.8 fL (ref 79.5–101.0)
MONO#: 0.8 10*3/uL (ref 0.1–0.9)
MONO%: 24.6 % — ABNORMAL HIGH (ref 0.0–14.0)
NEUT#: 1.8 10*3/uL (ref 1.5–6.5)
NEUT%: 51.9 % (ref 38.4–76.8)
Platelets: 273 10*3/uL (ref 145–400)
RBC: 4.18 10*6/uL (ref 3.70–5.45)
RDW: 20.3 % — ABNORMAL HIGH (ref 11.2–14.5)
WBC: 3.4 10*3/uL — ABNORMAL LOW (ref 3.9–10.3)
lymph#: 0.7 10*3/uL — ABNORMAL LOW (ref 0.9–3.3)
nRBC: 0 % (ref 0–0)

## 2016-06-02 LAB — COMPREHENSIVE METABOLIC PANEL
ALT: 17 U/L (ref 0–55)
ANION GAP: 9 meq/L (ref 3–11)
AST: 21 U/L (ref 5–34)
Albumin: 3.2 g/dL — ABNORMAL LOW (ref 3.5–5.0)
Alkaline Phosphatase: 238 U/L — ABNORMAL HIGH (ref 40–150)
BUN: 20.9 mg/dL (ref 7.0–26.0)
CALCIUM: 9.1 mg/dL (ref 8.4–10.4)
CHLORIDE: 106 meq/L (ref 98–109)
CO2: 25 mEq/L (ref 22–29)
Creatinine: 0.7 mg/dL (ref 0.6–1.1)
EGFR: >90 mL/min/{1.73_m2} (ref >90)
Glucose: 101 mg/dl (ref 70–140)
POTASSIUM: 4.4 meq/L (ref 3.5–5.1)
Sodium: 140 mEq/L (ref 136–145)
Total Bilirubin: 0.24 mg/dL (ref 0.20–1.20)
Total Protein: 6.7 g/dL (ref 6.4–8.3)

## 2016-06-02 NOTE — Telephone Encounter (Signed)
Call from lab, stool specimen was rejected- it was too formed. Pt notified, she will submit another sample if symptoms return.  Will make Dr. Benay Spice aware.

## 2016-06-08 ENCOUNTER — Other Ambulatory Visit: Payer: Self-pay | Admitting: *Deleted

## 2016-06-08 ENCOUNTER — Telehealth: Payer: Self-pay | Admitting: Oncology

## 2016-06-08 DIAGNOSIS — C3490 Malignant neoplasm of unspecified part of unspecified bronchus or lung: Secondary | ICD-10-CM

## 2016-06-08 NOTE — Telephone Encounter (Signed)
Spoke with patient re appointments 2/6 thru 2/8. Per patient cxd injection due to she was told she would get onpro.

## 2016-06-09 ENCOUNTER — Other Ambulatory Visit: Payer: Self-pay | Admitting: *Deleted

## 2016-06-09 ENCOUNTER — Other Ambulatory Visit (HOSPITAL_BASED_OUTPATIENT_CLINIC_OR_DEPARTMENT_OTHER): Payer: BLUE CROSS/BLUE SHIELD

## 2016-06-09 ENCOUNTER — Ambulatory Visit (HOSPITAL_BASED_OUTPATIENT_CLINIC_OR_DEPARTMENT_OTHER): Payer: BLUE CROSS/BLUE SHIELD | Admitting: Oncology

## 2016-06-09 ENCOUNTER — Ambulatory Visit (HOSPITAL_BASED_OUTPATIENT_CLINIC_OR_DEPARTMENT_OTHER): Payer: BLUE CROSS/BLUE SHIELD

## 2016-06-09 VITALS — BP 100/76 | HR 138 | Temp 98.7°F | Resp 18 | Ht 60.0 in | Wt 98.1 lb

## 2016-06-09 VITALS — BP 118/77 | HR 118 | Temp 98.1°F | Resp 20

## 2016-06-09 DIAGNOSIS — C7951 Secondary malignant neoplasm of bone: Secondary | ICD-10-CM

## 2016-06-09 DIAGNOSIS — D630 Anemia in neoplastic disease: Secondary | ICD-10-CM

## 2016-06-09 DIAGNOSIS — C3490 Malignant neoplasm of unspecified part of unspecified bronchus or lung: Secondary | ICD-10-CM

## 2016-06-09 DIAGNOSIS — Z5111 Encounter for antineoplastic chemotherapy: Secondary | ICD-10-CM | POA: Diagnosis not present

## 2016-06-09 DIAGNOSIS — D6481 Anemia due to antineoplastic chemotherapy: Secondary | ICD-10-CM

## 2016-06-09 DIAGNOSIS — C3492 Malignant neoplasm of unspecified part of left bronchus or lung: Secondary | ICD-10-CM

## 2016-06-09 LAB — COMPREHENSIVE METABOLIC PANEL
ALBUMIN: 3.3 g/dL — AB (ref 3.5–5.0)
ALK PHOS: 240 U/L — AB (ref 40–150)
ALT: 22 U/L (ref 0–55)
AST: 27 U/L (ref 5–34)
Anion Gap: 11 mEq/L (ref 3–11)
BILIRUBIN TOTAL: 0.52 mg/dL (ref 0.20–1.20)
BUN: 12.4 mg/dL (ref 7.0–26.0)
CO2: 21 meq/L — AB (ref 22–29)
CREATININE: 0.7 mg/dL (ref 0.6–1.1)
Calcium: 9.7 mg/dL (ref 8.4–10.4)
Chloride: 102 mEq/L (ref 98–109)
GLUCOSE: 129 mg/dL (ref 70–140)
Potassium: 4.5 mEq/L (ref 3.5–5.1)
SODIUM: 134 meq/L — AB (ref 136–145)
TOTAL PROTEIN: 7.2 g/dL (ref 6.4–8.3)

## 2016-06-09 LAB — CBC WITH DIFFERENTIAL/PLATELET
BASO%: 0.1 % (ref 0.0–2.0)
Basophils Absolute: 0 10*3/uL (ref 0.0–0.1)
EOS ABS: 0 10*3/uL (ref 0.0–0.5)
EOS%: 0.1 % (ref 0.0–7.0)
HCT: 36 % (ref 34.8–46.6)
HGB: 11 g/dL — ABNORMAL LOW (ref 11.6–15.9)
LYMPH%: 5.7 % — AB (ref 14.0–49.7)
MCH: 25.6 pg (ref 25.1–34.0)
MCHC: 30.6 g/dL — ABNORMAL LOW (ref 31.5–36.0)
MCV: 83.9 fL (ref 79.5–101.0)
MONO#: 2.3 10*3/uL — AB (ref 0.1–0.9)
MONO%: 20 % — AB (ref 0.0–14.0)
NEUT%: 74.1 % (ref 38.4–76.8)
NEUTROS ABS: 8.5 10*3/uL — AB (ref 1.5–6.5)
Platelets: 332 10*3/uL (ref 145–400)
RBC: 4.29 10*6/uL (ref 3.70–5.45)
RDW: 21.3 % — AB (ref 11.2–14.5)
WBC: 11.4 10*3/uL — AB (ref 3.9–10.3)
lymph#: 0.7 10*3/uL — ABNORMAL LOW (ref 0.9–3.3)

## 2016-06-09 MED ORDER — SODIUM CHLORIDE 0.9 % IV SOLN
75.0000 mg/m2 | Freq: Once | INTRAVENOUS | Status: AC
  Administered 2016-06-09: 110 mg via INTRAVENOUS
  Filled 2016-06-09: qty 5.5

## 2016-06-09 MED ORDER — PALONOSETRON HCL INJECTION 0.25 MG/5ML
0.2500 mg | Freq: Once | INTRAVENOUS | Status: AC
  Administered 2016-06-09: 0.25 mg via INTRAVENOUS

## 2016-06-09 MED ORDER — SODIUM CHLORIDE 0.9 % IV SOLN: Freq: Once | INTRAVENOUS | Status: DC

## 2016-06-09 MED ORDER — SODIUM CHLORIDE 0.9 % IV SOLN
306.8000 mg | Freq: Once | INTRAVENOUS | Status: AC
  Administered 2016-06-09: 310 mg via INTRAVENOUS
  Filled 2016-06-09: qty 31

## 2016-06-09 MED ORDER — SODIUM CHLORIDE 0.9 % IV SOLN
INTRAVENOUS | Status: DC
  Administered 2016-06-09: 12:00:00 via INTRAVENOUS

## 2016-06-09 MED ORDER — DEXAMETHASONE SODIUM PHOSPHATE 10 MG/ML IJ SOLN
10.0000 mg | Freq: Once | INTRAMUSCULAR | Status: AC
  Administered 2016-06-09: 10 mg via INTRAVENOUS

## 2016-06-09 MED ORDER — HYDROCODONE-ACETAMINOPHEN 5-325 MG PO TABS: 1.0000 | ORAL_TABLET | ORAL | 0 refills | Status: DC | PRN

## 2016-06-09 MED ORDER — SODIUM CHLORIDE 0.9 % IV SOLN: INTRAVENOUS | Status: DC

## 2016-06-09 MED ORDER — ALPRAZOLAM 0.25 MG PO TABS: 0.2500 mg | ORAL_TABLET | Freq: Every day | ORAL | 0 refills | Status: DC | PRN

## 2016-06-09 NOTE — Progress Notes (Signed)
Pt HR elevated in the 140's. Obtained IVF orders for pt to receive 500ns over 30 min. Rechecked vs and hr down to 110's. Pt to receive an additional 500 ns with chemo today.

## 2016-06-09 NOTE — Progress Notes (Signed)
[facility] OFFICE PROGRESS NOTE   Diagnosis: Small cell lung cancer  INTERVAL HISTORY:   Jenny [name] returns as scheduled. She continues to have pain in the bilateral "hip "area and right posterior chest wall. She takes hydrocodone 3-4 times per day for relief of pain. She reports a good appetite. The scalp masses have not changed. She has exertional dyspnea.  Objective:  Vital signs in last 24 hours:  Blood pressure 100/76, pulse (!) 138, temperature 98.7 F (37.1 C), temperature source Oral, resp. rate 18, height 5' (1.524 m), weight 98 lb 1.6 oz (44.5 kg), SpO2 96 %.    HEENT: No thrush or ulcers Resp: Good air movement bilaterally, bronchial sounds at the left posterior chest, no respiratory distress Cardio: Tachycardia, regular rate and rhythm GI: No hepatomegaly, nontender Vascular: No leg edema Musculoskeletal: Firm 4-5 centimeter mass at the right mid posterior chest wall  Skin: Multiple soft scalp masses    Lab Results:  Lab Results  Component Value Date   WBC 11.4 (H) 06/09/2016   HGB 11.0 (L) 06/09/2016   HCT 36.0 06/09/2016   MCV 83.9 06/09/2016   PLT 332 06/09/2016   NEUTROABS 8.5 (H) 06/09/2016     Medications: I have reviewed the patient's current medications.  Assessment/Plan: 1. Limited stage small cell lung cancer, diagnosed on biopsy of a subcarinal lymph node and left lung mass by EBUSon 09/12/2015 ? PET scan 09/03/2015 confirmed a hypermetabolic left perihilar mass, 2 left lower lobe pulmonary masses, and mediastinal lymphadenopathy, no evidence of distant metastatic disease ? Brain MRI negative for metastatic disease 09/13/2015 ? Status post chest radiation 09/26/2015 through 10/18/2015  ? Cycle 1 cisplatin/etoposide beginning 09/26/2015 ? Cycle 2 cisplatin/etoposide beginning 10/16/2015 ? Cycle 3 carboplatin/etoposide beginning 11/06/2015 ? 12/17/2015 MRI of the brain negative ? 12/17/2015 CT scans chest/abdomen/pelvis (report  currently not available) ? Bone scan 12/27/2015 with minimal increased uptake in the posterior aspect of the right 11th rib that does not appear abnormal on the CT images of August 2017; sclerotic focus noted on the previous CT scan in the body of L1 showed no abnormal uptake on the current bone scan; asymmetric increased uptake in the left SI joint region possibly corresponding to a subtle sclerotic focus in the medial posterior aspect of the left iliac bone ? CT chest 04/07/2016-new liver metastases, new upper abdominal adenopathy, left iliac metastasis, new right posterior lateral Pleural/chest wall metastasis ? Cycle 1 salvage chemotherapy with etoposide/carboplatin01/12/2016 ? Cycle 2 salvage chemotherapy with etoposide/carboplatin02/10/2016-Neulasta added  2. 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 Weight loss secondary to #2  4. History of febrile neutropenia following chemotherapy  5. Remote history of a right leg DVT-2008?-Maintained on Lovenox  6. History of tobacco use  7. Admission to [facility] 10/27/2015 with neutropenic fever, esophagitis, and dehydration  8. Hypercalcemia 05/01/2016-status post intravenous fluids and Zometa12/29/2017  9. Fever prior to hospital admission 05/17/2016-infectious versus tumor fever; treated for pneumonia, respiratory panel positive for coronavirus  10. 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 Altered mental status-potentially related to infection/delirium, concern for brain metastases; brain CT 05/19/2016-no brain metastases, multiple skull metastases; improved 05/28/2016.  12. History of Leukopenia/thrombocytopenia secondary to chemotherapy-Neulasta will be added with cycle 2 etoposide/carboplatin   Disposition:  Jenny [name] presents for cycle 2 etoposide/carboplatin. Her overall  performance status has improved compared to 3 salvage chemotherapy last month. Out masses and right posterior chest  wall mass appear unchanged. Her appetite and pain have partially improved. We decided to proceed with cycle 2 chemotherapy today. She will receive Neulasta with this cycle of chemotherapy.  She has tachycardia today. She has a history of tachycardia on repeat visits to the [place]. We will administer intravenous fluids and repeat vital signs prior to chemotherapy today. We will delay the next cycle of chemotherapy if the tachycardia does not improve.  Jenny [name] will return for an office visit and nadir CBC in 2 weeks.  25 minutes were spent with the patient today. The majority of the time was used for counseling and correlation of care.  Betsy Coder, MD  06/09/2016  2:01 PM

## 2016-06-10 ENCOUNTER — Telehealth: Payer: Self-pay | Admitting: Family Medicine

## 2016-06-10 ENCOUNTER — Ambulatory Visit (HOSPITAL_BASED_OUTPATIENT_CLINIC_OR_DEPARTMENT_OTHER): Payer: BLUE CROSS/BLUE SHIELD

## 2016-06-10 VITALS — BP 111/73 | HR 98 | Temp 98.2°F | Resp 18

## 2016-06-10 DIAGNOSIS — C3490 Malignant neoplasm of unspecified part of unspecified bronchus or lung: Secondary | ICD-10-CM | POA: Diagnosis not present

## 2016-06-10 DIAGNOSIS — Z5111 Encounter for antineoplastic chemotherapy: Secondary | ICD-10-CM

## 2016-06-10 DIAGNOSIS — C7951 Secondary malignant neoplasm of bone: Secondary | ICD-10-CM

## 2016-06-10 MED ORDER — ENOXAPARIN SODIUM 80 MG/0.8ML ~~LOC~~ SOLN: 1.5000 mg/kg | SUBCUTANEOUS | 5 refills | Status: AC

## 2016-06-10 MED ORDER — SODIUM CHLORIDE 0.9 % IV SOLN
Freq: Once | INTRAVENOUS | Status: AC
  Administered 2016-06-10: 14:00:00 via INTRAVENOUS

## 2016-06-10 MED ORDER — SODIUM CHLORIDE 0.9 % IV SOLN
75.0000 mg/m2 | Freq: Once | INTRAVENOUS | Status: AC
  Administered 2016-06-10: 110 mg via INTRAVENOUS
  Filled 2016-06-10: qty 5.5

## 2016-06-10 MED ORDER — DEXAMETHASONE SODIUM PHOSPHATE 10 MG/ML IJ SOLN
10.0000 mg | Freq: Once | INTRAMUSCULAR | Status: AC
  Administered 2016-06-10: 10 mg via INTRAVENOUS

## 2016-06-10 NOTE — Patient Instructions (Signed)
[facility] Discharge Instructions for Patients Receiving Chemotherapy  Today you received the following chemotherapy agents: Etoposide   To help prevent nausea and vomiting after your treatment, we encourage you to take your nausea medication as directed.    If you develop nausea and vomiting that is not controlled by your nausea medication, call the clinic.   BELOW ARE SYMPTOMS THAT SHOULD BE REPORTED IMMEDIATELY:  *FEVER GREATER THAN 100.5 F  *CHILLS WITH OR WITHOUT FEVER  NAUSEA AND VOMITING THAT IS NOT CONTROLLED WITH YOUR NAUSEA MEDICATION  *UNUSUAL SHORTNESS OF BREATH  *UNUSUAL BRUISING OR BLEEDING  TENDERNESS IN MOUTH AND THROAT WITH OR WITHOUT PRESENCE OF ULCERS  *URINARY PROBLEMS  *BOWEL PROBLEMS  UNUSUAL RASH Items with * indicate a potential emergency and should be followed up as soon as possible.  Feel free to call the clinic you have any questions or concerns. The clinic phone number is [phone].  Please show the CHEMO ALERT CARD at check-in to the Emergency Department and triage nurse.   

## 2016-06-10 NOTE — Telephone Encounter (Signed)
Pt forgot to ask Dr Elease Hashimoto for a refill of the enoxaparin (LOVENOX) 80 MG/0.8ML injection 1 /day  [facility]/ [address]  Pt was sent home form the hospital and was sent home at a lower dosage than what Dr Elease Hashimoto had her on.

## 2016-06-10 NOTE — Telephone Encounter (Signed)
Medication sent in for patient. 

## 2016-06-11 ENCOUNTER — Ambulatory Visit (HOSPITAL_BASED_OUTPATIENT_CLINIC_OR_DEPARTMENT_OTHER): Payer: BLUE CROSS/BLUE SHIELD

## 2016-06-11 VITALS — BP 115/69 | HR 117 | Temp 98.6°F | Resp 16

## 2016-06-11 DIAGNOSIS — C3490 Malignant neoplasm of unspecified part of unspecified bronchus or lung: Secondary | ICD-10-CM

## 2016-06-11 DIAGNOSIS — Z5189 Encounter for other specified aftercare: Secondary | ICD-10-CM

## 2016-06-11 DIAGNOSIS — Z5111 Encounter for antineoplastic chemotherapy: Secondary | ICD-10-CM

## 2016-06-11 MED ORDER — SODIUM CHLORIDE 0.9 % IV SOLN
Freq: Once | INTRAVENOUS | Status: AC
  Administered 2016-06-11: 14:00:00 via INTRAVENOUS

## 2016-06-11 MED ORDER — DEXAMETHASONE SODIUM PHOSPHATE 10 MG/ML IJ SOLN
10.0000 mg | Freq: Once | INTRAMUSCULAR | Status: AC
  Administered 2016-06-11: 10 mg via INTRAVENOUS

## 2016-06-11 MED ORDER — SODIUM CHLORIDE 0.9 % IV SOLN
75.0000 mg/m2 | Freq: Once | INTRAVENOUS | Status: AC
  Administered 2016-06-11: 110 mg via INTRAVENOUS
  Filled 2016-06-11: qty 5.5

## 2016-06-11 MED ORDER — PEGFILGRASTIM 6 MG/0.6ML ~~LOC~~ PSKT
6.0000 mg | PREFILLED_SYRINGE | Freq: Once | SUBCUTANEOUS | Status: AC
  Administered 2016-06-11: 6 mg via SUBCUTANEOUS
  Filled 2016-06-11: qty 0.6

## 2016-06-11 NOTE — Patient Instructions (Signed)
[facility] Discharge Instructions for Patients Receiving Chemotherapy  Today you received the following chemotherapy agents Etoposide.   To help prevent nausea and vomiting after your treatment, we encourage you to take your nausea medication as prescribed.   If you develop nausea and vomiting that is not controlled by your nausea medication, call the clinic.   BELOW ARE SYMPTOMS THAT SHOULD BE REPORTED IMMEDIATELY:  *FEVER GREATER THAN 100.5 F  *CHILLS WITH OR WITHOUT FEVER  NAUSEA AND VOMITING THAT IS NOT CONTROLLED WITH YOUR NAUSEA MEDICATION  *UNUSUAL SHORTNESS OF BREATH  *UNUSUAL BRUISING OR BLEEDING  TENDERNESS IN MOUTH AND THROAT WITH OR WITHOUT PRESENCE OF ULCERS  *URINARY PROBLEMS  *BOWEL PROBLEMS  UNUSUAL RASH Items with * indicate a potential emergency and should be followed up as soon as possible.  Feel free to call the clinic you have any questions or concerns. The clinic phone number is [phone].  Please show the CHEMO ALERT CARD at check-in to the Emergency Department and triage nurse.   

## 2016-06-13 ENCOUNTER — Ambulatory Visit: Payer: Self-pay

## 2016-06-16 ENCOUNTER — Telehealth: Payer: Self-pay

## 2016-06-16 NOTE — Telephone Encounter (Signed)
Patient called wanting to make sure it was okay to take imodium or her diarrhea. Called patient back and informed her she could take imodium as directed and to call if it worsens or persists. Pt verbalized understanding

## 2016-06-22 ENCOUNTER — Ambulatory Visit (HOSPITAL_BASED_OUTPATIENT_CLINIC_OR_DEPARTMENT_OTHER): Payer: BLUE CROSS/BLUE SHIELD | Admitting: Nurse Practitioner

## 2016-06-22 ENCOUNTER — Telehealth: Payer: Self-pay | Admitting: Nurse Practitioner

## 2016-06-22 ENCOUNTER — Other Ambulatory Visit (HOSPITAL_BASED_OUTPATIENT_CLINIC_OR_DEPARTMENT_OTHER): Payer: BLUE CROSS/BLUE SHIELD

## 2016-06-22 VITALS — BP 110/73 | HR 122 | Temp 98.4°F | Resp 16 | Ht 60.0 in | Wt 100.4 lb

## 2016-06-22 DIAGNOSIS — C3491 Malignant neoplasm of unspecified part of right bronchus or lung: Secondary | ICD-10-CM

## 2016-06-22 DIAGNOSIS — C3492 Malignant neoplasm of unspecified part of left bronchus or lung: Secondary | ICD-10-CM | POA: Diagnosis not present

## 2016-06-22 DIAGNOSIS — C7951 Secondary malignant neoplasm of bone: Secondary | ICD-10-CM | POA: Diagnosis not present

## 2016-06-22 DIAGNOSIS — C3490 Malignant neoplasm of unspecified part of unspecified bronchus or lung: Secondary | ICD-10-CM

## 2016-06-22 DIAGNOSIS — D6481 Anemia due to antineoplastic chemotherapy: Secondary | ICD-10-CM

## 2016-06-22 DIAGNOSIS — D630 Anemia in neoplastic disease: Secondary | ICD-10-CM | POA: Diagnosis not present

## 2016-06-22 LAB — CBC WITH DIFFERENTIAL/PLATELET
BASO%: 0.5 % (ref 0.0–2.0)
Basophils Absolute: 0.1 10*3/uL (ref 0.0–0.1)
EOS%: 0.5 % (ref 0.0–7.0)
Eosinophils Absolute: 0.1 10*3/uL (ref 0.0–0.5)
HEMATOCRIT: 30.5 % — AB (ref 34.8–46.6)
HEMOGLOBIN: 9.7 g/dL — AB (ref 11.6–15.9)
LYMPH#: 0.7 10*3/uL — AB (ref 0.9–3.3)
LYMPH%: 6.1 % — ABNORMAL LOW (ref 14.0–49.7)
MCH: 26.1 pg (ref 25.1–34.0)
MCHC: 31.9 g/dL (ref 31.5–36.0)
MCV: 81.9 fL (ref 79.5–101.0)
MONO#: 1 10*3/uL — ABNORMAL HIGH (ref 0.1–0.9)
MONO%: 8.3 % (ref 0.0–14.0)
NEUT%: 84.6 % — AB (ref 38.4–76.8)
NEUTROS ABS: 10.2 10*3/uL — AB (ref 1.5–6.5)
Platelets: 147 10*3/uL (ref 145–400)
RBC: 3.72 10*6/uL (ref 3.70–5.45)
RDW: 24.2 % — AB (ref 11.2–14.5)
WBC: 12 10*3/uL — AB (ref 3.9–10.3)

## 2016-06-22 NOTE — Telephone Encounter (Signed)
Appointments scheduled per 2/19 LOS. Patient given AVS report and calendars with future scheduled appointments.

## 2016-06-22 NOTE — Telephone Encounter (Signed)
Called patient to inform her of infusion appointments scheduled after she left scheduling. Spoke with patient and mailed out new calendars with future scheduled appointments.

## 2016-06-22 NOTE — Progress Notes (Addendum)
[facility] OFFICE PROGRESS NOTE   Diagnosis:  Small cell lung cancer  INTERVAL HISTORY:   Jenny [name] returns as scheduled. She completed cycle 2 carboplatin/etoposide beginning 06/09/2016. She denies nausea/vomiting. No mouth sores. She had loose stools over 1 day. She has stable dyspnea on exertion. She coughs periodically. She continues to have right-sided back pain. She takes hydrocodone as needed. She thinks the scalp masses are smaller. She reports a good appetite. She can intermittently hear her heartbeat in her left ear. She continues Lovenox. She denies bleeding.  Objective:  Vital signs in last 24 hours:  Blood pressure 110/73, pulse (!) 122, temperature 98.4 F (36.9 C), temperature source Oral, resp. rate 16, height 5' (1.524 m), weight 100 lb 6.4 oz (45.5 kg), SpO2 100 %.    HEENT: No thrush or ulcers. Resp: Lungs clear bilaterally. Cardio: Regular, tachycardic. GI: Abdomen soft and nontender. No hepatomegaly. Vascular: No leg edema. Neuro: Alert and oriented. Musculoskeletal: 4-5 cm mass right mid posterior chest wall.  Skin: Multiple scalp masses.    Lab Results:  Lab Results  Component Value Date   WBC 12.0 (H) 06/22/2016   HGB 9.7 (L) 06/22/2016   HCT 30.5 (L) 06/22/2016   MCV 81.9 06/22/2016   PLT 147 06/22/2016   NEUTROABS 10.2 (H) 06/22/2016    Imaging:  No results found.  Medications: I have reviewed the patient's current medications.  Assessment/Plan: 1. Limited stage small cell lung cancer, diagnosed on biopsy of a subcarinal lymph node and left lung mass by EBUSon 09/12/2015 ? PET scan 09/03/2015 confirmed a hypermetabolic left perihilar mass, 2 left lower lobe pulmonary masses, and mediastinal lymphadenopathy, no evidence of distant metastatic disease ? Brain MRI negative for metastatic disease 09/13/2015 ? Status post chest radiation 09/26/2015 through 10/18/2015  ? Cycle 1 cisplatin/etoposide beginning 09/26/2015 ? Cycle  2 cisplatin/etoposide beginning 10/16/2015 ? Cycle 3 carboplatin/etoposide beginning 11/06/2015 ? 12/17/2015 MRI of the brain negative ? 12/17/2015 CT scans chest/abdomen/pelvis (report currently not available) ? Bone scan 12/27/2015 with minimal increased uptake in the posterior aspect of the right 11th rib that does not appear abnormal on the CT images of August 2017; sclerotic focus noted on the previous CT scan in the body of L1 showed no abnormal uptake on the current bone scan; asymmetric increased uptake in the left SI joint region possibly corresponding to a subtle sclerotic focus in the medial posterior aspect of the left iliac bone ? CT chest 04/07/2016-new liver metastases, new upper abdominal adenopathy, left iliac metastasis, new right posterior lateral Pleural/chest wall metastasis ? Cycle 1 salvage chemotherapy with etoposide/carboplatin01/12/2016 ? Cycle 2 salvage chemotherapy with etoposide/carboplatin 06/09/2016-Neulasta added  2. 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 Weight loss secondary to #2  4. History of febrile neutropenia following chemotherapy  5. Remote history of a right leg DVT-2008?-Maintained on Lovenox  6. History of tobacco use  7. Admission to [facility] 10/27/2015 with neutropenic fever, esophagitis, and dehydration  8. Hypercalcemia 05/01/2016-status post intravenous fluids and Zometa12/29/2017  9. Fever prior to hospital admission 05/17/2016-infectious versus tumor fever; treated for pneumonia, respiratory panel positive for coronavirus  10. 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 Altered mental status-potentially related to infection/delirium, concern for brain metastases; brain CT 05/19/2016-no brain metastases, multiple skull metastases; improved 05/28/2016, 06/22/2016.  12. History of  Leukopenia/thrombocytopenia secondary to chemotherapy-Neulasta will be added with cycle 2 etoposide/carboplatin  Disposition: Jenny [name] appears stable. She has completed 2 cycles of carboplatin/etoposide. Her performance status is better and the scalp masses appear somewhat smaller. Dr. Benay Spice recommends restaging CT scans just prior to her next visit/cycle 3 carboplatin/etoposide in 2 weeks. We will request the CT scans to be done on 07/06/2016. She will return for a follow-up visit on 07/07/2016. She will contact the office in the interim with any problems.  Patient seen with Dr. Benay Spice.    Jenny [name], Jenny [name] ANP/GNP-BC   06/22/2016  12:16 PM  This was a shared visit with Ned Card. Jenny [name] was interviewed and examined. Her performance status has improved following 2 cycles of etoposide/carboplatin. She will undergo a restaging CT prior to the next cycle. The palpable scalp lesions appear smaller.  Julieanne Manson, M.D.

## 2016-06-26 ENCOUNTER — Telehealth: Payer: Self-pay | Admitting: Family Medicine

## 2016-06-26 NOTE — Telephone Encounter (Signed)
Jenny [name] is aware via voicemail of verbal orders.

## 2016-06-26 NOTE — Telephone Encounter (Signed)
Pt missed her home PT today verbal order to extend until next week.

## 2016-06-26 NOTE — Telephone Encounter (Signed)
OK 

## 2016-06-26 NOTE — Telephone Encounter (Signed)
Okay 

## 2016-07-02 ENCOUNTER — Ambulatory Visit (HOSPITAL_BASED_OUTPATIENT_CLINIC_OR_DEPARTMENT_OTHER): Payer: BLUE CROSS/BLUE SHIELD | Admitting: Nurse Practitioner

## 2016-07-02 ENCOUNTER — Telehealth: Payer: Self-pay | Admitting: *Deleted

## 2016-07-02 ENCOUNTER — Ambulatory Visit (HOSPITAL_COMMUNITY): Admission: RE | Admit: 2016-07-02 | Payer: BLUE CROSS/BLUE SHIELD | Source: Ambulatory Visit

## 2016-07-02 VITALS — BP 93/63 | HR 135 | Temp 98.3°F | Resp 18 | Ht 60.0 in | Wt 102.0 lb

## 2016-07-02 DIAGNOSIS — G893 Neoplasm related pain (acute) (chronic): Secondary | ICD-10-CM

## 2016-07-02 DIAGNOSIS — C7951 Secondary malignant neoplasm of bone: Secondary | ICD-10-CM

## 2016-07-02 DIAGNOSIS — C3492 Malignant neoplasm of unspecified part of left bronchus or lung: Secondary | ICD-10-CM

## 2016-07-02 DIAGNOSIS — D6481 Anemia due to antineoplastic chemotherapy: Secondary | ICD-10-CM

## 2016-07-02 DIAGNOSIS — C3490 Malignant neoplasm of unspecified part of unspecified bronchus or lung: Secondary | ICD-10-CM

## 2016-07-02 DIAGNOSIS — R634 Abnormal weight loss: Secondary | ICD-10-CM

## 2016-07-02 DIAGNOSIS — D630 Anemia in neoplastic disease: Secondary | ICD-10-CM

## 2016-07-02 MED ORDER — OXYCODONE-ACETAMINOPHEN 5-325 MG PO TABS: 1.0000 | ORAL_TABLET | ORAL | 0 refills | Status: DC | PRN

## 2016-07-02 NOTE — Patient Instructions (Signed)
   Your CT Scans are scheduled for Monday, July 06, 2016 at 10:30am. Please arrive at 10:15am to register for the scans.  Nothing to eat or drink 4 hours prior to the scans except the contrast. Drink 1 bottle of contrast at 8:30am and the second bottle at 9:30am.

## 2016-07-02 NOTE — Progress Notes (Addendum)
[facility] OFFICE PROGRESS NOTE   Diagnosis:  Small cell lung cancer  INTERVAL HISTORY:   Ms. Cotrell returns prior to scheduled follow-up for evaluation of a headache. She reports a "nagging" headache associated with anterior scalp masses. She describes the pain as a "pressure". This has been occurring for the past few days. The pain is causing difficulty sleeping. She takes Vicodin which "sometimes helps". She had a single episode of mild nausea this morning. Otherwise no nausea. No vision change. No falls or balance problems. She reports a good appetite.  Objective:  Vital signs in last 24 hours:  Blood pressure 93/63, pulse (!) 135, temperature 98.3 F (36.8 C), temperature source Oral, resp. rate 18, height 5' (1.524 m), weight 102 lb (46.3 kg), SpO2 95 %.    HEENT: PERRLA. Extraocular movements intact. Sclera anicteric. Oropharynx is without thrush or ulceration. Resp: Lungs clear bilaterally. Cardio: Regular, tachycardic. GI: Abdomen soft and nontender. No hepatomegaly. Vascular: No leg edema. Neuro: Alert and oriented. Motor strength 5 over 5. Knee DTRs 2+, symmetric. Finger to nose intact. Clumsy with rapid alternating movement involving the left hand.  Musculoskeletal: Approximate 5 cm mass right mid posterior chest wall, question increased prominence. Skin: Multiple scalp masses. Anterior scalp masses may be more prominent.    Lab Results:  Lab Results  Component Value Date   WBC 12.0 (H) 06/22/2016   HGB 9.7 (L) 06/22/2016   HCT 30.5 (L) 06/22/2016   MCV 81.9 06/22/2016   PLT 147 06/22/2016   NEUTROABS 10.2 (H) 06/22/2016    Imaging:  No results found.  Medications: I have reviewed the patient's current medications.  Assessment/Plan: 1. Limited stage small cell lung cancer, diagnosed on biopsy of a subcarinal lymph node and left lung mass by [facility] 09/12/2015 ? PET scan 09/03/2015 confirmed a hypermetabolic left perihilar mass, 2 left lower  lobe pulmonary masses, and mediastinal lymphadenopathy, no evidence of distant metastatic disease ? Brain MRI negative for metastatic disease 09/13/2015 ? Status post chest radiation 09/26/2015 through 10/18/2015  ? Cycle 1 cisplatin/etoposide beginning 09/26/2015 ? Cycle 2 cisplatin/etoposide beginning 10/16/2015 ? Cycle 3 carboplatin/etoposide beginning 11/06/2015 ? 12/17/2015 MRI of the brain negative ? 12/17/2015 CT scans chest/abdomen/pelvis (report currently not available) ? Bone scan 12/27/2015 with minimal increased uptake in the posterior aspect of the right 11th rib that does not appear abnormal on the CT images of August 2017; sclerotic focus noted on the previous CT scan in the body of L1 showed no abnormal uptake on the current bone scan; asymmetric increased uptake in the left SI joint region possibly corresponding to a subtle sclerotic focus in the medial posterior aspect of the left iliac bone ? CT chest 04/07/2016-new liver metastases, new upper abdominal adenopathy, left iliac metastasis, new right posterior lateral Pleural/chest wall metastasis ? Cycle 1 salvage chemotherapy with etoposide/carboplatin01/12/2016 ? Cycle 2 salvage chemotherapy with etoposide/carboplatin 06/09/2016-Neulasta added  2. 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 Weight loss secondary to #2  4. History of febrile neutropenia following chemotherapy  5. Remote history of a right leg DVT-2008?-Maintained on Lovenox  6. History of tobacco use  7. Admission to [facility] 10/27/2015 with neutropenic fever, esophagitis, and dehydration  8. Hypercalcemia 05/01/2016-status post intravenous fluids and Zometa12/29/2017  9. Fever prior to hospital admission 05/17/2016-infectious versus tumor fever; treated for pneumonia, respiratory panel positive for coronavirus  10. 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 Altered mental status-potentially related to infection/delirium, concern for brain metastases; brain CT 05/19/2016-no brain metastases, multiple skull metastases; improved 05/28/2016, 06/22/2016.  12. History of Leukopenia/thrombocytopenia secondary to chemotherapy-Neulasta will be added with cycle 2 etoposide/carboplatin    Disposition: Ms. Kuba appears stable. She has completed 2 cycles of carboplatin/etoposide. She presents today with a several day history of pain/tenderness associated with several of the scalp masses. The anterior scalp masses appear somewhat more prominent. She is scheduled for restaging CT scans of the head, chest, abdomen and pelvis 07/06/2016. She was given a prescription for Percocet 1-2 tablets every 4 hours as needed. She will return as scheduled on 07/07/2016 to review the CT results. She will contact the office in the interim with any problems.  Patient seen with Dr. Benay Spice.    Lida, Berkery ANP/GNP-BC   07/02/2016  3:51 PM  This was a shared visit with Ned Card. Ms. Strieter was interviewed and examined. She will try Percocet for the scalp pain. She will undergo restaging CTs neck week.  Julieanne Manson, M.D.

## 2016-07-02 NOTE — Telephone Encounter (Signed)
Reviewed with Dr. Benay Spice: Work pt in with Lattie Haw, [place]. Pt contacted to come in this afternoon to be evaluated.

## 2016-07-02 NOTE — Telephone Encounter (Signed)
"  I am not doing good.  I slept two hours last night.  I am for scans on Monday.  Chemotherapy on Tuesday.  For the last two days, I have a nagging hurtful headache to the forehead and top of my head where the bumps are.  Feels tight like I'm leaning on a bad bruise.  Dr. Benay Spice has seen the lumps on the top of my head from cancer.  The pain pills don't help.  Rate pain as a seven on pain scale.  After I take one hydrocodone it may go down to five if it works at all.  I try not to take the pain pills.  Yesterday I may have taken two to three pills throughout the day."    No scans authorized and scheduled yet.  Next  scheduled f/u 07-07-2016.

## 2016-07-03 ENCOUNTER — Telehealth: Payer: Self-pay | Admitting: *Deleted

## 2016-07-03 ENCOUNTER — Telehealth: Payer: Self-pay | Admitting: Medical Oncology

## 2016-07-03 NOTE — Telephone Encounter (Signed)
Returned call and reviewed instructions for drinking contrast and times.

## 2016-07-03 NOTE — Telephone Encounter (Signed)
"  I am to pick up contrast today.  Is this at the front desk?  I also am scheduled for lab Monday.  What time.?"   Contrast is with schedulers.  May go here to pick up contrast.  Lab can be drawn after scans.  Will notify schedulers.  Instructed to come here after scans for lab.  No further questions.

## 2016-07-06 ENCOUNTER — Ambulatory Visit (HOSPITAL_COMMUNITY)
Admission: RE | Admit: 2016-07-06 | Discharge: 2016-07-06 | Disposition: A | Discharge status: Home / Self Care | Payer: BLUE CROSS/BLUE SHIELD | Source: Ambulatory Visit | Attending: Nurse Practitioner | Admitting: Nurse Practitioner

## 2016-07-06 ENCOUNTER — Encounter: Payer: Self-pay | Admitting: *Deleted

## 2016-07-06 ENCOUNTER — Encounter (HOSPITAL_COMMUNITY): Payer: Self-pay

## 2016-07-06 ENCOUNTER — Other Ambulatory Visit (HOSPITAL_BASED_OUTPATIENT_CLINIC_OR_DEPARTMENT_OTHER): Payer: BLUE CROSS/BLUE SHIELD

## 2016-07-06 ENCOUNTER — Other Ambulatory Visit: Payer: Self-pay | Admitting: *Deleted

## 2016-07-06 DIAGNOSIS — I700 Atherosclerosis of aorta: Secondary | ICD-10-CM | POA: Diagnosis not present

## 2016-07-06 DIAGNOSIS — C3490 Malignant neoplasm of unspecified part of unspecified bronchus or lung: Secondary | ICD-10-CM | POA: Diagnosis present

## 2016-07-06 DIAGNOSIS — R918 Other nonspecific abnormal finding of lung field: Secondary | ICD-10-CM | POA: Insufficient documentation

## 2016-07-06 DIAGNOSIS — C7951 Secondary malignant neoplasm of bone: Secondary | ICD-10-CM | POA: Diagnosis not present

## 2016-07-06 DIAGNOSIS — R591 Generalized enlarged lymph nodes: Secondary | ICD-10-CM | POA: Insufficient documentation

## 2016-07-06 LAB — CBC WITH DIFFERENTIAL/PLATELET
BASO%: 0.2 % (ref 0.0–2.0)
BASOS ABS: 0 10*3/uL (ref 0.0–0.1)
EOS%: 0.3 % (ref 0.0–7.0)
Eosinophils Absolute: 0 10*3/uL (ref 0.0–0.5)
HEMATOCRIT: 28.2 % — AB (ref 34.8–46.6)
HGB: 9.1 g/dL — ABNORMAL LOW (ref 11.6–15.9)
LYMPH%: 3.9 % — ABNORMAL LOW (ref 14.0–49.7)
MCH: 27.2 pg (ref 25.1–34.0)
MCHC: 32.4 g/dL (ref 31.5–36.0)
MCV: 83.9 fL (ref 79.5–101.0)
MONO#: 1.2 10*3/uL — AB (ref 0.1–0.9)
MONO%: 9.9 % (ref 0.0–14.0)
NEUT#: 10.2 10*3/uL — ABNORMAL HIGH (ref 1.5–6.5)
NEUT%: 85.7 % — AB (ref 38.4–76.8)
PLATELETS: 299 10*3/uL (ref 145–400)
RBC: 3.36 10*6/uL — ABNORMAL LOW (ref 3.70–5.45)
RDW: 25.3 % — ABNORMAL HIGH (ref 11.2–14.5)
WBC: 11.9 10*3/uL — ABNORMAL HIGH (ref 3.9–10.3)
lymph#: 0.5 10*3/uL — ABNORMAL LOW (ref 0.9–3.3)

## 2016-07-06 LAB — COMPREHENSIVE METABOLIC PANEL
ALT: 11 U/L (ref 0–55)
ANION GAP: 11 meq/L (ref 3–11)
AST: 12 U/L (ref 5–34)
Albumin: 3.1 g/dL — ABNORMAL LOW (ref 3.5–5.0)
Alkaline Phosphatase: 156 U/L — ABNORMAL HIGH (ref 40–150)
BUN: 15.6 mg/dL (ref 7.0–26.0)
CALCIUM: 9.8 mg/dL (ref 8.4–10.4)
CHLORIDE: 101 meq/L (ref 98–109)
CO2: 23 mEq/L (ref 22–29)
CREATININE: 0.7 mg/dL (ref 0.6–1.1)
EGFR: 89 mL/min/{1.73_m2} — AB (ref >90)
Glucose: 127 mg/dl (ref 70–140)
POTASSIUM: 3.9 meq/L (ref 3.5–5.1)
Sodium: 136 mEq/L (ref 136–145)
Total Bilirubin: 0.27 mg/dL (ref 0.20–1.20)
Total Protein: 7.3 g/dL (ref 6.4–8.3)

## 2016-07-06 MED ORDER — OXYCODONE HCL 5 MG PO TABS: ORAL_TABLET | ORAL | 0 refills | Status: DC

## 2016-07-06 MED ORDER — IOPAMIDOL (ISOVUE-300) INJECTION 61%
INTRAVENOUS | Status: AC
  Filled 2016-07-06: qty 100

## 2016-07-06 MED ORDER — IOPAMIDOL (ISOVUE-300) INJECTION 61%
100.0000 mL | Freq: Once | INTRAVENOUS | Status: AC | PRN
  Administered 2016-07-06: 100 mL via INTRAVENOUS

## 2016-07-06 NOTE — Progress Notes (Signed)
Patient here for labs and CT and requesting refill for Percocet.  Refill done for Oxycodone per L. Grecco NP and given to patient and patient's husband in lobby.  Pt instructed that new script does not include tylenol and that she can take up to 3000 mg daily along with Oxycodone.  Teach back done. Pt verbalizes an understanding to return tomorrow morning at 0800 for appt with Dr. Benay Spice.

## 2016-07-07 ENCOUNTER — Telehealth: Payer: Self-pay | Admitting: *Deleted

## 2016-07-07 ENCOUNTER — Ambulatory Visit (HOSPITAL_BASED_OUTPATIENT_CLINIC_OR_DEPARTMENT_OTHER): Payer: BLUE CROSS/BLUE SHIELD | Admitting: Oncology

## 2016-07-07 ENCOUNTER — Ambulatory Visit: Payer: BLUE CROSS/BLUE SHIELD

## 2016-07-07 VITALS — BP 124/74 | HR 125 | Temp 98.2°F | Resp 18 | Ht 60.0 in | Wt 103.3 lb

## 2016-07-07 DIAGNOSIS — C3490 Malignant neoplasm of unspecified part of unspecified bronchus or lung: Secondary | ICD-10-CM

## 2016-07-07 DIAGNOSIS — C3432 Malignant neoplasm of lower lobe, left bronchus or lung: Secondary | ICD-10-CM | POA: Diagnosis not present

## 2016-07-07 DIAGNOSIS — D6481 Anemia due to antineoplastic chemotherapy: Secondary | ICD-10-CM

## 2016-07-07 DIAGNOSIS — D630 Anemia in neoplastic disease: Secondary | ICD-10-CM

## 2016-07-07 DIAGNOSIS — C787 Secondary malignant neoplasm of liver and intrahepatic bile duct: Secondary | ICD-10-CM

## 2016-07-07 DIAGNOSIS — C7951 Secondary malignant neoplasm of bone: Secondary | ICD-10-CM

## 2016-07-07 DIAGNOSIS — C7989 Secondary malignant neoplasm of other specified sites: Secondary | ICD-10-CM | POA: Diagnosis not present

## 2016-07-07 MED ORDER — PALONOSETRON HCL INJECTION 0.25 MG/5ML
INTRAVENOUS | Status: AC
  Filled 2016-07-07: qty 5

## 2016-07-07 MED ORDER — ALPRAZOLAM 0.25 MG PO TABS: 0.2500 mg | ORAL_TABLET | Freq: Every day | ORAL | 0 refills | Status: DC | PRN

## 2016-07-07 MED ORDER — DEXAMETHASONE SODIUM PHOSPHATE 10 MG/ML IJ SOLN
INTRAMUSCULAR | Status: AC
  Filled 2016-07-07: qty 1

## 2016-07-07 NOTE — Progress Notes (Signed)
[facility] OFFICE PROGRESS NOTE   Diagnosis: Small cell lung cancer  INTERVAL HISTORY:   Jenny [name] returns as scheduled. She continues to have pain at the scalp. She also has pain at the right back mass. She underwent restaging CTs yesterday . The CT of the head revealed extensive metastatic disease to the calvarium. Multiple areas of intracranial and a cranial tumor extension have progressed. No evidence of brain metastases. CTs of the chest, abdomen, and pelvis revealed stable lung nodules and progressive disease involving the liver, bones, and a peritoneal nodule. The right chest wall mass has increased in size.  Objective:  Vital signs in last 24 hours:  Blood pressure 124/74, pulse (!) 125, temperature 98.2 F (36.8 C), temperature source Oral, resp. rate 18, height 5' (1.524 m), weight 103 lb 4.8 oz (46.9 kg), SpO2 99 %. Manual pulse 125    HEENT: No thrush or ulcers, multiple masses over the scalp  Resp: Lungs clear bilaterally Cardio: Regular rate and rhythm, tachycardia GI: No hepatosplenomegaly, nontender Vascular: No leg edema Musculoskeletal: Right posterior chest wall mass     Portacath/PICC-without erythema  Lab Results:  Lab Results  Component Value Date   WBC 11.9 (H) 07/06/2016   HGB 9.1 (L) 07/06/2016   HCT 28.2 (L) 07/06/2016   MCV 83.9 07/06/2016   PLT 299 07/06/2016   NEUTROABS 10.2 (H) 07/06/2016      Imaging:  Ct Head W Wo Contrast  Result Date: 07/06/2016 CLINICAL DATA:  Small cell carcinoma lung.  Restaging. EXAM: CT HEAD WITHOUT AND WITH CONTRAST TECHNIQUE: Contiguous axial images were obtained from the base of the skull through the vertex without and with intravenous contrast CONTRAST:  144m ISOVUE-300 IOPAMIDOL (ISOVUE-300) INJECTION 61% COMPARISON:  CT head 05/19/2016 FINDINGS: Brain: Ventricle size normal. Negative for acute infarct. No acute intracranial hemorrhage. Multiple metastatic deposits in the calvarium with  intracranial and extracranial extension. Hyperdense, enhancing tumor is present in the epidural space anteriorly and in the frontal parietal region bilaterally. The volume of intracranial tumor has progressed in the interval. No shift of the midline structures. No enhancing parenchymal metastatic deposits. Vascular: No hyperdense vessel. Normal venous enhancement without evidence of dural venous thrombosis. Skull: Multiple permeative skull lesions are present bilaterally. These are associated with intracranial extradural tumor which has progressed. There is also extension into the scalp bilaterally which has progressed in the interval. Sinuses/Orbits: Mucosal edema of the right sphenoid sinus. Otherwise negative Other: None IMPRESSION: Extensive metastatic disease to the calvarium. Multiple areas of intracranial and extracranial tumor extension have progressed in the interval. No evidence of parenchymal metastatic disease. Electronically Signed   By: CFranchot GalloM.D.   On: 07/06/2016 08:34   Ct Chest W Contrast  Addendum Date: 07/07/2016   ADDENDUM REPORT: 07/07/2016 13:04 ADDENDUM: Since the time of the original dictation, the presence of a more recent prior outside CT examination has come to light. Specifically, mislabeled on the PACS system under the label of MR Brain with and without 04/07/2016 there is a CT of the chest, abdomen and pelvis also dated 04/07/2016. This addendum serves to compare some lesions on the most recent study with this prior examination. Lung lesions: Largest pulmonary nodule in the inferior segment of the lingula which currently measures 15 x 9 mm (image 101 of series 11) is unchanged. Multiple other pulmonary nodules in the lungs bilaterally also appears similar to the prior exam 04/07/2016. Chest wall lesion: Right posterior chest wall mass encircling the posterior aspect  of the right eleventh rib (axial image 56 of series 3) measures 5.5 x 3.5 cm, increased in size compared to  prior exam 04/07/2016 at which point this measured 4.5 x 2.6 cm. Liver lesions: The multiple hypovascular liver lesions appear increased in size but are stable in number compared to the prior examination from 04/07/2016. The largest lesion centered predominantly in segment 2 of the liver currently measures 4.5 x 4.4 cm (axial image 67 of series 3) and previously measured 3.3 x 3.5 cm. Other: Upon further review, in the left upper quadrant of the abdomen there is an enlarging 2.4 x 2.3 cm soft tissue attenuation lesion immediately anterior to the spleen (image 67 of series 3) which appeared contiguous with an adjacent lesion in the left lobe of the liver (lesion on image 62 of series 3), however, upon further review on the prior study this lesion was closer to the hilum of the spleen and is therefore presumably an intraperitoneal metastasis. This lesion is larger than the prior examination, measuring only 1.8 x 1.5 cm on the prior study from 04/07/2016. Musculoskeletal: Significant increase in number and size of numerous mixed lytic and sclerotic lesions throughout the axial and appendicular skeleton, indicative of progression of disease. Overall, although the extent of metastatic disease in the lungs appears similar to the prior study, the chest wall lesion (and other skeletal lesions), multiple liver lesions and the intraperitoneal lesion in the left upper quadrant have all increased in size compared to the prior examination indicative of progression. Electronically Signed   By: Vinnie Langton M.D.   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 Weight loss which has stabilized. Followup study. EXAM: CT CHEST, ABDOMEN, AND PELVIS WITH CONTRAST TECHNIQUE: Multidetector CT imaging of the chest, abdomen and pelvis was performed following the standard protocol during bolus  administration of intravenous contrast. CONTRAST:  124m ISOVUE-300 IOPAMIDOL (ISOVUE-300) INJECTION 61% COMPARISON:  PET-CT 09/03/2015.  Chest CT 08/26/2015. FINDINGS: CT CHEST FINDINGS Cardiovascular: Heart size is normal. There is no significant pericardial fluid, thickening or pericardial calcification. Aortic atherosclerosis. No definite coronary artery calcifications are identified on today's examination. Mediastinum/Nodes: Middle mediastinal lymphadenopathy adjacent to the left side of the esophagus well below the carina (axial image 33 of series 3) measuring 14 mm in short axis. Prominent amorphous nodal tissue in the left hilar region. The borderline enlarged superior mediastinal lymph node between the proximal left common carotid and left subclavian arteries measuring 8 mm in short axis. Lungs/Pleura: Extensive mass-like architectural distortion throughout the central aspect of the left lung, compatible with evolving postradiation mass-like fibrosis. Multiple new pulmonary nodules are noted throughout the left lung, the largest of which is noted along the inferior aspect of the left major fissure measuring 15 x 9 mm (axial image 101 of series 11). Several right-sided pulmonary nodules are also noted, largest of which is in the anterior aspect of the right lower lobe measuring 8 x 4 mm (mean diameter of 6 mm) on image 124 of series 11. Trace left partially loculated pleural effusion. No right pleural effusion. Musculoskeletal: Large mass in the right chest wall encircling the posterior aspect of the right eleventh rib (axial image 56 of series 3) with a large surrounding soft tissue component measuring 5.5 x 3.5 cm and subtle lytic changes in the posterior aspect of the right eleventh rib. There also lytic changes in the posterior  aspect of the right third rib. Multiple lytic and sclerotic lesions noted throughout the lower thoracic vertebral bodies, largest of which is in the T11 vertebral body inferiorly  measuring 2.6 x 4.0 cm (axial image 132 of series 11). CT ABDOMEN PELVIS FINDINGS Hepatobiliary: There are numerous new hypovascular hepatic lesions scattered throughout all aspects of the liver, the largest of which is centered predominantly in segment 2 (image 67 of series 3) measuring 4.5 x 4.4 cm. No intra or extrahepatic biliary ductal dilatation. Gallbladder is normal in appearance. Pancreas: No pancreatic mass. No pancreatic ductal dilatation. No pancreatic or peripancreatic fluid or inflammatory changes. Spleen: Unremarkable. Adrenals/Urinary Tract: Bilateral adrenal glands and bilateral kidneys are normal in appearance. There is no hydroureteronephrosis. Urinary bladder is normal in appearance. Stomach/Bowel: Normal appearance of the stomach. No pathologic dilatation of small bowel or colon. Several colonic diverticulae are noted, without surrounding inflammatory changes to suggest an acute diverticulitis at this time. Normal appendix. Vascular/Lymphatic: Aortic atherosclerosis, without evidence of aneurysm or dissection in the abdominal or pelvic vasculature. Retroaortic left renal vein (normal anatomical variant) incidentally noted. 1.4 cm short axis portacaval lymph node. 11 mm short axis hepatoduodenal ligament lymph node. Reproductive: Uterus and ovaries are atrophic. Other: No significant volume of ascites.  No pneumoperitoneum. Musculoskeletal: Widespread mixed lytic and blastic lesions are noted throughout all aspects of the visualized axial and appendicular skeleton, compatible with severe widespread metastatic disease to the bones, most impressive at L1 where there is diffuse involvement of the entire L1 vertebral body. IMPRESSION: 1. Evolving postradiation changes in the central aspect of the left lung with evidence of widespread metastatic disease, including multiple new pulmonary nodules scattered throughout the lungs bilaterally, mediastinal lymphadenopathy, diffuse skeletal involvement, and  multifocal new hepatic metastasis and upper abdominal lymphadenopathy, as detailed above. 2. Aortic atherosclerosis. 3. Additional incidental findings, as above. Electronically Signed: By: Vinnie Langton M.D. On: 07/06/2016 08:52   Ct Abdomen Pelvis W Contrast  Addendum Date: 07/07/2016   ADDENDUM REPORT: 07/07/2016 13:04 ADDENDUM: Since the time of the original dictation, the presence of a more recent prior outside CT examination has come to light. Specifically, mislabeled on the PACS system under the label of MR Brain with and without 04/07/2016 there is a CT of the chest, abdomen and pelvis also dated 04/07/2016. This addendum serves to compare some lesions on the most recent study with this prior examination. Lung lesions: Largest pulmonary nodule in the inferior segment of the lingula which currently measures 15 x 9 mm (image 101 of series 11) is unchanged. Multiple other pulmonary nodules in the lungs bilaterally also appears similar to the prior exam 04/07/2016. Chest wall lesion: Right posterior chest wall mass encircling the posterior aspect of the right eleventh rib (axial image 56 of series 3) measures 5.5 x 3.5 cm, increased in size compared to prior exam 04/07/2016 at which point this measured 4.5 x 2.6 cm. Liver lesions: The multiple hypovascular liver lesions appear increased in size but are stable in number compared to the prior examination from 04/07/2016. The largest lesion centered predominantly in segment 2 of the liver currently measures 4.5 x 4.4 cm (axial image 67 of series 3) and previously measured 3.3 x 3.5 cm. Other: Upon further review, in the left upper quadrant of the abdomen there is an enlarging 2.4 x 2.3 cm soft tissue attenuation lesion immediately anterior to the spleen (image 67 of series 3) which appeared contiguous with an adjacent lesion in the left lobe of the liver (lesion  on image 62 of series 3), however, upon further review on the prior study this lesion was closer to  the hilum of the spleen and is therefore presumably an intraperitoneal metastasis. This lesion is larger than the prior examination, measuring only 1.8 x 1.5 cm on the prior study from 04/07/2016. Musculoskeletal: Significant increase in number and size of numerous mixed lytic and sclerotic lesions throughout the axial and appendicular skeleton, indicative of progression of disease. Overall, although the extent of metastatic disease in the lungs appears similar to the prior study, the chest wall lesion (and other skeletal lesions), multiple liver lesions and the intraperitoneal lesion in the left upper quadrant have all increased in size compared to the prior examination indicative of progression. Electronically Signed   By: Vinnie Langton M.D.   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 Weight loss which has stabilized. Followup study. EXAM: CT CHEST, ABDOMEN, AND PELVIS WITH CONTRAST TECHNIQUE: Multidetector CT imaging of the chest, abdomen and pelvis was performed following the standard protocol during bolus administration of intravenous contrast. CONTRAST:  166m ISOVUE-300 IOPAMIDOL (ISOVUE-300) INJECTION 61% COMPARISON:  PET-CT 09/03/2015.  Chest CT 08/26/2015. FINDINGS: CT CHEST FINDINGS Cardiovascular: Heart size is normal. There is no significant pericardial fluid, thickening or pericardial calcification. Aortic atherosclerosis. No definite coronary artery calcifications are identified on today's examination. Mediastinum/Nodes: Middle mediastinal lymphadenopathy adjacent to the left side of the esophagus well below the carina (axial image 33 of series 3) measuring 14 mm in short axis. Prominent amorphous nodal tissue in the left hilar region. The borderline enlarged superior mediastinal lymph node between the proximal left common carotid and left subclavian  arteries measuring 8 mm in short axis. Lungs/Pleura: Extensive mass-like architectural distortion throughout the central aspect of the left lung, compatible with evolving postradiation mass-like fibrosis. Multiple new pulmonary nodules are noted throughout the left lung, the largest of which is noted along the inferior aspect of the left major fissure measuring 15 x 9 mm (axial image 101 of series 11). Several right-sided pulmonary nodules are also noted, largest of which is in the anterior aspect of the right lower lobe measuring 8 x 4 mm (mean diameter of 6 mm) on image 124 of series 11. Trace left partially loculated pleural effusion. No right pleural effusion. Musculoskeletal: Large mass in the right chest wall encircling the posterior aspect of the right eleventh rib (axial image 56 of series 3) with a large surrounding soft tissue component measuring 5.5 x 3.5 cm and subtle lytic changes in the posterior aspect of the right eleventh rib. There also lytic changes in the posterior aspect of the right third rib. Multiple lytic and sclerotic lesions noted throughout the lower thoracic vertebral bodies, largest of which is in the T11 vertebral body inferiorly measuring 2.6 x 4.0 cm (axial image 132 of series 11). CT ABDOMEN PELVIS FINDINGS Hepatobiliary: There are numerous new hypovascular hepatic lesions scattered throughout all aspects of the liver, the largest of which is centered predominantly in segment 2 (image 67 of series 3) measuring 4.5 x 4.4 cm. No intra or extrahepatic biliary ductal dilatation. Gallbladder is normal in appearance. Pancreas: No pancreatic mass. No pancreatic ductal dilatation. No pancreatic or peripancreatic fluid or inflammatory changes. Spleen: Unremarkable. Adrenals/Urinary Tract: Bilateral adrenal glands and bilateral kidneys are normal in appearance. There is no hydroureteronephrosis. Urinary bladder is normal in appearance. Stomach/Bowel: Normal appearance of the  stomach. No  pathologic dilatation of small bowel or colon. Several colonic diverticulae are noted, without surrounding inflammatory changes to suggest an acute diverticulitis at this time. Normal appendix. Vascular/Lymphatic: Aortic atherosclerosis, without evidence of aneurysm or dissection in the abdominal or pelvic vasculature. Retroaortic left renal vein (normal anatomical variant) incidentally noted. 1.4 cm short axis portacaval lymph node. 11 mm short axis hepatoduodenal ligament lymph node. Reproductive: Uterus and ovaries are atrophic. Other: No significant volume of ascites.  No pneumoperitoneum. Musculoskeletal: Widespread mixed lytic and blastic lesions are noted throughout all aspects of the visualized axial and appendicular skeleton, compatible with severe widespread metastatic disease to the bones, most impressive at L1 where there is diffuse involvement of the entire L1 vertebral body. IMPRESSION: 1. Evolving postradiation changes in the central aspect of the left lung with evidence of widespread metastatic disease, including multiple new pulmonary nodules scattered throughout the lungs bilaterally, mediastinal lymphadenopathy, diffuse skeletal involvement, and multifocal new hepatic metastasis and upper abdominal lymphadenopathy, as detailed above. 2. Aortic atherosclerosis. 3. Additional incidental findings, as above. Electronically Signed: By: Vinnie Langton M.D. On: 07/06/2016 08:52  CT images reviewed  Medications: I have reviewed the patient's current medications.  Assessment/Plan: 1. Limited stage small cell lung cancer, diagnosed on biopsy of a subcarinal lymph node and left lung mass by [facility] 09/12/2015 ? PET scan 09/03/2015 confirmed a hypermetabolic left perihilar mass, 2 left lower lobe pulmonary masses, and mediastinal lymphadenopathy, no evidence of distant metastatic disease ? Brain MRI negative for metastatic disease 09/13/2015 ? Status post chest radiation 09/26/2015 through  10/18/2015  ? Cycle 1 cisplatin/etoposide beginning 09/26/2015 ? Cycle 2 cisplatin/etoposide beginning 10/16/2015 ? Cycle 3 carboplatin/etoposide beginning 11/06/2015 ? 12/17/2015 MRI of the brain negative ? 12/17/2015 CT scans chest/abdomen/pelvis (report currently not available) ? Bone scan 12/27/2015 with minimal increased uptake in the posterior aspect of the right 11th rib that does not appear abnormal on the CT images of August 2017; sclerotic focus noted on the previous CT scan in the body of L1 showed no abnormal uptake on the current bone scan; asymmetric increased uptake in the left SI joint region possibly corresponding to a subtle sclerotic focus in the medial posterior aspect of the left iliac bone ? CT chest 04/07/2016-new liver metastases, new upper abdominal adenopathy, left iliac metastasis, new right posterior lateral Pleural/chest wall metastasis ? Cycle 1 salvage chemotherapy with etoposide/carboplatin01/12/2016 ? Cycle 2 salvage chemotherapy with etoposide/carboplatin 06/09/2016-Neulasta added ? Restaging CTs of the head, chest, abdomen, and pelvis on 07/06/2016-progressive skull metastases with intracranial/extracranial extension, stable lung nodules, progressive disease in the liver, bones, enlargement of a right chest wall mass, and enlarging peritoneal nodule  2. 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 Weight loss secondary to #2  4. History of febrile neutropenia following chemotherapy  5. Remote history of a right leg DVT-2008?-Maintained on Lovenox  6. History of tobacco use  7. Admission to [facility]/27/2015 with neutropenic fever, esophagitis, and dehydration  8. Hypercalcemia 05/01/2016-status post intravenous fluids and Zometa12/29/2017  9. Fever prior to hospital admission 05/17/2016-infectious versus tumor fever; treated for pneumonia, respiratory panel positive for  coronavirus  10. 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 Altered mental status-potentially related to infection/delirium, concern for brain metastases; brain CT 05/19/2016-no brain metastases, multiple skull metastases; improved 05/28/2016, 06/22/2016.  12. History of Leukopenia/thrombocytopenia secondary to chemotherapy-Neulasta will be added with cycle 2 etoposide/carboplatin     Disposition:  Jenny [name] has clinical and CT evidence of disease progression after 2 cycles of salvage etoposide/carboplatin chemotherapy. I reviewed the restaging results with Jenny [name] and her family. He understands no therapy will be curative. We discussed comfort care versus a trial of salvage chemotherapy. We also discussed the possibility of receiving immunotherapy if she progresses in the future.  Jenny [name] would like to proceed with salvage chemotherapy. I recommend irinotecan/cisplatin. We reviewed the potential toxicities associated with this regimen including the chance for nausea/vomiting, mucositis, diarrhea, and hematologic toxicity. We discussed the neuropathy and renal toxicity associated with cisplatin. She agrees to proceed. The plan is to treat her with irinotecan/cisplatin on a 2 week schedule.  She has persistent tachycardia. This is likely multifactorial including a component related to anxiety, deconditioning, anemia, and the chest tumor burden. I have a low suspicion for a primary arrhythmia or pulmonary embolism.  30 minutes were spent with the patient today. The majority of the time was used for counseling and coordination of care. A chemotherapy plan was entered.  Betsy Coder, MD  07/07/2016  2:16 PM

## 2016-07-07 NOTE — Telephone Encounter (Signed)
Tearful call received.  "I saw Dr. Benay Spice this morning.  While I was there I felt fine.  Told him I am not having any pain.  I started hurting to my left thigh and back at my right back rib area after I left the office.  My husband and I talked due to the high level of pain and thought I should call.  I took two Oxy IR at 9:00 am and now the pain is a seven on the pain scale.  Lattie Haw told me I can take tylenol so I will.  I'll call back if this does not help."  Called Dr. Benay Spice with this information.  No new orders at this time.  Oxy IR was filled yesterday.

## 2016-07-07 NOTE — Progress Notes (Signed)
DISCONTINUE OFF PATHWAY REGIMEN - Small Cell Lung     A cycle is every 21 days:     Etoposide        Dose Mod: None     Carboplatin        Dose Mod: None  **Always confirm dose/schedule in your pharmacy ordering system**    REASON: Disease Progression PRIOR TREATMENT: [id number]: Etoposide 100 mg/m2 Days 1, 2, 3 + Carboplatin AUC=5 Day 1 q21 Days x 4 Cycles TREATMENT RESPONSE: Progressive Disease (PD)  START OFF PATHWAY REGIMEN - Small Cell Lung   [id number]:Cisplatin + Irinotecan:   A cycle is every 21 days:     Irinotecan        Dose Mod: None     Cisplatin        Dose Mod: None  **Always confirm dose/schedule in your pharmacy ordering system**    Patient Characteristics: Extensive and Limited Stage, Second Line, Relapse < 3 Months Stage Grouping: Extensive AJCC T Category: TX AJCC N Category: NX AJCC M Category: M1c AJCC 8 Stage Grouping: IVB Line of therapy: Second Line Would you be surprised if this patient died  in the next year? I would NOT be surprised if this patient died in the next year Time to Relapse: Relapse < 3 Months  Intent of Therapy: Non-Curative / Palliative Intent, Discussed with Patient

## 2016-07-08 ENCOUNTER — Telehealth: Payer: Self-pay | Admitting: *Deleted

## 2016-07-08 ENCOUNTER — Ambulatory Visit: Payer: Self-pay

## 2016-07-08 MED ORDER — OXYCODONE HCL 5 MG PO TABS: ORAL_TABLET | ORAL | 0 refills | Status: DC

## 2016-07-08 NOTE — Telephone Encounter (Signed)
Message received from patient stating that she needed a "stronger pain medication" d/t her increased pain to back and legs.  Call placed back to patient and patient notified per order of Dr. Benay Spice to increase dose of Oxycodone 5 mg to three tablets every 4 hrs as needed and to call back if the increased dose does not help pain. Teach back done. Patient appreciative of call back and has no further questions or concerns at this time.

## 2016-07-09 ENCOUNTER — Telehealth: Payer: Self-pay | Admitting: *Deleted

## 2016-07-09 ENCOUNTER — Ambulatory Visit: Payer: Self-pay

## 2016-07-09 MED ORDER — OXYCODONE HCL 5 MG PO TABS: ORAL_TABLET | ORAL | 0 refills | Status: DC

## 2016-07-09 NOTE — Telephone Encounter (Signed)
Spoke with patient regarding pain regime, patient unclear on how to take and manage pain with pain medication. Patient education preformed explaining that if after 4 hours she is not experiencing any pain, only take pain medication if she is experiencing pain. Encourage patient to keep a log of pain, and when she takes her pain medication. Patient verbalized understanding. Also explained to patient of upcoming appointments on March 12 th, and Lattie Haw, NP will see patient to further assess her pain management.

## 2016-07-09 NOTE — Telephone Encounter (Signed)
Per desk LPN call and 3/6 LOS I have scheduled appts. Desk LPN to contact the the patient

## 2016-07-09 NOTE — Telephone Encounter (Signed)
Refilled Oxycodone per Benay Spice, MD. Prescription left for pick up and patient advised.

## 2016-07-10 ENCOUNTER — Telehealth: Payer: Self-pay

## 2016-07-10 NOTE — Telephone Encounter (Signed)
Patient pharmacy called back- pharmacist was able to speak to the on call doctor last night to confirm prescription was written again last night. Medication has already been dispensed to patient. Nothing further needed at this time.

## 2016-07-10 NOTE — Telephone Encounter (Signed)
Pt went to pharmacy to pick up oxycodone and was told they cannot fill the rx until they talk with the doctor. She uses [facility].

## 2016-07-12 ENCOUNTER — Other Ambulatory Visit: Payer: Self-pay | Admitting: Oncology

## 2016-07-13 ENCOUNTER — Other Ambulatory Visit (HOSPITAL_BASED_OUTPATIENT_CLINIC_OR_DEPARTMENT_OTHER): Payer: BLUE CROSS/BLUE SHIELD

## 2016-07-13 ENCOUNTER — Ambulatory Visit (HOSPITAL_BASED_OUTPATIENT_CLINIC_OR_DEPARTMENT_OTHER): Payer: BLUE CROSS/BLUE SHIELD | Admitting: Nurse Practitioner

## 2016-07-13 ENCOUNTER — Ambulatory Visit (HOSPITAL_BASED_OUTPATIENT_CLINIC_OR_DEPARTMENT_OTHER): Payer: BLUE CROSS/BLUE SHIELD

## 2016-07-13 VITALS — BP 103/60 | HR 86 | Temp 98.0°F | Resp 18

## 2016-07-13 DIAGNOSIS — C3492 Malignant neoplasm of unspecified part of left bronchus or lung: Secondary | ICD-10-CM

## 2016-07-13 DIAGNOSIS — Z5111 Encounter for antineoplastic chemotherapy: Secondary | ICD-10-CM

## 2016-07-13 DIAGNOSIS — G893 Neoplasm related pain (acute) (chronic): Secondary | ICD-10-CM

## 2016-07-13 DIAGNOSIS — C7951 Secondary malignant neoplasm of bone: Secondary | ICD-10-CM

## 2016-07-13 DIAGNOSIS — C3491 Malignant neoplasm of unspecified part of right bronchus or lung: Secondary | ICD-10-CM | POA: Diagnosis not present

## 2016-07-13 DIAGNOSIS — C3490 Malignant neoplasm of unspecified part of unspecified bronchus or lung: Secondary | ICD-10-CM

## 2016-07-13 LAB — CBC WITH DIFFERENTIAL/PLATELET
BASO%: 0 % (ref 0.0–2.0)
Basophils Absolute: 0 10*3/uL (ref 0.0–0.1)
EOS%: 0 % (ref 0.0–7.0)
Eosinophils Absolute: 0 10*3/uL (ref 0.0–0.5)
HCT: 28.2 % — ABNORMAL LOW (ref 34.8–46.6)
HEMOGLOBIN: 8.8 g/dL — AB (ref 11.6–15.9)
LYMPH%: 2 % — ABNORMAL LOW (ref 14.0–49.7)
MCH: 26.5 pg (ref 25.1–34.0)
MCHC: 31.2 g/dL — ABNORMAL LOW (ref 31.5–36.0)
MCV: 84.9 fL (ref 79.5–101.0)
MONO#: 2.9 10*3/uL — ABNORMAL HIGH (ref 0.1–0.9)
MONO%: 13.1 % (ref 0.0–14.0)
NEUT%: 84.9 % — ABNORMAL HIGH (ref 38.4–76.8)
NEUTROS ABS: 18.8 10*3/uL — AB (ref 1.5–6.5)
Platelets: 352 10*3/uL (ref 145–400)
RBC: 3.32 10*6/uL — ABNORMAL LOW (ref 3.70–5.45)
RDW: 20.9 % — AB (ref 11.2–14.5)
WBC: 22.2 10*3/uL — AB (ref 3.9–10.3)
lymph#: 0.5 10*3/uL — ABNORMAL LOW (ref 0.9–3.3)

## 2016-07-13 LAB — COMPREHENSIVE METABOLIC PANEL
ALBUMIN: 2.7 g/dL — AB (ref 3.5–5.0)
ALK PHOS: 156 U/L — AB (ref 40–150)
ALT: 7 U/L (ref 0–55)
AST: 32 U/L (ref 5–34)
Anion Gap: 14 mEq/L — ABNORMAL HIGH (ref 3–11)
BILIRUBIN TOTAL: 0.56 mg/dL (ref 0.20–1.20)
BUN: 15 mg/dL (ref 7.0–26.0)
CO2: 22 mEq/L (ref 22–29)
CREATININE: 0.8 mg/dL (ref 0.6–1.1)
Calcium: 9.6 mg/dL (ref 8.4–10.4)
Chloride: 97 mEq/L — ABNORMAL LOW (ref 98–109)
EGFR: 78 mL/min/{1.73_m2} — ABNORMAL LOW (ref >90)
GLUCOSE: 159 mg/dL — AB (ref 70–140)
Potassium: 4.7 mEq/L (ref 3.5–5.1)
SODIUM: 134 meq/L — AB (ref 136–145)
TOTAL PROTEIN: 7.3 g/dL (ref 6.4–8.3)

## 2016-07-13 LAB — MAGNESIUM: Magnesium: 1.8 mg/dl (ref 1.5–2.5)

## 2016-07-13 MED ORDER — OXYCODONE HCL ER 20 MG PO T12A: 20.0000 mg | EXTENDED_RELEASE_TABLET | Freq: Two times a day (BID) | ORAL | 0 refills | Status: DC

## 2016-07-13 MED ORDER — SODIUM CHLORIDE 0.9 % IV SOLN
Freq: Once | INTRAVENOUS | Status: AC
  Administered 2016-07-13: 13:00:00 via INTRAVENOUS
  Filled 2016-07-13: qty 5

## 2016-07-13 MED ORDER — PALONOSETRON HCL INJECTION 0.25 MG/5ML
INTRAVENOUS | Status: AC
  Filled 2016-07-13: qty 5

## 2016-07-13 MED ORDER — DEXTROSE 5 % IV SOLN
65.0000 mg/m2 | Freq: Once | INTRAVENOUS | Status: AC
  Administered 2016-07-13: 100 mg via INTRAVENOUS
  Filled 2016-07-13: qty 5

## 2016-07-13 MED ORDER — PROCHLORPERAZINE MALEATE 10 MG PO TABS: 10.0000 mg | ORAL_TABLET | Freq: Four times a day (QID) | ORAL | 2 refills | Status: AC | PRN

## 2016-07-13 MED ORDER — SODIUM CHLORIDE 0.9 % IV SOLN
30.0000 mg/m2 | Freq: Once | INTRAVENOUS | Status: AC
  Administered 2016-07-13: 42 mg via INTRAVENOUS
  Filled 2016-07-13: qty 42

## 2016-07-13 MED ORDER — SODIUM CHLORIDE 0.9 % IV SOLN
Freq: Once | INTRAVENOUS | Status: AC
  Administered 2016-07-13: 09:00:00 via INTRAVENOUS

## 2016-07-13 MED ORDER — OXYCODONE HCL 5 MG PO TABS: 10.0000 mg | ORAL_TABLET | ORAL | 0 refills | Status: DC | PRN

## 2016-07-13 MED ORDER — ATROPINE SULFATE 1 MG/ML IJ SOLN
INTRAMUSCULAR | Status: AC
  Filled 2016-07-13: qty 1

## 2016-07-13 MED ORDER — POTASSIUM CHLORIDE 2 MEQ/ML IV SOLN
Freq: Once | INTRAVENOUS | Status: AC
  Administered 2016-07-13: 10:00:00 via INTRAVENOUS
  Filled 2016-07-13: qty 10

## 2016-07-13 MED ORDER — PALONOSETRON HCL INJECTION 0.25 MG/5ML
0.2500 mg | Freq: Once | INTRAVENOUS | Status: AC
  Administered 2016-07-13: 0.25 mg via INTRAVENOUS

## 2016-07-13 MED ORDER — ATROPINE SULFATE 1 MG/ML IJ SOLN: 0.5000 mg | Freq: Once | INTRAMUSCULAR | Status: DC | PRN

## 2016-07-13 NOTE — Patient Instructions (Signed)
[facility] Discharge Instructions for Patients Receiving Chemotherapy  Today you received the following chemotherapy agents: Irinotecan and Cisplatin.  To help prevent nausea and vomiting after your treatment, we encourage you to take your nausea medication as directed.   If you develop nausea and vomiting that is not controlled by your nausea medication, call the clinic.   BELOW ARE SYMPTOMS THAT SHOULD BE REPORTED IMMEDIATELY:  *FEVER GREATER THAN 100.5 F  *CHILLS WITH OR WITHOUT FEVER  NAUSEA AND VOMITING THAT IS NOT CONTROLLED WITH YOUR NAUSEA MEDICATION  *UNUSUAL SHORTNESS OF BREATH  *UNUSUAL BRUISING OR BLEEDING  TENDERNESS IN MOUTH AND THROAT WITH OR WITHOUT PRESENCE OF ULCERS  *URINARY PROBLEMS  *BOWEL PROBLEMS  UNUSUAL RASH Items with * indicate a potential emergency and should be followed up as soon as possible.  Feel free to call the clinic you have any questions or concerns. The clinic phone number is [phone].  Please show the [place] at check-in to the Emergency Department and triage nurse.  Irinotecan injection What is this medicine? IRINOTECAN (ir in oh TEE kan ) is a chemotherapy drug. It is used to treat colon and rectal cancer. This medicine may be used for other purposes; ask your health care provider or pharmacist if you have questions. COMMON BRAND NAME(S): Camptosar What should I tell my health care provider before I take this medicine? They need to know if you have any of these conditions: -blood disorders -dehydration -diarrhea -infection (especially a virus infection such as chickenpox, cold sores, or herpes) -liver disease -low blood counts, like low white cell, platelet, or red cell counts -recent or ongoing radiation therapy -an unusual or allergic reaction to irinotecan, sorbitol, other chemotherapy, other medicines, foods, dyes, or preservatives -pregnant or trying to get pregnant -breast-feeding How  should I use this medicine? This drug is given as an infusion into a vein. It is administered in a hospital or clinic by a specially trained health care professional. Talk to your pediatrician regarding the use of this medicine in children. Special care may be needed. Overdosage: If you think you have taken too much of this medicine contact a poison control center or emergency room at once. NOTE: This medicine is only for you. Do not share this medicine with others. What if I miss a dose? It is important not to miss your dose. Call your doctor or health care professional if you are unable to keep an appointment. What may interact with this medicine? Do not take this medicine with any of the following medications: -atazanavir -certain medicines for fungal infections like itraconazole and ketoconazole -St. John's Wort This medicine may also interact with the following medications: -dexamethasone -diuretics -laxatives -medicines for seizures like carbamazepine, mephobarbital, phenobarbital, phenytoin, primidone -medicines to increase blood counts like filgrastim, pegfilgrastim, sargramostim -prochlorperazine -vaccines This list may not describe all possible interactions. Give your health care provider a list of all the medicines, herbs, non-prescription drugs, or dietary supplements you use. Also tell them if you smoke, drink alcohol, or use illegal drugs. Some items may interact with your medicine. What should I watch for while using this medicine? Your condition will be monitored carefully while you are receiving this medicine. You will need important blood work done while you are taking this medicine. This drug may make you feel generally unwell. This is not uncommon, as chemotherapy can affect healthy cells as well as cancer cells. Report any side effects. Continue your course of treatment even though you  feel ill unless your doctor tells you to stop. In some cases, you may be given  additional medicines to help with side effects. Follow all directions for their use. You may get drowsy or dizzy. Do not drive, use machinery, or do anything that needs mental alertness until you know how this medicine affects you. Do not stand or sit up quickly, especially if you are an older patient. This reduces the risk of dizzy or fainting spells. Call your doctor or health care professional for advice if you get a fever, chills or sore throat, or other symptoms of a cold or flu. Do not treat yourself. This drug decreases your body's ability to fight infections. Try to avoid being around people who are sick. This medicine may increase your risk to bruise or bleed. Call your doctor or health care professional if you notice any unusual bleeding. Be careful brushing and flossing your teeth or using a toothpick because you may get an infection or bleed more easily. If you have any dental work done, tell your dentist you are receiving this medicine. Avoid taking products that contain aspirin, acetaminophen, ibuprofen, naproxen, or ketoprofen unless instructed by your doctor. These medicines may hide a fever. Do not become pregnant while taking this medicine. Women should inform their doctor if they wish to become pregnant or think they might be pregnant. There is a potential for serious side effects to an unborn child. Talk to your health care professional or pharmacist for more information. Do not breast-feed an infant while taking this medicine. What side effects may I notice from receiving this medicine? Side effects that you should report to your doctor or health care professional as soon as possible: -allergic reactions like skin rash, itching or hives, swelling of the face, lips, or tongue -low blood counts - this medicine may decrease the number of white blood cells, red blood cells and platelets. You may be at increased risk for infections and bleeding. -signs of infection - fever or chills,  cough, sore throat, pain or difficulty passing urine -signs of decreased platelets or bleeding - bruising, pinpoint red spots on the skin, black, tarry stools, blood in the urine -signs of decreased red blood cells - unusually weak or tired, fainting spells, lightheadedness -breathing problems -chest pain -diarrhea -feeling faint or lightheaded, falls -flushing, runny nose, sweating during infusion -mouth sores or pain -pain, swelling, redness or irritation where injected -pain, swelling, warmth in the leg -pain, tingling, numbness in the hands or feet -problems with balance, talking, walking -stomach cramps, pain -trouble passing urine or change in the amount of urine -vomiting as to be unable to hold down drinks or food -yellowing of the eyes or skin Side effects that usually do not require medical attention (report to your doctor or health care professional if they continue or are bothersome): -constipation -hair loss -headache -loss of appetite -nausea, vomiting -stomach upset This list may not describe all possible side effects. Call your doctor for medical advice about side effects. You may report side effects to FDA at 1-800-FDA-1088. Where should I keep my medicine? This drug is given in a hospital or clinic and will not be stored at home. NOTE: This sheet is a summary. It may not cover all possible information. If you have questions about this medicine, talk to your doctor, pharmacist, or health care provider.  2018 Elsevier/Gold Standard (2012-10-17 16:29:32) Cisplatin injection What is this medicine? CISPLATIN (SIS pla tin) is a chemotherapy drug. It targets fast dividing cells,  like cancer cells, and causes these cells to die. This medicine is used to treat many types of cancer like bladder, ovarian, and testicular cancers. This medicine may be used for other purposes; ask your health care provider or pharmacist if you have questions. COMMON BRAND NAME(S): Platinol,  Platinol -AQ What should I tell my health care provider before I take this medicine? They need to know if you have any of these conditions: -blood disorders -hearing problems -kidney disease -recent or ongoing radiation therapy -an unusual or allergic reaction to cisplatin, carboplatin, other chemotherapy, other medicines, foods, dyes, or preservatives -pregnant or trying to get pregnant -breast-feeding How should I use this medicine? This drug is given as an infusion into a vein. It is administered in a hospital or clinic by a specially trained health care professional. Talk to your pediatrician regarding the use of this medicine in children. Special care may be needed. Overdosage: If you think you have taken too much of this medicine contact a poison control center or emergency room at once. NOTE: This medicine is only for you. Do not share this medicine with others. What if I miss a dose? It is important not to miss a dose. Call your doctor or health care professional if you are unable to keep an appointment. What may interact with this medicine? -dofetilide -foscarnet -medicines for seizures -medicines to increase blood counts like filgrastim, pegfilgrastim, sargramostim -probenecid -pyridoxine used with altretamine -rituximab -some antibiotics like amikacin, gentamicin, neomycin, polymyxin B, streptomycin, tobramycin -sulfinpyrazone -vaccines -zalcitabine Talk to your doctor or health care professional before taking any of these medicines: -acetaminophen -aspirin -ibuprofen -ketoprofen -naproxen This list may not describe all possible interactions. Give your health care provider a list of all the medicines, herbs, non-prescription drugs, or dietary supplements you use. Also tell them if you smoke, drink alcohol, or use illegal drugs. Some items may interact with your medicine. What should I watch for while using this medicine? Your condition will be monitored carefully while  you are receiving this medicine. You will need important blood work done while you are taking this medicine. This drug may make you feel generally unwell. This is not uncommon, as chemotherapy can affect healthy cells as well as cancer cells. Report any side effects. Continue your course of treatment even though you feel ill unless your doctor tells you to stop. In some cases, you may be given additional medicines to help with side effects. Follow all directions for their use. Call your doctor or health care professional for advice if you get a fever, chills or sore throat, or other symptoms of a cold or flu. Do not treat yourself. This drug decreases your body's ability to fight infections. Try to avoid being around people who are sick. This medicine may increase your risk to bruise or bleed. Call your doctor or health care professional if you notice any unusual bleeding. Be careful brushing and flossing your teeth or using a toothpick because you may get an infection or bleed more easily. If you have any dental work done, tell your dentist you are receiving this medicine. Avoid taking products that contain aspirin, acetaminophen, ibuprofen, naproxen, or ketoprofen unless instructed by your doctor. These medicines may hide a fever. Do not become pregnant while taking this medicine. Women should inform their doctor if they wish to become pregnant or think they might be pregnant. There is a potential for serious side effects to an unborn child. Talk to your health care professional or pharmacist for  more information. Do not breast-feed an infant while taking this medicine. Drink fluids as directed while you are taking this medicine. This will help protect your kidneys. Call your doctor or health care professional if you get diarrhea. Do not treat yourself. What side effects may I notice from receiving this medicine? Side effects that you should report to your doctor or health care professional as soon as  possible: -allergic reactions like skin rash, itching or hives, swelling of the face, lips, or tongue -signs of infection - fever or chills, cough, sore throat, pain or difficulty passing urine -signs of decreased platelets or bleeding - bruising, pinpoint red spots on the skin, black, tarry stools, nosebleeds -signs of decreased red blood cells - unusually weak or tired, fainting spells, lightheadedness -breathing problems -changes in hearing -gout pain -low blood counts - This drug may decrease the number of white blood cells, red blood cells and platelets. You may be at increased risk for infections and bleeding. -nausea and vomiting -pain, swelling, redness or irritation at the injection site -pain, tingling, numbness in the hands or feet -problems with balance, movement -trouble passing urine or change in the amount of urine Side effects that usually do not require medical attention (report to your doctor or health care professional if they continue or are bothersome): -changes in vision -loss of appetite -metallic taste in the mouth or changes in taste This list may not describe all possible side effects. Call your doctor for medical advice about side effects. You may report side effects to FDA at 1-800-FDA-1088. Where should I keep my medicine? This drug is given in a hospital or clinic and will not be stored at home. NOTE: This sheet is a summary. It may not cover all possible information. If you have questions about this medicine, talk to your doctor, pharmacist, or health care provider.  2018 Elsevier/Gold Standard (2007-07-26 14:40:54)

## 2016-07-13 NOTE — Progress Notes (Signed)
[facility] [facility] OFFICE PROGRESS NOTE   Diagnosis:  Small cell lung cancer  INTERVAL HISTORY:   Jenny [name] returns as scheduled. She reports pain involving the scalp lesions, mid to low back and bilateral hips. She is taking oxycodone 10-15 mg every 4 hours to keep the pain under control. She had some mild nausea this morning. No vomiting. Bowels are moving. She denies shortness of breath.  Objective:  Vital signs in last 24 hours:  Temperature 98.0, heart rate 94, respirations 18, blood pressure 91/49    Resp: Lungs clear bilaterally. Cardio: Regular rate and rhythm. GI: Abdomen soft and nontender. No hepatomegaly. Vascular: No leg edema. Calves soft and nontender. Neuro: Alert and oriented. Musculoskeletal: Multiple masses over the scalp.    Lab Results:  Lab Results  Component Value Date   WBC 22.2 (H) 07/13/2016   HGB 8.8 (L) 07/13/2016   HCT 28.2 (L) 07/13/2016   MCV 84.9 07/13/2016   PLT 352 07/13/2016   NEUTROABS 18.8 (H) 07/13/2016    Imaging:  No results found.  Medications: I have reviewed the patient's current medications.  Assessment/Plan: 1. Limited stage small cell lung cancer, diagnosed on biopsy of a subcarinal lymph node and left lung mass by [facility] 09/12/2015 ? PET scan 09/03/2015 confirmed a hypermetabolic left perihilar mass, 2 left lower lobe pulmonary masses, and mediastinal lymphadenopathy, no evidence of distant metastatic disease ? Brain MRI negative for metastatic disease 09/13/2015 ? Status post chest radiation 09/26/2015 through 10/18/2015  ? Cycle 1 cisplatin/etoposide beginning 09/26/2015 ? Cycle 2 cisplatin/etoposide beginning 10/16/2015 ? Cycle 3 carboplatin/etoposide beginning 11/06/2015 ? 12/17/2015 MRI of the brain negative ? 12/17/2015 CT scans chest/abdomen/pelvis (report currently not available) ? Bone scan 12/27/2015 with minimal increased uptake in the posterior aspect of the right 11th rib that does not appear  abnormal on the CT images of August 2017; sclerotic focus noted on the previous CT scan in the body of L1 showed no abnormal uptake on the current bone scan; asymmetric increased uptake in the left SI joint region possibly corresponding to a subtle sclerotic focus in the medial posterior aspect of the left iliac bone ? CT chest 04/07/2016-new liver metastases, new upper abdominal adenopathy, left iliac metastasis, new right posterior lateral Pleural/chest wall metastasis ? Cycle 1 salvage chemotherapy with etoposide/carboplatin01/12/2016 ? Cycle 2 salvage chemotherapy with etoposide/carboplatin 06/09/2016-Neulasta added ? Restaging CTs of the head, chest, abdomen, and pelvis on 07/06/2016-progressive skull metastases with intracranial/extracranial extension, stable lung nodules, progressive disease in the liver, bones, enlargement of a right chest wall mass, and enlarging peritoneal nodule ? Cycle 1 irinotecan/cisplatin 07/13/2016  2. 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 Weight loss secondary to #2  4. History of febrile neutropenia following chemotherapy  5. Remote history of a right leg DVT-2008?-Maintained on Lovenox  6. History of tobacco use  7. Admission to [facility] 10/27/2015 with neutropenic fever, esophagitis, and dehydration  8. Hypercalcemia 05/01/2016-status post intravenous fluids and Zometa12/29/2017  9. Fever prior to hospital admission 05/17/2016-infectious versus tumor fever; treated for pneumonia, respiratory panel positive for coronavirus  10. 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 Altered mental status-potentially related to infection/delirium, concern for brain metastases; brain CT 05/19/2016-no brain metastases, multiple skull metastases; improved 05/28/2016, 06/22/2016.  12. History of  Leukopenia/thrombocytopenia secondary to chemotherapy-Neulasta added with cycle 2 etoposide/carboplatin    Disposition: Jenny [name] appears unchanged. The plan is to proceed with cycle  1 irinotecan/cisplatin today as scheduled.  Pain is poorly controlled. She will begin OxyContin 20 mg every 12 hours. She will continue oxycodone 10-15 mg every 4 hours as needed. She understands to contact the office with continued poor pain control.  She will return for a follow-up visit and cycle 2 irinotecan/cisplatin on 07/27/2016. She will contact the office in the interim as outlined above or with any other problems.  Plan reviewed with Dr. Benay Spice.  Jenny [name], Jenny [name] ANP/GNP-BC   07/13/2016  10:48 AM

## 2016-07-13 NOTE — Progress Notes (Signed)
Ned Card, NP notified that pt has received approximately 600 ml of hydration fluid and has only voided 150-175 ml of urine at this time.  Per NP continue hydration fluid until reach needed 200 ml of urine output prior to starting chemotherapy.  States she will discuss this with Dr. Benay Spice and get back with this RN.  Per Ned Card, NP okay to proceed with treatment without the minimum of 200 ml of urine output.  States to monitor urine output throughout treatment and give the rest of the hydration fluids after treatment even if it is not a full 2 hours worth of fluids.  Approximately 400 ml of hydration fluid is remaining.

## 2016-07-14 ENCOUNTER — Telehealth: Payer: Self-pay

## 2016-07-14 ENCOUNTER — Telehealth: Payer: Self-pay | Admitting: *Deleted

## 2016-07-14 NOTE — Telephone Encounter (Signed)
Returned call to pt, she does not need Neulasta at this time, per Dr. Benay Spice. She voiced understanding.

## 2016-07-14 NOTE — Telephone Encounter (Signed)
Patient called inquiring if she will she begin receiving her neulasta injections after chemotherapy.

## 2016-07-14 NOTE — Telephone Encounter (Signed)
Pt states she is having the hiccups all morning. Instructed her to call if they don't go away. She also mentioned fatigue. Instructed about rest. "I don't have much choice there" Mentioned water and she is not drinking. Instructed her to drink 4 16oz bottles worth of water per day. Educated about bladder irritation is she does not urinate frequently. She denies nausea or constipation or diarrhea.

## 2016-07-14 NOTE — Telephone Encounter (Signed)
-----   Message from Carolin Guernsey, RN sent at 07/13/2016  3:27 PM EDT ----- Regarding: BS: Chemo f/u call Patient of Dr. Benay Spice, first time irinotecan and cisplatin. Tolerated well.  Please call patient tomorrow 3/13 to f/u.  Thanks!

## 2016-07-15 ENCOUNTER — Telehealth: Payer: Self-pay | Admitting: *Deleted

## 2016-07-15 NOTE — Telephone Encounter (Signed)
"  I had treatment two days ago and have had hiccups ever since.  I need something called in to help the hiccups."

## 2016-07-15 NOTE — Telephone Encounter (Signed)
Message left from patient concerned about hiccups since day of treatment and is requesting medication to stop hiccups.  Dr. Benay Spice notified and call placed back to patient to inform her per order of Dr. Benay Spice that hiccups are most likely caused by Decadron and they should ease off on their own.  Patient appreciative of call back.

## 2016-07-26 ENCOUNTER — Other Ambulatory Visit: Payer: Self-pay | Admitting: Oncology

## 2016-07-27 ENCOUNTER — Ambulatory Visit (HOSPITAL_BASED_OUTPATIENT_CLINIC_OR_DEPARTMENT_OTHER): Payer: BLUE CROSS/BLUE SHIELD | Admitting: Nurse Practitioner

## 2016-07-27 ENCOUNTER — Telehealth: Payer: Self-pay | Admitting: Nurse Practitioner

## 2016-07-27 ENCOUNTER — Ambulatory Visit (HOSPITAL_BASED_OUTPATIENT_CLINIC_OR_DEPARTMENT_OTHER): Payer: BLUE CROSS/BLUE SHIELD

## 2016-07-27 ENCOUNTER — Other Ambulatory Visit (HOSPITAL_BASED_OUTPATIENT_CLINIC_OR_DEPARTMENT_OTHER): Payer: BLUE CROSS/BLUE SHIELD

## 2016-07-27 VITALS — BP 93/64 | HR 81

## 2016-07-27 DIAGNOSIS — C3490 Malignant neoplasm of unspecified part of unspecified bronchus or lung: Secondary | ICD-10-CM

## 2016-07-27 DIAGNOSIS — C7951 Secondary malignant neoplasm of bone: Secondary | ICD-10-CM

## 2016-07-27 DIAGNOSIS — C3432 Malignant neoplasm of lower lobe, left bronchus or lung: Secondary | ICD-10-CM

## 2016-07-27 DIAGNOSIS — Z5111 Encounter for antineoplastic chemotherapy: Secondary | ICD-10-CM

## 2016-07-27 DIAGNOSIS — R634 Abnormal weight loss: Secondary | ICD-10-CM

## 2016-07-27 DIAGNOSIS — C787 Secondary malignant neoplasm of liver and intrahepatic bile duct: Secondary | ICD-10-CM | POA: Diagnosis not present

## 2016-07-27 DIAGNOSIS — G893 Neoplasm related pain (acute) (chronic): Secondary | ICD-10-CM | POA: Diagnosis not present

## 2016-07-27 DIAGNOSIS — K521 Toxic gastroenteritis and colitis: Secondary | ICD-10-CM

## 2016-07-27 LAB — CBC WITH DIFFERENTIAL/PLATELET
BASO%: 0.3 % (ref 0.0–2.0)
Basophils Absolute: 0 10*3/uL (ref 0.0–0.1)
EOS%: 4.3 % (ref 0.0–7.0)
Eosinophils Absolute: 0.2 10*3/uL (ref 0.0–0.5)
HEMATOCRIT: 29.6 % — AB (ref 34.8–46.6)
HGB: 9.4 g/dL — ABNORMAL LOW (ref 11.6–15.9)
LYMPH#: 0.5 10*3/uL — AB (ref 0.9–3.3)
LYMPH%: 9.8 % — ABNORMAL LOW (ref 14.0–49.7)
MCH: 27 pg (ref 25.1–34.0)
MCHC: 31.7 g/dL (ref 31.5–36.0)
MCV: 85.1 fL (ref 79.5–101.0)
MONO#: 0.4 10*3/uL (ref 0.1–0.9)
MONO%: 7.9 % (ref 0.0–14.0)
NEUT%: 77.7 % — ABNORMAL HIGH (ref 38.4–76.8)
NEUTROS ABS: 4.2 10*3/uL (ref 1.5–6.5)
Platelets: 157 10*3/uL (ref 145–400)
RBC: 3.48 10*6/uL — AB (ref 3.70–5.45)
RDW: 20.7 % — AB (ref 11.2–14.5)
WBC: 5.3 10*3/uL (ref 3.9–10.3)

## 2016-07-27 LAB — COMPREHENSIVE METABOLIC PANEL
ALK PHOS: 146 U/L (ref 40–150)
ALT: 8 U/L (ref 0–55)
AST: 12 U/L (ref 5–34)
Albumin: 3.1 g/dL — ABNORMAL LOW (ref 3.5–5.0)
Anion Gap: 12 mEq/L — ABNORMAL HIGH (ref 3–11)
BUN: 12.7 mg/dL (ref 7.0–26.0)
CO2: 22 meq/L (ref 22–29)
Calcium: 9.9 mg/dL (ref 8.4–10.4)
Chloride: 103 mEq/L (ref 98–109)
Creatinine: 0.7 mg/dL (ref 0.6–1.1)
GLUCOSE: 101 mg/dL (ref 70–140)
POTASSIUM: 4.3 meq/L (ref 3.5–5.1)
SODIUM: 138 meq/L (ref 136–145)
Total Bilirubin: 0.22 mg/dL (ref 0.20–1.20)
Total Protein: 6.9 g/dL (ref 6.4–8.3)

## 2016-07-27 LAB — MAGNESIUM: Magnesium: 1.8 mg/dl (ref 1.5–2.5)

## 2016-07-27 MED ORDER — OXYCODONE HCL 5 MG PO TABS: 10.0000 mg | ORAL_TABLET | ORAL | 0 refills | Status: DC | PRN

## 2016-07-27 MED ORDER — POTASSIUM CHLORIDE 2 MEQ/ML IV SOLN
Freq: Once | INTRAVENOUS | Status: AC
  Administered 2016-07-27: 10:00:00 via INTRAVENOUS
  Filled 2016-07-27: qty 10

## 2016-07-27 MED ORDER — SODIUM CHLORIDE 0.9 % IV SOLN
Freq: Once | INTRAVENOUS | Status: AC
  Administered 2016-07-27: 12:00:00 via INTRAVENOUS

## 2016-07-27 MED ORDER — DEXTROSE 5 % IV SOLN
65.0000 mg/m2 | Freq: Once | INTRAVENOUS | Status: AC
  Administered 2016-07-27: 100 mg via INTRAVENOUS
  Filled 2016-07-27: qty 5

## 2016-07-27 MED ORDER — SODIUM CHLORIDE 0.9 % IV SOLN
Freq: Once | INTRAVENOUS | Status: AC
  Administered 2016-07-27: 13:00:00 via INTRAVENOUS
  Filled 2016-07-27: qty 5

## 2016-07-27 MED ORDER — PALONOSETRON HCL INJECTION 0.25 MG/5ML
0.2500 mg | Freq: Once | INTRAVENOUS | Status: AC
  Administered 2016-07-27: 0.25 mg via INTRAVENOUS

## 2016-07-27 MED ORDER — HEPARIN SOD (PORK) LOCK FLUSH 100 UNIT/ML IV SOLN
500.0000 [IU] | Freq: Once | INTRAVENOUS | Status: DC | PRN
  Filled 2016-07-27: qty 5

## 2016-07-27 MED ORDER — SODIUM CHLORIDE 0.9 % IV SOLN
30.0000 mg/m2 | Freq: Once | INTRAVENOUS | Status: AC
  Administered 2016-07-27: 42 mg via INTRAVENOUS
  Filled 2016-07-27: qty 42

## 2016-07-27 MED ORDER — SODIUM CHLORIDE 0.9% FLUSH
10.0000 mL | INTRAVENOUS | Status: DC | PRN
  Filled 2016-07-27: qty 10

## 2016-07-27 NOTE — Telephone Encounter (Signed)
Gave patient AVS and calender per 07/27/2016 los.

## 2016-07-27 NOTE — Patient Instructions (Addendum)
[facility] Discharge Instructions for Patients Receiving Chemotherapy  Today you received the following chemotherapy agents: Irinotecan and Cisplatin   To help prevent nausea and vomiting after your treatment, we encourage you to take your nausea medication as directed.    If you develop nausea and vomiting that is not controlled by your nausea medication, call the clinic.   BELOW ARE SYMPTOMS THAT SHOULD BE REPORTED IMMEDIATELY:  *FEVER GREATER THAN 100.5 F  *CHILLS WITH OR WITHOUT FEVER  NAUSEA AND VOMITING THAT IS NOT CONTROLLED WITH YOUR NAUSEA MEDICATION  *UNUSUAL SHORTNESS OF BREATH  *UNUSUAL BRUISING OR BLEEDING  TENDERNESS IN MOUTH AND THROAT WITH OR WITHOUT PRESENCE OF ULCERS  *URINARY PROBLEMS  *BOWEL PROBLEMS  UNUSUAL RASH Items with * indicate a potential emergency and should be followed up as soon as possible.  Feel free to call the clinic you have any questions or concerns. The clinic phone number is [phone].  Please show the CHEMO ALERT CARD at check-in to the Emergency Department and triage nurse.   

## 2016-07-27 NOTE — Progress Notes (Signed)
[place] OFFICE PROGRESS NOTE   Diagnosis:  Small cell lung cancer  INTERVAL HISTORY:   Ms. Jenny [name] returns as scheduled. She completed cycle 1 irinotecan/cisplatin 07/13/2016. She denies significant nausea. No mouth sores. She has intermittent loose stools controlled with Imodium. No loose stools over the past several days. No numbness or tingling in her hands or feet. Ringing in ears has resolved. She notes improved pain control since beginning OxyContin. She is taking breakthrough pain medication once or twice a day. She notes intermittent abdominal pain after eating. She reports a good appetite. She has lost some weight since her last visit. She attributes this to diarrhea.  Objective:  Vital signs in last 24 hours:  Blood pressure 101/61, pulse (!) 138, temperature 98.1 F (36.7 C), temperature source Oral, resp. rate 19, height 5' (1.524 m), weight 95 lb 14.4 oz (43.5 kg), SpO2 100 %.    HEENT: No thrush or ulcers. Resp: Lungs clear bilaterally. Breath sounds mildly diminished right lung field. No respiratory distress. Cardio: Regular, tachycardic. GI: Abdomen soft and nontender. No hepatomegaly. Vascular: No leg edema. Neuro: Alert and oriented.  Musculoskeletal: Multiple masses over the scalp. Mass at the right mid to low back.   Lab Results:  Lab Results  Component Value Date   WBC 5.3 07/27/2016   HGB 9.4 (L) 07/27/2016   HCT 29.6 (L) 07/27/2016   MCV 85.1 07/27/2016   PLT 157 07/27/2016   NEUTROABS 4.2 07/27/2016    Imaging:  No results found.  Medications: I have reviewed the patient's current medications.  Assessment/Plan: 1. Limited stage small cell lung cancer, diagnosed on biopsy of a subcarinal lymph node and left lung mass by EBUSon 09/12/2015 ? PET scan 09/03/2015 confirmed a hypermetabolic left perihilar mass, 2 left lower lobe pulmonary masses, and mediastinal lymphadenopathy, no evidence of distant metastatic disease ? Brain MRI  negative for metastatic disease 09/13/2015 ? Status post chest radiation 09/26/2015 through 10/18/2015  ? Cycle 1 cisplatin/etoposide beginning 09/26/2015 ? Cycle 2 cisplatin/etoposide beginning 10/16/2015 ? Cycle 3 carboplatin/etoposide beginning 11/06/2015 ? 12/17/2015 MRI of the brain negative ? 12/17/2015 CT scans chest/abdomen/pelvis (report currently not available) ? Bone scan 12/27/2015 with minimal increased uptake in the posterior aspect of the right 11th rib that does not appear abnormal on the CT images of August 2017; sclerotic focus noted on the previous CT scan in the body of L1 showed no abnormal uptake on the current bone scan; asymmetric increased uptake in the left SI joint region possibly corresponding to a subtle sclerotic focus in the medial posterior aspect of the left iliac bone ? CT chest 04/07/2016-new liver metastases, new upper abdominal adenopathy, left iliac metastasis, new right posterior lateral Pleural/chest wall metastasis ? Cycle 1 salvage chemotherapy with etoposide/carboplatin01/12/2016 ? Cycle 2 salvage chemotherapy with etoposide/carboplatin 06/09/2016-Neulasta added ? Restaging CTs of the head, chest, abdomen, and pelvis on 07/06/2016-progressive skull metastases with intracranial/extracranial extension, stable lung nodules, progressive disease in the liver, bones, enlargement of a right chest wall mass, and enlarging peritoneal nodule ? Cycle 1 irinotecan/cisplatin 07/13/2016 ? Cycle 2 irinotecan/cisplatin 07/27/2016  2. 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 Weight loss secondary to #2  4. History of febrile neutropenia following chemotherapy  5. Remote history of a right leg DVT-2008?-Maintained on Lovenox  6. History of tobacco use  7. Admission to [facility] 10/27/2015 with neutropenic fever, esophagitis, and dehydration  8. Hypercalcemia 05/01/2016-status post  intravenous fluids and Zometa12/29/2017  9. Fever  prior to hospital admission 05/17/2016-infectious versus tumor fever; treated for pneumonia, respiratory panel positive for coronavirus  10. 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 Altered mental status-potentially related to infection/delirium, concern for brain metastases; brain CT 05/19/2016-no brain metastases, multiple skull metastases; improved 05/28/2016, 06/22/2016.  12. History of Leukopenia/thrombocytopenia secondary to chemotherapy-Neulasta added with cycle 2 etoposide/carboplatin     Disposition: Jenny [name] appears unchanged. She has completed one cycle of irinotecan/cisplatin. Plan to proceed with cycle 2 today as scheduled.  She has had some diarrhea. This may be related to the irinotecan. The diarrhea is occurring intermittently and responds to Imodium. She understands to contact the office with poorly controlled diarrhea.  She notes improved pain control since beginning OxyContin. She will continue oxycodone as needed and was provided with a new prescription at today's visit.  She will return for a follow-up visit and cycle 3 irinotecan/cisplatin in 2 weeks. She will contact the office in the interim as outlined above or with any other problems.  Plan reviewed with Dr. Benay Spice.    Vernecia, Umble ANP/GNP-BC   07/27/2016  8:43 AM

## 2016-07-27 NOTE — Progress Notes (Signed)
Ned Card, NP notified of only voiding a total of 175 ml with hydration fluid. Okay to proceed with treatment.

## 2016-07-28 ENCOUNTER — Telehealth: Payer: Self-pay | Admitting: *Deleted

## 2016-07-28 NOTE — Telephone Encounter (Signed)
"  I have hiccups again after treatment.  These started this morning as soon as I opened my eyes.  Last time they eased off within a few days.  This time they're pretty hard.  Is there something I should be eating or doing?  Please let me know.  Return number [phone]."

## 2016-07-29 NOTE — Telephone Encounter (Signed)
Called pt, she reports persistent hiccups are painful. Reports it lasted about 3 days last time.  Recommended she try a dose of Compazine to see if this helps. She will try this.

## 2016-07-29 NOTE — Telephone Encounter (Signed)
Returned call to pt, she reports relief of hiccups after taking Compazine. Instructed her to take half to one tablet every 6-8 hours PRN. She voiced understanding.

## 2016-07-31 ENCOUNTER — Ambulatory Visit (HOSPITAL_BASED_OUTPATIENT_CLINIC_OR_DEPARTMENT_OTHER): Payer: BLUE CROSS/BLUE SHIELD

## 2016-07-31 ENCOUNTER — Telehealth: Payer: Self-pay | Admitting: *Deleted

## 2016-07-31 ENCOUNTER — Ambulatory Visit (HOSPITAL_BASED_OUTPATIENT_CLINIC_OR_DEPARTMENT_OTHER): Payer: BLUE CROSS/BLUE SHIELD | Admitting: Oncology

## 2016-07-31 VITALS — BP 85/56 | HR 142 | Temp 97.9°F | Resp 18 | Ht 60.0 in | Wt 95.2 lb

## 2016-07-31 DIAGNOSIS — C3490 Malignant neoplasm of unspecified part of unspecified bronchus or lung: Secondary | ICD-10-CM | POA: Diagnosis not present

## 2016-07-31 DIAGNOSIS — C787 Secondary malignant neoplasm of liver and intrahepatic bile duct: Secondary | ICD-10-CM | POA: Diagnosis not present

## 2016-07-31 DIAGNOSIS — R110 Nausea: Secondary | ICD-10-CM

## 2016-07-31 DIAGNOSIS — C7951 Secondary malignant neoplasm of bone: Secondary | ICD-10-CM

## 2016-07-31 DIAGNOSIS — C3432 Malignant neoplasm of lower lobe, left bronchus or lung: Secondary | ICD-10-CM | POA: Diagnosis not present

## 2016-07-31 MED ORDER — SODIUM CHLORIDE 0.9 % IV SOLN
Freq: Once | INTRAVENOUS | Status: AC
  Administered 2016-07-31: 15:00:00 via INTRAVENOUS

## 2016-07-31 MED ORDER — ONDANSETRON 8 MG PO TBDP: 8.0000 mg | ORAL_TABLET | Freq: Three times a day (TID) | ORAL | 1 refills | Status: DC | PRN

## 2016-07-31 MED ORDER — DEXAMETHASONE SODIUM PHOSPHATE 10 MG/ML IJ SOLN
INTRAMUSCULAR | Status: AC
  Filled 2016-07-31: qty 1

## 2016-07-31 MED ORDER — DEXAMETHASONE SODIUM PHOSPHATE 10 MG/ML IJ SOLN
10.0000 mg | Freq: Once | INTRAMUSCULAR | Status: AC
  Administered 2016-07-31: 10 mg via INTRAVENOUS

## 2016-07-31 NOTE — Patient Instructions (Signed)
Dehydration, Adult Dehydration is a condition in which there is not enough fluid or water in the body. This happens when you lose more fluids than you take in. Important organs, such as the kidneys, brain, and heart, cannot function without a proper amount of fluids. Any loss of fluids from the body can lead to dehydration. Dehydration can range from mild to severe. This condition should be treated right away to prevent it from becoming severe. What are the causes? This condition may be caused by:  Vomiting.  Diarrhea.  Excessive sweating, such as from heat exposure or exercise.  Not drinking enough fluid, especially:  When ill.  While doing activity that requires a lot of energy.  Excessive urination.  Fever.  Infection.  Certain medicines, such as medicines that cause the body to lose excess fluid (diuretics).  Inability to access safe drinking water.  Reduced physical ability to get adequate water and food. What increases the risk? This condition is more likely to develop in people:  Who have a poorly controlled long-term (chronic) illness, such as diabetes, heart disease, or kidney disease.  Who are age 65 or older.  Who are disabled.  Who live in a place with high altitude.  Who play endurance sports. What are the signs or symptoms? Symptoms of mild dehydration may include:   Thirst.  Dry lips.  Slightly dry mouth.  Dry, warm skin.  Dizziness. Symptoms of moderate dehydration may include:   Very dry mouth.  Muscle cramps.  Dark urine. Urine may be the color of tea.  Decreased urine production.  Decreased tear production.  Heartbeat that is irregular or faster than normal (palpitations).  Headache.  Light-headedness, especially when you stand up from a sitting position.  Fainting (syncope). Symptoms of severe dehydration may include:   Changes in skin, such as:  Cold and clammy skin.  Blotchy (mottled) or pale skin.  Skin that does  not quickly return to normal after being lightly pinched and released (poor skin turgor).  Changes in body fluids, such as:  Extreme thirst.  No tear production.  Inability to sweat when body temperature is high, such as in hot weather.  Very little urine production.  Changes in vital signs, such as:  Weak pulse.  Pulse that is more than 100 beats a minute when sitting still.  Rapid breathing.  Low blood pressure.  Other changes, such as:  Sunken eyes.  Cold hands and feet.  Confusion.  Lack of energy (lethargy).  Difficulty waking up from sleep.  Short-term weight loss.  Unconsciousness. How is this diagnosed? This condition is diagnosed based on your symptoms and a physical exam. Blood and urine tests may be done to help confirm the diagnosis. How is this treated? Treatment for this condition depends on the severity. Mild or moderate dehydration can often be treated at home. Treatment should be started right away. Do not wait until dehydration becomes severe. Severe dehydration is an emergency and it needs to be treated in a hospital. Treatment for mild dehydration may include:   Drinking more fluids.  Replacing salts and minerals in your blood (electrolytes) that you may have lost. Treatment for moderate dehydration may include:   Drinking an oral rehydration solution (ORS). This is a drink that helps you replace fluids and electrolytes (rehydrate). It can be found at pharmacies and retail stores. Treatment for severe dehydration may include:   Receiving fluids through an IV tube.  Receiving an electrolyte solution through a feeding tube that is   passed through your nose and into your stomach (nasogastric tube, or NG tube).  Correcting any abnormalities in electrolytes.  Treating the underlying cause of dehydration. Follow these instructions at home:  If directed by your health care provider, drink an ORS:  Make an ORS by following instructions on the  package.  Start by drinking small amounts, about  cup (120 mL) every 5-10 minutes.  Slowly increase how much you drink until you have taken the amount recommended by your health care provider.  Drink enough clear fluid to keep your urine clear or pale yellow. If you were told to drink an ORS, finish the ORS first, then start slowly drinking other clear fluids. Drink fluids such as:  Water. Do not drink only water. Doing that can lead to having too little salt (sodium) in the body (hyponatremia).  Ice chips.  Fruit juice that you have added water to (diluted fruit juice).  Low-calorie sports drinks.  Avoid:  Alcohol.  Drinks that contain a lot of sugar. These include high-calorie sports drinks, fruit juice that is not diluted, and soda.  Caffeine.  Foods that are greasy or contain a lot of fat or sugar.  Take over-the-counter and prescription medicines only as told by your health care provider.  Do not take sodium tablets. This can lead to having too much sodium in the body (hypernatremia).  Eat foods that contain a healthy balance of electrolytes, such as bananas, oranges, potatoes, tomatoes, and spinach.  Keep all follow-up visits as told by your health care provider. This is important. Contact a health care provider if:  You have abdominal pain that:  Gets worse.  Stays in one area (localizes).  You have a rash.  You have a stiff neck.  You are more irritable than usual.  You are sleepier or more difficult to wake up than usual.  You feel weak or dizzy.  You feel very thirsty.  You have urinated only a small amount of very dark urine over 6-8 hours. Get help right away if:  You have symptoms of severe dehydration.  You cannot drink fluids without vomiting.  Your symptoms get worse with treatment.  You have a fever.  You have a severe headache.  You have vomiting or diarrhea that:  Gets worse.  Does not go away.  You have blood or green matter  (bile) in your vomit.  You have blood in your stool. This may cause stool to look black and tarry.  You have not urinated in 6-8 hours.  You faint.  Your heart rate while sitting still is over 100 beats a minute.  You have trouble breathing. This information is not intended to replace advice given to you by your health care provider. Make sure you discuss any questions you have with your health care provider. Document Released: 04/20/2005 Document Revised: 11/15/2015 Document Reviewed: 06/14/2015 Elsevier Interactive Patient Education  2017 [employer]  

## 2016-07-31 NOTE — Progress Notes (Signed)
[place] OFFICE PROGRESS NOTE   Diagnosis: Small cell lung cancer  INTERVAL HISTORY:   Ms. Saffo returns prior to a scheduled visit. She completed a second treatment with irinotecan/cisplatin on 07/27/2016. She complains of a headache that is not relieved with Tylenol. The pain at the right back has improved. She has been taking oxycodone at a dose of 20 mg twice daily (the pharmacy gave her oxycodone as opposed to OxyContin in error) with up to 15 mg of oxycodone 3-4 times per day for breakthrough pain. She complains of nausea, but no vomiting. Her oral intake is limited.  Objective:  Vital signs in last 24 hours:  Blood pressure (!) 85/56, pulse (!) 142, temperature 97.9 F (36.6 C), temperature source Oral, resp. rate 18, height 5' (1.524 m), weight 95 lb 3.2 oz (43.2 kg), SpO2 100 %.    HEENT: No thrush or ulcers Resp: Lungs clear bilaterally Cardio: Regular rate and rhythm, tachycardia GI: No hepatosplenomegaly, nontender Vascular: No leg edema Musculoskeletal: Right posterior chest wall mass-smaller  Skin: Multiple skull base/cutaneous metastases at the parietal scalp appears slightly smaller Neurologic: The tongue deviates to the left, the extraocular movements appear intact. The speech is fluent. The face is symmetric.     Lab Results:  Lab Results  Component Value Date   WBC 5.3 07/27/2016   HGB 9.4 (L) 07/27/2016   HCT 29.6 (L) 07/27/2016   MCV 85.1 07/27/2016   PLT 157 07/27/2016   NEUTROABS 4.2 07/27/2016     Medications: I have reviewed the patient's current medications.  Assessment/Plan: 1.Limited stage small cell lung cancer, diagnosed on biopsy of a subcarinal lymph node and left lung mass by EBUSon 09/12/2015  PET scan 09/03/2015 confirmed a hypermetabolic left perihilar mass, 2 left lower lobe pulmonary masses, and mediastinal lymphadenopathy, no evidence of distant metastatic disease  Brain MRI negative for metastatic disease  09/13/2015  Status post chest radiation 09/26/2015 through 10/18/2015  Cycle 1 cisplatin/etoposide beginning 09/26/2015  Cycle 2 cisplatin/etoposide beginning 10/16/2015  Cycle 3 carboplatin/etoposide beginning 11/06/2015  12/17/2015 MRI of the brain negative  12/17/2015 CT scans chest/abdomen/pelvis (report currently not available)  Bone scan 12/27/2015 with minimal increased uptake in the posterior aspect of the right 11th rib that does not appear abnormal on the CT images of August 2017; sclerotic focus noted on the previous CT scan in the body of L1 showed no abnormal uptake on the current bone scan; asymmetric increased uptake in the left SI joint region possibly corresponding to a subtle sclerotic focus in the medial posterior aspect of the left iliac bone  CT chest 04/07/2016-new liver metastases, new upper abdominal adenopathy, left iliac metastasis, new right posterior lateral Pleural/chest wall metastasis  Cycle 1 salvage chemotherapy with etoposide/carboplatin01/12/2016  Cycle 2 salvage chemotherapy with etoposide/carboplatin 06/09/2016-Neulasta added  Restaging CTs of the head, chest, abdomen, and pelvis on 07/06/2016-progressive skull metastases with intracranial/extracranial extension, stable lung nodules, progressive disease in the liver, bones, enlargement of a right chest wall mass, and enlarging peritoneal nodule  Cycle 1 irinotecan/cisplatin 07/13/2016  Cycle 2 irinotecan/cisplatin 07/27/2016  2. 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 Weight loss secondary to #2  4. History of febrile neutropenia following chemotherapy  5. Remote history of a right leg DVT-2008?-Maintained on Lovenox  6. History of tobacco use  7. Admission to [facility] 10/27/2015 with neutropenic fever, esophagitis, and dehydration  8. Hypercalcemia 05/01/2016-status post intravenous fluids and  Zometa12/29/2017  9. Fever prior to  hospital admission 05/17/2016-infectious versus tumor fever; treated for pneumonia, respiratory panel positive for coronavirus  10. 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 Altered mental status-potentially related to infection/delirium, concern for brain metastases; brain CT 05/19/2016-no brain metastases, multiple skull metastases; improved 05/28/2016, 06/22/2016.  12. History of Leukopenia/thrombocytopenia secondary to chemotherapy-Neulasta added with cycle 2 etoposide/carboplatin    Disposition:  Ms. Haskins completed a second treatment with irinotecan/cisplatin on 07/27/2016. She presents today with nausea. She likely has delayed nausea from chemotherapy. She will receive intravenous fluids and Decadron. We will repeat orthostatic vital signs after the fluids today.  The headache could be related to the use of Zofran, skull metastases, or another etiology. Hopefully the headache will improve with Decadron.  The tongue deviates to the left today. This could be related to skull base metastases or carcinomatous meningitis. We will reassess this when she returns on 81. She will contact us sooner for new neurologic symptoms.  25 minutes were spent with the patient today. The majority of the time was used for counseling and coordination of care.  Betsy Coder, MD  07/31/2016  2:57 PM

## 2016-07-31 NOTE — Telephone Encounter (Signed)
Called to check on pt. She feels a little better after medications. She would like to come in to see a provider. Instructed her to come to office now. She can be here in :30.

## 2016-07-31 NOTE — Telephone Encounter (Signed)
Returned call to pt. She is taking Compazine for nausea with no relief. She is not taking Zofran. She also reports severe head pain. She has not taken Oxycodone this morning. Instructed her to take pain pill. Will refill Zofran.

## 2016-07-31 NOTE — Telephone Encounter (Signed)
Received call from pt c/o h/a, tired, stomach ache, &  Nausea.  She states "I just don't feel well & I don't know what is going on."  She denies vomiting, bowel problems , fever.  She states she didn't sleep well last night & symptoms started yest. Instructed to take compazine now. Message to Dr Sherrill/Pod RN

## 2016-08-01 ENCOUNTER — Other Ambulatory Visit: Payer: Self-pay | Admitting: *Deleted

## 2016-08-01 ENCOUNTER — Ambulatory Visit (HOSPITAL_BASED_OUTPATIENT_CLINIC_OR_DEPARTMENT_OTHER): Payer: BLUE CROSS/BLUE SHIELD

## 2016-08-01 VITALS — BP 98/56 | HR 115 | Temp 97.7°F | Resp 16

## 2016-08-01 DIAGNOSIS — C3490 Malignant neoplasm of unspecified part of unspecified bronchus or lung: Secondary | ICD-10-CM

## 2016-08-01 DIAGNOSIS — C3432 Malignant neoplasm of lower lobe, left bronchus or lung: Secondary | ICD-10-CM

## 2016-08-01 MED ORDER — DEXAMETHASONE SODIUM PHOSPHATE 10 MG/ML IJ SOLN: 10.0000 mg | Freq: Once | INTRAMUSCULAR | Status: DC

## 2016-08-01 MED ORDER — DEXAMETHASONE SODIUM PHOSPHATE 10 MG/ML IJ SOLN
10.0000 mg | Freq: Once | INTRAMUSCULAR | Status: AC
  Administered 2016-08-01: 10 mg via INTRAVENOUS

## 2016-08-01 MED ORDER — SODIUM CHLORIDE 0.9 % IV SOLN
INTRAVENOUS | Status: DC
  Administered 2016-08-01: 11:00:00 via INTRAVENOUS

## 2016-08-01 MED ORDER — SODIUM CHLORIDE 0.9 % IV SOLN
10.0000 mg | Freq: Once | INTRAVENOUS | Status: DC
  Administered 2016-08-01: 10 mg via INTRAVENOUS

## 2016-08-01 NOTE — Patient Instructions (Signed)
Dehydration, Adult Dehydration is a condition in which there is not enough fluid or water in the body. This happens when you lose more fluids than you take in. Important organs, such as the kidneys, brain, and heart, cannot function without a proper amount of fluids. Any loss of fluids from the body can lead to dehydration. Dehydration can range from mild to severe. This condition should be treated right away to prevent it from becoming severe. What are the causes? This condition may be caused by:  Vomiting.  Diarrhea.  Excessive sweating, such as from heat exposure or exercise.  Not drinking enough fluid, especially:  When ill.  While doing activity that requires a lot of energy.  Excessive urination.  Fever.  Infection.  Certain medicines, such as medicines that cause the body to lose excess fluid (diuretics).  Inability to access safe drinking water.  Reduced physical ability to get adequate water and food. What increases the risk? This condition is more likely to develop in people:  Who have a poorly controlled long-term (chronic) illness, such as diabetes, heart disease, or kidney disease.  Who are age 65 or older.  Who are disabled.  Who live in a place with high altitude.  Who play endurance sports. What are the signs or symptoms? Symptoms of mild dehydration may include:   Thirst.  Dry lips.  Slightly dry mouth.  Dry, warm skin.  Dizziness. Symptoms of moderate dehydration may include:   Very dry mouth.  Muscle cramps.  Dark urine. Urine may be the color of tea.  Decreased urine production.  Decreased tear production.  Heartbeat that is irregular or faster than normal (palpitations).  Headache.  Light-headedness, especially when you stand up from a sitting position.  Fainting (syncope). Symptoms of severe dehydration may include:   Changes in skin, such as:  Cold and clammy skin.  Blotchy (mottled) or pale skin.  Skin that does  not quickly return to normal after being lightly pinched and released (poor skin turgor).  Changes in body fluids, such as:  Extreme thirst.  No tear production.  Inability to sweat when body temperature is high, such as in hot weather.  Very little urine production.  Changes in vital signs, such as:  Weak pulse.  Pulse that is more than 100 beats a minute when sitting still.  Rapid breathing.  Low blood pressure.  Other changes, such as:  Sunken eyes.  Cold hands and feet.  Confusion.  Lack of energy (lethargy).  Difficulty waking up from sleep.  Short-term weight loss.  Unconsciousness. How is this diagnosed? This condition is diagnosed based on your symptoms and a physical exam. Blood and urine tests may be done to help confirm the diagnosis. How is this treated? Treatment for this condition depends on the severity. Mild or moderate dehydration can often be treated at home. Treatment should be started right away. Do not wait until dehydration becomes severe. Severe dehydration is an emergency and it needs to be treated in a hospital. Treatment for mild dehydration may include:   Drinking more fluids.  Replacing salts and minerals in your blood (electrolytes) that you may have lost. Treatment for moderate dehydration may include:   Drinking an oral rehydration solution (ORS). This is a drink that helps you replace fluids and electrolytes (rehydrate). It can be found at pharmacies and retail stores. Treatment for severe dehydration may include:   Receiving fluids through an IV tube.  Receiving an electrolyte solution through a feeding tube that is   passed through your nose and into your stomach (nasogastric tube, or NG tube).  Correcting any abnormalities in electrolytes.  Treating the underlying cause of dehydration. Follow these instructions at home:  If directed by your health care provider, drink an ORS:  Make an ORS by following instructions on the  package.  Start by drinking small amounts, about  cup (120 mL) every 5-10 minutes.  Slowly increase how much you drink until you have taken the amount recommended by your health care provider.  Drink enough clear fluid to keep your urine clear or pale yellow. If you were told to drink an ORS, finish the ORS first, then start slowly drinking other clear fluids. Drink fluids such as:  Water. Do not drink only water. Doing that can lead to having too little salt (sodium) in the body (hyponatremia).  Ice chips.  Fruit juice that you have added water to (diluted fruit juice).  Low-calorie sports drinks.  Avoid:  Alcohol.  Drinks that contain a lot of sugar. These include high-calorie sports drinks, fruit juice that is not diluted, and soda.  Caffeine.  Foods that are greasy or contain a lot of fat or sugar.  Take over-the-counter and prescription medicines only as told by your health care provider.  Do not take sodium tablets. This can lead to having too much sodium in the body (hypernatremia).  Eat foods that contain a healthy balance of electrolytes, such as bananas, oranges, potatoes, tomatoes, and spinach.  Keep all follow-up visits as told by your health care provider. This is important. Contact a health care provider if:  You have abdominal pain that:  Gets worse.  Stays in one area (localizes).  You have a rash.  You have a stiff neck.  You are more irritable than usual.  You are sleepier or more difficult to wake up than usual.  You feel weak or dizzy.  You feel very thirsty.  You have urinated only a small amount of very dark urine over 6-8 hours. Get help right away if:  You have symptoms of severe dehydration.  You cannot drink fluids without vomiting.  Your symptoms get worse with treatment.  You have a fever.  You have a severe headache.  You have vomiting or diarrhea that:  Gets worse.  Does not go away.  You have blood or green matter  (bile) in your vomit.  You have blood in your stool. This may cause stool to look black and tarry.  You have not urinated in 6-8 hours.  You faint.  Your heart rate while sitting still is over 100 beats a minute.  You have trouble breathing. This information is not intended to replace advice given to you by your health care provider. Make sure you discuss any questions you have with your health care provider. Document Released: 04/20/2005 Document Revised: 11/15/2015 Document Reviewed: 06/14/2015 Elsevier Interactive Patient Education  2017 [employer]  

## 2016-08-03 ENCOUNTER — Ambulatory Visit: Payer: Self-pay | Admitting: Oncology

## 2016-08-10 ENCOUNTER — Ambulatory Visit (HOSPITAL_BASED_OUTPATIENT_CLINIC_OR_DEPARTMENT_OTHER): Payer: BLUE CROSS/BLUE SHIELD

## 2016-08-10 ENCOUNTER — Other Ambulatory Visit (HOSPITAL_BASED_OUTPATIENT_CLINIC_OR_DEPARTMENT_OTHER): Payer: BLUE CROSS/BLUE SHIELD

## 2016-08-10 ENCOUNTER — Ambulatory Visit (HOSPITAL_BASED_OUTPATIENT_CLINIC_OR_DEPARTMENT_OTHER): Payer: BLUE CROSS/BLUE SHIELD | Admitting: Oncology

## 2016-08-10 VITALS — BP 83/59 | HR 133 | Temp 98.5°F | Resp 18 | Ht 60.0 in | Wt 95.4 lb

## 2016-08-10 VITALS — BP 104/72 | HR 101

## 2016-08-10 DIAGNOSIS — C3490 Malignant neoplasm of unspecified part of unspecified bronchus or lung: Secondary | ICD-10-CM

## 2016-08-10 DIAGNOSIS — G893 Neoplasm related pain (acute) (chronic): Secondary | ICD-10-CM

## 2016-08-10 DIAGNOSIS — C787 Secondary malignant neoplasm of liver and intrahepatic bile duct: Secondary | ICD-10-CM

## 2016-08-10 DIAGNOSIS — Z5111 Encounter for antineoplastic chemotherapy: Secondary | ICD-10-CM

## 2016-08-10 DIAGNOSIS — D6481 Anemia due to antineoplastic chemotherapy: Secondary | ICD-10-CM

## 2016-08-10 DIAGNOSIS — C7951 Secondary malignant neoplasm of bone: Secondary | ICD-10-CM

## 2016-08-10 DIAGNOSIS — D630 Anemia in neoplastic disease: Secondary | ICD-10-CM

## 2016-08-10 DIAGNOSIS — R634 Abnormal weight loss: Secondary | ICD-10-CM

## 2016-08-10 LAB — CBC WITH DIFFERENTIAL/PLATELET
BASO%: 0.3 % (ref 0.0–2.0)
BASOS ABS: 0 10*3/uL (ref 0.0–0.1)
EOS ABS: 0 10*3/uL (ref 0.0–0.5)
EOS%: 0.1 % (ref 0.0–7.0)
HCT: 29.3 % — ABNORMAL LOW (ref 34.8–46.6)
HEMOGLOBIN: 9.5 g/dL — AB (ref 11.6–15.9)
LYMPH%: 6.1 % — ABNORMAL LOW (ref 14.0–49.7)
MCH: 27.6 pg (ref 25.1–34.0)
MCHC: 32.4 g/dL (ref 31.5–36.0)
MCV: 85.2 fL (ref 79.5–101.0)
MONO#: 1.6 10*3/uL — ABNORMAL HIGH (ref 0.1–0.9)
MONO%: 16.6 % — AB (ref 0.0–14.0)
NEUT#: 7.5 10*3/uL — ABNORMAL HIGH (ref 1.5–6.5)
NEUT%: 76.9 % — ABNORMAL HIGH (ref 38.4–76.8)
Platelets: 228 10*3/uL (ref 145–400)
RBC: 3.44 10*6/uL — AB (ref 3.70–5.45)
RDW: 19.8 % — AB (ref 11.2–14.5)
WBC: 9.7 10*3/uL (ref 3.9–10.3)
lymph#: 0.6 10*3/uL — ABNORMAL LOW (ref 0.9–3.3)

## 2016-08-10 LAB — COMPREHENSIVE METABOLIC PANEL
ALBUMIN: 3 g/dL — AB (ref 3.5–5.0)
ALK PHOS: 143 U/L (ref 40–150)
ALT: 8 U/L (ref 0–55)
AST: 15 U/L (ref 5–34)
Anion Gap: 12 mEq/L — ABNORMAL HIGH (ref 3–11)
BILIRUBIN TOTAL: 0.38 mg/dL (ref 0.20–1.20)
BUN: 11.7 mg/dL (ref 7.0–26.0)
CHLORIDE: 100 meq/L (ref 98–109)
CO2: 22 mEq/L (ref 22–29)
Calcium: 9.5 mg/dL (ref 8.4–10.4)
Creatinine: 0.8 mg/dL (ref 0.6–1.1)
EGFR: 79 mL/min/{1.73_m2} — AB (ref >90)
GLUCOSE: 142 mg/dL — AB (ref 70–140)
POTASSIUM: 4.4 meq/L (ref 3.5–5.1)
SODIUM: 134 meq/L — AB (ref 136–145)
Total Protein: 6.4 g/dL (ref 6.4–8.3)

## 2016-08-10 LAB — MAGNESIUM: Magnesium: 1.5 mg/dl (ref 1.5–2.5)

## 2016-08-10 MED ORDER — SODIUM CHLORIDE 0.9 % IV SOLN
Freq: Once | INTRAVENOUS | Status: AC
  Administered 2016-08-10: 15:00:00 via INTRAVENOUS
  Filled 2016-08-10: qty 5

## 2016-08-10 MED ORDER — SODIUM CHLORIDE 0.9 % IV SOLN
30.0000 mg/m2 | Freq: Once | INTRAVENOUS | Status: AC
  Administered 2016-08-10: 41 mg via INTRAVENOUS
  Filled 2016-08-10: qty 41

## 2016-08-10 MED ORDER — HEPARIN SOD (PORK) LOCK FLUSH 100 UNIT/ML IV SOLN
500.0000 [IU] | Freq: Once | INTRAVENOUS | Status: DC | PRN
  Filled 2016-08-10: qty 5

## 2016-08-10 MED ORDER — SODIUM CHLORIDE 0.9 % IV SOLN
Freq: Once | INTRAVENOUS | Status: AC
  Administered 2016-08-10: 14:00:00 via INTRAVENOUS

## 2016-08-10 MED ORDER — SODIUM CHLORIDE 0.9% FLUSH
10.0000 mL | INTRAVENOUS | Status: DC | PRN
  Filled 2016-08-10: qty 10

## 2016-08-10 MED ORDER — PALONOSETRON HCL INJECTION 0.25 MG/5ML
0.2500 mg | Freq: Once | INTRAVENOUS | Status: AC
  Administered 2016-08-10: 0.25 mg via INTRAVENOUS

## 2016-08-10 MED ORDER — POTASSIUM CHLORIDE 2 MEQ/ML IV SOLN
Freq: Once | INTRAVENOUS | Status: AC
  Administered 2016-08-10: 11:00:00 via INTRAVENOUS
  Filled 2016-08-10: qty 10

## 2016-08-10 MED ORDER — DEXTROSE 5 % IV SOLN
65.0000 mg/m2 | Freq: Once | INTRAVENOUS | Status: AC
  Administered 2016-08-10: 80 mg via INTRAVENOUS
  Filled 2016-08-10: qty 4

## 2016-08-10 NOTE — Patient Instructions (Signed)
[facility] Discharge Instructions for Patients Receiving Chemotherapy  Today you received the following chemotherapy agents Irinotecan and Cisplatin   To help prevent nausea and vomiting after your treatment, we encourage you to take your nausea medication as directed. No Zofran for 3 days. Take Compazine instead.    If you develop nausea and vomiting that is not controlled by your nausea medication, call the clinic.   BELOW ARE SYMPTOMS THAT SHOULD BE REPORTED IMMEDIATELY:  *FEVER GREATER THAN 100.5 F  *CHILLS WITH OR WITHOUT FEVER  NAUSEA AND VOMITING THAT IS NOT CONTROLLED WITH YOUR NAUSEA MEDICATION  *UNUSUAL SHORTNESS OF BREATH  *UNUSUAL BRUISING OR BLEEDING  TENDERNESS IN MOUTH AND THROAT WITH OR WITHOUT PRESENCE OF ULCERS  *URINARY PROBLEMS  *BOWEL PROBLEMS  UNUSUAL RASH Items with * indicate a potential emergency and should be followed up as soon as possible.  Feel free to call the clinic you have any questions or concerns. The clinic phone number is [phone].  Please show the [facility] at check-in to the Emergency Department and triage nurse.

## 2016-08-10 NOTE — Progress Notes (Signed)
[place] OFFICE PROGRESS NOTE   Diagnosis: Small cell lung cancer  INTERVAL HISTORY:   Jenny [name] returns as scheduled. She reports improvement in the right back pain and headache. She complains of pain at the right forearm for the past 2 days. She stays in the house most of the time. She states that she feels better compared to before irinotecan/cisplatin.  Objective:  Vital signs in last 24 hours:  Blood pressure (!) 83/59, pulse (!) 133, temperature 98.5 F (36.9 C), temperature source Oral, resp. rate 18, height 5' (1.524 m), weight 95 lb 6.4 oz (43.3 kg), SpO2 99 %.    HEENT: No thrush or ulcers, multiple scalp masses appear stable Resp: Decreased breath sounds at the right lower chest, no respiratory distress Cardio: Tachycardia, regular rate and rhythm GI: No hepatosplenomegaly, nontender Vascular: No leg edema Neuro: Alert and oriented, the tongue deviates to the left  Musculoskeletal: Mass like fullness at the right posterior chest wall appears smaller, there is tenderness at the upper right forearm with enlargement of the soft tissue inferior to the elbow. Palpable cord at the right elbow. Examination of the right upper arm is unremarkable. No lower arm or hand edema    Lab Results:  Lab Results  Component Value Date   WBC 9.7 08/10/2016   HGB 9.5 (L) 08/10/2016   HCT 29.3 (L) 08/10/2016   MCV 85.2 08/10/2016   PLT 228 08/10/2016   NEUTROABS 7.5 (H) 08/10/2016    Potassium 4.4, creatinine 0.8, magnesium 1.5, albumin 3.0 Medications: I have reviewed the patient's current medications.  Assessment/Plan: 1.Limited stage small cell lung cancer, diagnosed on biopsy of a subcarinal lymph node and left lung mass by EBUSon 09/12/2015  PET scan 09/03/2015 confirmed a hypermetabolic left perihilar mass, 2 left lower lobe pulmonary masses, and mediastinal lymphadenopathy, no evidence of distant metastatic disease  Brain MRI negative for metastatic  disease 09/13/2015  Status post chest radiation 09/26/2015 through 10/18/2015  Cycle 1 cisplatin/etoposide beginning 09/26/2015  Cycle 2 cisplatin/etoposide beginning 10/16/2015  Cycle 3 carboplatin/etoposide beginning 11/06/2015  12/17/2015 MRI of the brain negative  12/17/2015 CT scans chest/abdomen/pelvis (report currently not available)  Bone scan 12/27/2015 with minimal increased uptake in the posterior aspect of the right 11th rib that does not appear abnormal on the CT images of August 2017; sclerotic focus noted on the previous CT scan in the body of L1 showed no abnormal uptake on the current bone scan; asymmetric increased uptake in the left SI joint region possibly corresponding to a subtle sclerotic focus in the medial posterior aspect of the left iliac bone  CT chest 04/07/2016-new liver metastases, new upper abdominal adenopathy, left iliac metastasis, new right posterior lateral Pleural/chest wall metastasis  Cycle 1 salvage chemotherapy with etoposide/carboplatin01/12/2016  Cycle 2 salvage chemotherapy with etoposide/carboplatin 06/09/2016-Neulasta added  Restaging CTs of the head, chest, abdomen, and pelvis on 07/06/2016-progressive skull metastases with intracranial/extracranial extension, stable lung nodules, progressive disease in the liver, bones, enlargement of a right chest wall mass, and enlarging peritoneal nodule  Cycle 1 irinotecan/cisplatin 07/13/2016  Cycle 2 irinotecan/cisplatin 07/27/2016  Cycle 3 irinotecan/cisplatin 08/10/2016  2. 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 Weight loss secondary to #2  4. History of febrile neutropenia following chemotherapy  5. Remote history of a right leg DVT-2008?-Maintained on Lovenox  6. History of tobacco use  7. Admission to [facility] 10/27/2015 with neutropenic fever, esophagitis, and dehydration  8. Hypercalcemia  05/01/2016-status post intravenous  fluids and Zometa12/29/2017  9. Fever prior to hospital admission 05/17/2016-infectious versus tumor fever; treated for pneumonia, respiratory panel positive for coronavirus  10. 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 Altered mental status-potentially related to infection/delirium, concern for brain metastases; brain CT 05/19/2016-no brain metastases, multiple skull metastases; improved 05/28/2016, 06/22/2016.  12. History of Leukopenia/thrombocytopenia secondary to chemotherapy-Neulasta added with cycle 2 etoposide/carboplatin      Disposition:  Her overall status appears unchanged. The scalp masses appear stable versus slightly smaller since beginning irinotecan/cisplatin. He right posterior chest wall mass appears significantly smaller. I am concerned she may have a mass at the right forearm. We will refer her for a CT.  The plan is to proceed with another treatment with irinotecan/cisplatin today. Ms. Prezioso will return for an office visit next week. She will continue the current narcotic pain regimen.  She will receive intravenous fluids prior to chemotherapy today. We will repeat the pulse prior to chemotherapy. She has a history of chronic tachycardia.   Betsy Coder, MD  08/10/2016  10:19 AM

## 2016-08-10 NOTE — Progress Notes (Signed)
Ok to start hyrdation fluids and pre-medications with HR per MD Benay Spice.   After 2 hours of pre-hydration fluids urine output 32m. MD Sherrill notified. Ok to continue hydration fluids for another hour per MD SBenay Spice   After 3 hours of pre-hydration fluids, urine output is 1334m HR 101. Per MD ShBenay Spiceok to proceed with treatment.

## 2016-08-13 ENCOUNTER — Telehealth: Payer: Self-pay | Admitting: *Deleted

## 2016-08-13 ENCOUNTER — Telehealth: Payer: Self-pay | Admitting: Oncology

## 2016-08-13 NOTE — Telephone Encounter (Signed)
Message from pt, she has not heard from scheduling. Called central scheduling, requested they contact pt with appt.

## 2016-08-13 NOTE — Telephone Encounter (Signed)
sw pt to confirm 4/16 appt at 145 pm per LOS

## 2016-08-14 ENCOUNTER — Other Ambulatory Visit: Payer: Self-pay | Admitting: Nurse Practitioner

## 2016-08-14 ENCOUNTER — Ambulatory Visit (HOSPITAL_COMMUNITY)
Admission: RE | Admit: 2016-08-14 | Discharge: 2016-08-14 | Disposition: A | Discharge status: Home / Self Care | Payer: BLUE CROSS/BLUE SHIELD | Source: Ambulatory Visit | Attending: Oncology | Admitting: Oncology

## 2016-08-14 ENCOUNTER — Telehealth: Payer: Self-pay | Admitting: *Deleted

## 2016-08-14 ENCOUNTER — Telehealth: Payer: Self-pay | Admitting: Nurse Practitioner

## 2016-08-14 DIAGNOSIS — M79631 Pain in right forearm: Secondary | ICD-10-CM | POA: Insufficient documentation

## 2016-08-14 DIAGNOSIS — C7951 Secondary malignant neoplasm of bone: Secondary | ICD-10-CM | POA: Diagnosis not present

## 2016-08-14 DIAGNOSIS — C3490 Malignant neoplasm of unspecified part of unspecified bronchus or lung: Secondary | ICD-10-CM | POA: Diagnosis present

## 2016-08-14 DIAGNOSIS — M7989 Other specified soft tissue disorders: Secondary | ICD-10-CM | POA: Insufficient documentation

## 2016-08-14 MED ORDER — OXYCODONE HCL 5 MG PO TABS: 10.0000 mg | ORAL_TABLET | ORAL | 0 refills | Status: DC | PRN

## 2016-08-14 NOTE — Telephone Encounter (Signed)
Notified pt that prescription is ready for pick up.

## 2016-08-14 NOTE — Telephone Encounter (Signed)
I contacted Jenny [name] with the results of the right arm CT from earlier today. She and her husband understand she is at risk for a pathologic fracture. Dr. Benay Spice recommends she wear an arm sling and avoid any lifting with that arm. We are making a referral to radiation oncology.

## 2016-08-14 NOTE — Telephone Encounter (Signed)
Patient requesting a refill of her oxycodone 5 mg. Patient will be out of medication of Saturday. Please call when ready for pick up.

## 2016-08-17 ENCOUNTER — Ambulatory Visit (HOSPITAL_BASED_OUTPATIENT_CLINIC_OR_DEPARTMENT_OTHER): Payer: BLUE CROSS/BLUE SHIELD | Admitting: Nurse Practitioner

## 2016-08-17 VITALS — BP 85/63 | HR 142 | Temp 98.5°F | Resp 16 | Wt 92.8 lb

## 2016-08-17 DIAGNOSIS — D630 Anemia in neoplastic disease: Secondary | ICD-10-CM | POA: Diagnosis not present

## 2016-08-17 DIAGNOSIS — C3490 Malignant neoplasm of unspecified part of unspecified bronchus or lung: Secondary | ICD-10-CM | POA: Diagnosis not present

## 2016-08-17 DIAGNOSIS — R110 Nausea: Secondary | ICD-10-CM

## 2016-08-17 DIAGNOSIS — G893 Neoplasm related pain (acute) (chronic): Secondary | ICD-10-CM

## 2016-08-17 DIAGNOSIS — C7951 Secondary malignant neoplasm of bone: Secondary | ICD-10-CM | POA: Diagnosis not present

## 2016-08-17 DIAGNOSIS — D6481 Anemia due to antineoplastic chemotherapy: Secondary | ICD-10-CM

## 2016-08-17 DIAGNOSIS — R634 Abnormal weight loss: Secondary | ICD-10-CM

## 2016-08-17 NOTE — Progress Notes (Addendum)
[place] OFFICE PROGRESS NOTE   Diagnosis:  Small cell lung cancer  INTERVAL HISTORY:   Jenny [name] returns as scheduled. She completed cycle 3 irinotecan/cisplatin 08/10/2016. She has persistent low-grade nausea. Oral intake is poor. She has had diarrhea for the past 3 days. She is controlling the diarrhea with Imodium. She notes improvement in arm pain since wearing the sling.  Objective:  Vital signs in last 24 hours:  Blood pressure (!) 85/63, pulse (!) 142, temperature 98.5 F (36.9 C), temperature source Oral, resp. rate 16, weight 92 lb 12.8 oz (42.1 kg), SpO2 100 %.    HEENT: No thrush or ulcers. Resp: Breath sounds diminished right lower lung field. No respiratory distress. Cardio: Regular, tachycardic. GI: No hepatomegaly. Vascular: No leg edema. Neuro: Alert and oriented.  Musculoskeletal: Masslike fullness at the right posterior chest wall. Multiple scalp masses.    Lab Results:  Lab Results  Component Value Date   WBC 9.7 08/10/2016   HGB 9.5 (L) 08/10/2016   HCT 29.3 (L) 08/10/2016   MCV 85.2 08/10/2016   PLT 228 08/10/2016   NEUTROABS 7.5 (H) 08/10/2016    Imaging:  No results found.  Medications: I have reviewed the patient's current medications.  Assessment/Plan: 1.Limited stage small cell lung cancer, diagnosed on biopsy of a subcarinal lymph node and left lung mass by EBUSon 09/12/2015  PET scan 09/03/2015 confirmed a hypermetabolic left perihilar mass, 2 left lower lobe pulmonary masses, and mediastinal lymphadenopathy, no evidence of distant metastatic disease  Brain MRI negative for metastatic disease 09/13/2015  Status post chest radiation 09/26/2015 through 10/18/2015  Cycle 1 cisplatin/etoposide beginning 09/26/2015  Cycle 2 cisplatin/etoposide beginning 10/16/2015  Cycle 3 carboplatin/etoposide beginning 11/06/2015  12/17/2015 MRI of the brain negative  12/17/2015 CT scans chest/abdomen/pelvis (report currently  not available)  Bone scan 12/27/2015 with minimal increased uptake in the posterior aspect of the right 11th rib that does not appear abnormal on the CT images of August 2017; sclerotic focus noted on the previous CT scan in the body of L1 showed no abnormal uptake on the current bone scan; asymmetric increased uptake in the left SI joint region possibly corresponding to a subtle sclerotic focus in the medial posterior aspect of the left iliac bone  CT chest 04/07/2016-new liver metastases, new upper abdominal adenopathy, left iliac metastasis, new right posterior lateral Pleural/chest wall metastasis  Cycle 1 salvage chemotherapy with etoposide/carboplatin01/12/2016  Cycle 2 salvage chemotherapy with etoposide/carboplatin 06/09/2016-Neulasta added  Restaging CTs of the head, chest, abdomen, and pelvis on 07/06/2016-progressive skull metastases with intracranial/extracranial extension, stable lung nodules, progressive disease in the liver, bones, enlargement of a right chest wall mass, and enlarging peritoneal nodule  Cycle 1 irinotecan/cisplatin 07/13/2016  Cycle 2 irinotecan/cisplatin 07/27/2016  Cycle 3 irinotecan/cisplatin 08/10/2016  CT right arm 08/14/2016-metastatic lesion proximal right humeral shaft with a moth-eaten appearance. At risk for pathologic fracture. Metastatic lesion in the proximal right radial shaft with apparent extension into adjacent soft tissues.  2. 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 Weight loss secondary to #2  4. History of febrile neutropenia following chemotherapy  5. Remote history of a right leg DVT-2008?-Maintained on Lovenox  6. History of tobacco use  7. Admission to [facility] 10/27/2015 with neutropenic fever, esophagitis, and dehydration  8. Hypercalcemia 05/01/2016-status post intravenous fluids and Zometa12/29/2017  9. Fever prior to hospital admission  05/17/2016-infectious versus tumor fever; treated for pneumonia, respiratory panel positive for coronavirus  10. Anemia secondary  to metastatic small cell carcinoma involving the bones, chronic disease, and chemotherapy  11. Altered mental status-potentially related to infection/delirium, concern for brain metastases; brain CT 05/19/2016-no brain metastases, multiple skull metastases; improved 05/28/2016, 06/22/2016.  12. History of Leukopenia/thrombocytopenia secondary to chemotherapy-Neulasta added with cycle 2 etoposide/carboplatin    Disposition: Jenny [name] has completed 3 cycles of irinotecan/cisplatin. At the time of her last visit she complained of pain at the right forearm and was felt to possibly have a right forearm mass. CT of the right arm showed metastatic lesions involving the proximal right humeral shaft and the proximal right radial shaft. The proximal right humeral shaft lesion was felt to be a risk for pathologic fracture. She has been referred to radiation oncology. She will continue to wear the right arm sling.  We are referring her for a restaging CT scan of the chest. Dr. Benay [name] discussed a supportive care approach with Jenny [name] and her family at today's visit if disease progression is confirmed.  She would like to return for a follow-up visit on 08/21/2016 to review the results of the scan. She will contact the office in the interim with any problems.  Patient seen with Dr. Benay [name].    Jenny, [name] ANP/GNP-BC   08/17/2016  2:56 PM This was a shared visit with Jenny [name]. Jenny [name] was interviewed and examined. We discussed the CT findings with her. I suspect this indicates disease progression despite irinotecan and cisplatin. She will undergo a restaging chest CT are to a return visit next week.  We refer her to radiation oncology for palliative radiation to the right arm. I think it would be difficult for her to undergo surgical stabilization.  Jenny [name], M.D.

## 2016-08-18 ENCOUNTER — Telehealth: Payer: Self-pay | Admitting: Oncology

## 2016-08-18 NOTE — Telephone Encounter (Signed)
Patient bypassed scheduling on 08/17/16. Appointments scheduled per 04/176/18 los. Called patient to confirm lab appointment for 08/20/16 anf follow up with Dr Benay Spice on 04/20 @ 8 a.m.

## 2016-08-19 NOTE — Progress Notes (Signed)
[place]         [phone] ________________________________  Initial outpatient Consultation  Name: Jenny [name] MRN: [id number]  Date: 08/20/2016  DOB: January 02, 1951  REFERRING PHYSICIAN: Ladell Pier, MD  DIAGNOSIS: 66 yo woman with limited stage small cell lung cancer at risk for brain metastases and with new bony metastasis in the right proximal humeral shaft and proximal right radial shaft with apparent extension into adjacent soft tissues.    ICD-9-CM ICD-10-CM   1. Small cell lung cancer ([place]) 162.9 C34.90     HISTORY OF PRESENT ILLNESS::Jenny [name] is a 66 y.o. female who presented to the emergency room on 08/19/2015 with chest pain, a cough, and hemoptysis. Chest x-ray revealed a left perihilar mass.   A CT of the chest confirmed a left hilar mass with mediastinal and subcarinal adenopathy. A possible left lower lobe intrapulmonary lymph node and a left lower lobe mass were seen.   PET scan 09/03/2015 confirmed a hypermetabolic 7.5 similar left perihilar mass, 2 hypermetabolic left lower lobe nodules, and hypermetabolic mediastinal lymphadenopathy. No abnormal activity in the liver, adrenal glands, or spleen. No skeletal metastases.  She was referred to pulmonology on 08/28/2015 and was scheduled for bronchoscopy on 09/09/2015. She reports feeling anxious about the 2 week wait and self-referred to the [facility] treatment center of [place] in [place].  She underwent a bronchoscopy on 09/12/2015 . On endobronchial ultrasound a subcarinal node was identified in biopsy. A left lower mass was also identified and biopsied via endobronchial ultrasound. The cytology from a subcarinal lymph node and left lower lobe mass revealed small cell carcinoma. Immunostains on the subcarinal lymph nodes were positive for TTF-1, synaptophysin and cytokeratin AE1/AE3. The chromogranin stain was negative. The Ki-67 returned at greater than 50%.  She reports being evaluated  by both medical oncology and radiation oncology. She decided to proceed with treatment in [place]. A brain MRI on 09/13/2015 revealed no evidence of metastatic disease.  She was treated with chest radiation and 3 cycles of etoposide/cisplatin chemotherapy.  Status post BID chest radiation 09/26/2015 through 10/18/2015   Cycle 1 cisplatin/etoposide beginning 09/26/2015  Cycle 2 cisplatin/etoposide beginning 10/16/2015  Cycle 3 carboplatin/etoposide beginning 11/06/2015 She completed 3 cycles of etoposide/cisplatin chemotherapy It appears the chemotherapy was dose reduced and cisplatin was changed to carboplatin with cycle 3. She developed a neutropenic fever following cycle 2.  She completed chemotherapy and radiation in early July 2017. She developed severe odynophagia and was admitted to [facility] on 11/11/2015. She was treated with intravenous hydration and analgesics. An upper endoscopy on 11/12/2015 confirmed demarcated circumferential esophagitis from 20-30 cm from the incisors. She was discharged on 11/12/2015.  She has undergone re-staging with imaging at [facility].  On, 12/17/2015 MRI of the brain negative, on 12/17/2015 CT scans chest/abdomen/pelvis showed improvement.  Bone scan 12/27/2015 with minimal increased uptake in the posterior aspect of the right 11th rib that does not appear abnormal on the CT images of August 2017; sclerotic focus noted on the previous CT scan in the body of L1 showed no abnormal uptake on the current bone scan; asymmetric increased uptake in the left SI joint region possibly corresponding to a subtle sclerotic focus in the medial posterior aspect of the left iliac bone  She was evaluated in our office with Dr. Tammi Klippel in 01/2016 to discuss possible prophylactic cranial irradiaiton. However, she did not follow up for treatment due to interval disease progression requiring continued systemic chemotherapy.  Re-staging CTchest  04/07/2016 demonstrated new liver metastases,  new upper abdominal adenopathy, left iliac metastasis, new right posterior lateral pleural/chest wall metastasis. She was treated with salvage chemotherapy.  Cycle 1 salvage chemotherapy with etoposide/carboplatin01/12/2016  Cycle 2 salvage chemotherapy with etoposide/carboplatin 06/09/2016-Neulasta added  Restaging CTs of the head, chest, abdomen, and pelvis on 07/06/2016 demonstrated progressive skull metastases with intracranial/extracranial extension, stable lung nodules, progressive disease in the liver, bones, enlargement of a right chest wall mass, and enlarging peritoneal nodule. Etoposide/carboplatin was discontinued due to disease progression despite tx. and patient completed 3 cycles of irinotecan/cisplatin.  Cycle 1 irinotecan/cisplatin 07/13/2016  Cycle 2 irinotecan/cisplatin 07/27/2016  Cycle 3 irinotecan/cisplatin 08/10/2016  On reent followup with Dr. Benay Spice 08/10/16 she reported the new onset of right forearm pain.  CT right arm 08/14/2016 for further evaluation demonstrated a metastatic lesion of the proximal right humeral shaft with a moth-eaten appearance felt to be at risk for pathologic fracture. Additionally, there was a metastatic lesion in the proximal right radial shaft with apparent extension into adjacent soft tissues.  She presents today for discussion of the possible role of palliative radiotherapy to the right arm metastases.  PREVIOUS RADIATION THERAPY: Yes Status post chest radiation 09/26/2015 through 10/18/2015 at [facility]  Past Medical History:  Diagnosis Date  . DVT (deep venous thrombosis) ([place])    1 year ago  . History of chemotherapy   . Hx of radiation therapy   . Hypercholesteremia   . Lung cancer [facility])   :   Past Surgical History:  Procedure Laterality Date  . CESAREAN SECTION    . ESOPHAGOGASTRODUODENOSCOPY (EGD) WITH PROPOFOL N/A 11/12/2015   Procedure: ESOPHAGOGASTRODUODENOSCOPY (EGD) WITH PROPOFOL;  Surgeon: Doran Stabler, MD;   Location: [facility] ENDOSCOPY;  Service: Endoscopy;  Laterality: N/A;  With propofol if available  :   Current Outpatient Prescriptions:  .  acetaminophen (TYLENOL) 500 MG tablet, Take 500 mg by mouth every 8 (eight) hours as needed., Disp: , Rfl:  .  ALPRAZolam (XANAX) 0.25 MG tablet, Take 1 tablet (0.25 mg total) by mouth daily as needed for anxiety or sleep., Disp: 30 tablet, Rfl: 0 .  DULoxetine (CYMBALTA) 30 MG capsule, Take 30 mg by mouth daily., Disp: , Rfl: 1 .  enoxaparin (LOVENOX) 80 MG/0.8ML injection, Inject 0.65 mLs (65 mg total) into the skin daily., Disp: 30 Syringe, Rfl: 5 .  feeding supplement, ENSURE ENLIVE, (ENSURE ENLIVE) LIQD, Take 237 mLs by mouth 2 (two) times daily between meals., Disp: 237 mL, Rfl: 12 .  guaiFENesin (MUCINEX) 600 MG 12 hr tablet, Take 1 tablet (600 mg total) by mouth 2 (two) times daily. (Patient not taking: Reported on 07/07/2016), Disp: 10 tablet, Rfl: 0 .  loperamide (IMODIUM) 2 MG capsule, Take 2 mg by mouth as needed for diarrhea or loose stools., Disp: , Rfl:  .  Multiple Vitamin (MULTIVITAMIN WITH MINERALS) TABS tablet, Take 1 tablet by mouth daily., Disp: 30 tablet, Rfl: 0 .  ondansetron (ZOFRAN-ODT) 8 MG disintegrating tablet, Take 1 tablet (8 mg total) by mouth every 8 (eight) hours as needed for nausea or vomiting., Disp: 20 tablet, Rfl: 1 .  oxyCODONE (OXY IR/ROXICODONE) 5 MG immediate release tablet, Take 2-3 tablets (10-15 mg total) by mouth every 4 (four) hours as needed for severe pain., Disp: 100 tablet, Rfl: 0 .  oxyCODONE (OXYCONTIN) 20 mg 12 hr tablet, Take 1 tablet (20 mg total) by mouth every 12 (twelve) hours., Disp: 60 tablet, Rfl: 0 .  polyethylene glycol (MIRALAX) packet,  Take 17 g by mouth daily as needed for mild constipation. (Patient not taking: Reported on 07/27/2016), Disp: 14 each, Rfl: 0 .  prochlorperazine (COMPAZINE) 10 MG tablet, Take 1 tablet (10 mg total) by mouth every 6 (six) hours as needed for nausea or vomiting. (Patient not  taking: Reported on 07/27/2016), Disp: 20 tablet, Rfl: 2 .  senna-docusate (SENOKOT-S) 8.6-50 MG tablet, Take 1 tablet by mouth 2 (two) times daily. (Patient not taking: Reported on 07/27/2016), Disp: 30 tablet, Rfl: 0  Current Facility-Administered Medications:  .  dexamethasone (DECADRON) injection 10 mg, 10 mg, Intravenous, Once, Ladell Pier, MD:   Allergies  Allergen Reactions  . Fentanyl     Patient prefers to never take this medication again. She doesn't like the way it makes her feel.   . Morphine And Related Itching and Rash  :   Family History  Problem Relation Age of Onset  . Stroke Mother   . Hypertension Mother   . Cancer Mother     head and neck cancer and non hodgkin's lymphoma  . Cancer Father     colon/rectal and squamous cell carcinoma skin  . Cancer Paternal Uncle     lung  . Cancer Paternal Grandfather     lymphoma  . Cancer Maternal Aunt     colon cancer  . Cancer Maternal Grandfather     mesothelioma    : Social History   Social History  . Marital status: Married    Spouse name: N/A  . Number of children: N/A  . Years of education: N/A   Occupational History  . Not on file.   Social History Main Topics  . Smoking status: Former Smoker    Packs/day: 1.00    Years: 39.00    Types: Cigarettes    Quit date: 08/20/2015  . Smokeless tobacco: Never Used  . Alcohol use No  . Drug use: Yes    Types: Benzodiazepines  . Sexual activity: Yes    Birth control/ protection: None   Other Topics Concern  . Not on file   Social History Narrative  . No narrative on file     :  REVIEW OF SYSTEMS:  On review of systems, the patient reports that she is doing well overall. She denies any chest pain, shortness of breath, cough, fevers, chills, or night sweats.  She does have diffuse diarrhea which she attributes to chemotherapy. She has tried Imodium with only minimal benefit.  Dr. Benay Spice has sent a new Rx for Lomotil which she will begin taking  today.  She denies any bladder disturbances, and denies abdominal pain, nausea or vomiting. She denies any new musculoskeletal or joint aches or pains aside from the right forearm, no new skin lesions or concerns. She denies numbness or tingling in the RUE and has not noted any decrease in strength or ROM of the RUE.  A complete review of systems is obtained and is otherwise negative.   PHYSICAL EXAM:  In general this is a well appearing caucasian female in no acute distress. She's alert and oriented x4 and appropriate throughout the examination. Cardiopulmonary assessment is negative for acute distress and she exhibits normal effort. Lungs are clear to auscultation bilaterally.  Cardiac exam exhibits tachycardia with normal rhythm and no M/R/G. She has full ROM of the right UE.  Sensation is intact and equal bilaterally in the UEs.  KPS = 90  100 - Normal; no complaints; no evidence of disease. 90   -  Able to carry on normal activity; minor signs or symptoms of disease. 80   - Normal activity with effort; some signs or symptoms of disease. 20   - Cares for self; unable to carry on normal activity or to do active work. 60   - Requires occasional assistance, but is able to care for most of his personal needs. 50   - Requires considerable assistance and frequent medical care. 35   - Disabled; requires special care and assistance. 70   - Severely disabled; hospital admission is indicated although death not imminent. 47   - Very sick; hospital admission necessary; active supportive treatment necessary. 10   - Moribund; fatal processes progressing rapidly. 0     - Dead  Karnofsky DA, [place], Craver LS and Burchenal [facility] [phone]) The use of the nitrogen mustards in the palliative treatment of carcinoma: with particular reference to bronchogenic carcinoma Cancer 1 634-56  LABORATORY DATA:  Lab Results  Component Value Date   WBC 9.7 08/10/2016   HGB 9.5 (L) 08/10/2016   HCT 29.3 (L) 08/10/2016    MCV 85.2 08/10/2016   PLT 228 08/10/2016   Lab Results  Component Value Date   NA 134 (L) 08/10/2016   K 4.4 08/10/2016   CL 106 05/20/2016   CO2 22 08/10/2016   Lab Results  Component Value Date   ALT 8 08/10/2016   AST 15 08/10/2016   ALKPHOS 143 08/10/2016   BILITOT 0.38 08/10/2016     RADIOGRAPHY: Ct Extrem Up Entire Arm R Wo/cm  Result Date: 08/14/2016 CLINICAL DATA:  Pain and swelling of the right forearm. History metastatic lung cancer. EXAM: CT OF THE UPPER RIGHT EXTREMITY WITHOUT CONTRAST TECHNIQUE: Multidetector CT imaging of the upper right extremity was performed according to the standard protocol. COMPARISON:  None. FINDINGS: Bones/Joint/Cartilage There is a moth-eaten appearance of the proximal right humeral shaft and of the proximal right radial shaft consistent with metastatic disease. There is cortical destruction of both sites with findings suggestive of soft tissue mass around the radial lesion. Distal radius and ulna appear normal. Bones of the hand appear normal. There are multiple subtle blastic lesions in the pelvic bones and in the right femoral head. There are multiple old right rib fractures. IMPRESSION: 1. Metastatic lesion in the proximal right humeral shaft with a moth-eaten appearance. The patient is at risk for pathologic fracture at that site. 2. Metastatic lesion in the proximal right radial shaft. There is apparent extension into the adjacent soft tissues. Electronically Signed   By: Lorriane Shire M.D.   On: 08/14/2016 10:32      IMPRESSION/PLAN: 66 yo woman with history of limited stage small cell lung cancer, now with painful bony metastases in the right upper extremity.  Today, Dr. Tammi Klippel and I met with the patient and her husband to discuss the findings and work-up thus far.  We personally reviewed her recent CT imaging of the RUE.  We discussed the natural history of metastatic small cell lung cancer and general treatment, highlighting the role of  palliative radiotherapy  in the management.  We discussed the available radiation techniques, and focused on the details of logistics and delivery.  We anticipate a single fraction of treatment to both lesions in the RUE.  We reviewed the anticipated acute and late sequelae associated with radiation in this setting.  The patient was encouraged to ask questions that were answered to the best of our ability.  The patient would like to proceed  with palliative radiation and is scheduled for CT simulation on Friday, April 20th, 2018 at [place], PA-C And   Tyler Pita, MD [place] Director and Director of Stereotactic Radiosurgery Direct Dial: [phone]  Fax: [phone] [email or web address]  Skype  [email or web address]

## 2016-08-19 NOTE — Progress Notes (Signed)
Jenny [name]. Jenny [name] 66 y.o. woman with small cell lung cancer with pain in the right arm  and for cranial evaluation FUN.  Thoracic Location of Tumor / Histology: small cell lung left cancer    Patient presented 08/19/15 with symptoms of: chest pain, cough and hemoptysis  Biopsies of subcarinal lymph node and left lower lobe mass (if applicable) revealed: small cell lung carcinoma, left  Tobacco/Marijuana/Snuff/ETOH use: quit 08/26/2015  Past/Anticipated interventions by cardiothoracic surgery, if any: No but, an endobronchial ultrasound a subcarinal node biopsy 09/12/15  Past/Anticipated interventions by medical oncology, if any: 3 cycles of etoposide/cisplatin at [facility] in [place]  Signs/Symptoms Weight changes, if any:. Wt Readings from Last 3 Encounters:  08/20/16 90 lb (40.8 kg)  08/17/16 92 lb 12.8 oz (42.1 kg)  08/10/16 95 lb 6.4 oz (43.3 kg)      2 lb weight loss   ,yes following chemotherapy and radiation secondary to odynophagia  Respiratory complaints, if any: patient denies a cough but, often has to clear her throat because there is still lingering esophagitis. Reports so long as she cuts her breads and meats into smaller portions she has not difficulty swallowing. Denies SOB with exertion even.   Hemoptysis, if any: NO  Pain issues, if any:  NO  SAFETY ISSUES:  Prior radiation? yes  Pacemaker/ICD? no   Possible current pregnancy?no  Is the patient on methotrexate? no  Current Complaints / other details:  66 year old female. Having diarrhea stools one to two a day for the past two weeks taking imodium  Evaluation for prophylactic cranial radiation. BP (!) 76/58   Pulse (!) 122   Temp 97.7 F (36.5 C) (Oral)   Resp 16   Ht (!) 5" (0.127 m)   Wt 90 lb (40.8 kg)   SpO2 95%   BMI 2531.08 kg/m Left arm sitting BP (!) 70/51   Pulse (!) 140   Temp 97.7 F (36.5 C) (Oral)   Resp 16   Ht (!) 5" (0.127 m)   Wt 90 lb (40.8  kg)   SpO2 95%   BMI 2531.08 kg/m

## 2016-08-20 ENCOUNTER — Encounter: Payer: Self-pay | Admitting: Urology

## 2016-08-20 ENCOUNTER — Other Ambulatory Visit (HOSPITAL_BASED_OUTPATIENT_CLINIC_OR_DEPARTMENT_OTHER): Payer: BLUE CROSS/BLUE SHIELD

## 2016-08-20 ENCOUNTER — Ambulatory Visit
Admission: RE | Admit: 2016-08-20 | Discharge: 2016-08-20 | Disposition: A | Discharge status: Home / Self Care | Payer: BLUE CROSS/BLUE SHIELD | Source: Ambulatory Visit | Attending: Urology | Admitting: Urology

## 2016-08-20 ENCOUNTER — Telehealth: Payer: Self-pay | Admitting: *Deleted

## 2016-08-20 ENCOUNTER — Ambulatory Visit (HOSPITAL_COMMUNITY)
Admission: RE | Admit: 2016-08-20 | Discharge: 2016-08-20 | Disposition: A | Discharge status: Home / Self Care | Payer: BLUE CROSS/BLUE SHIELD | Source: Ambulatory Visit | Attending: Nurse Practitioner | Admitting: Nurse Practitioner

## 2016-08-20 ENCOUNTER — Ambulatory Visit
Admission: RE | Admit: 2016-08-20 | Discharge: 2016-08-20 | Disposition: A | Discharge status: Home / Self Care | Payer: BLUE CROSS/BLUE SHIELD | Source: Ambulatory Visit | Attending: Radiation Oncology | Admitting: Radiation Oncology

## 2016-08-20 ENCOUNTER — Other Ambulatory Visit: Payer: Self-pay | Admitting: *Deleted

## 2016-08-20 VITALS — BP 70/51 | HR 140 | Temp 97.7°F | Resp 16 | Ht <= 58 in | Wt 90.0 lb

## 2016-08-20 DIAGNOSIS — Z510 Encounter for antineoplastic radiation therapy: Secondary | ICD-10-CM | POA: Diagnosis not present

## 2016-08-20 DIAGNOSIS — C3490 Malignant neoplasm of unspecified part of unspecified bronchus or lung: Secondary | ICD-10-CM | POA: Diagnosis not present

## 2016-08-20 DIAGNOSIS — C787 Secondary malignant neoplasm of liver and intrahepatic bile duct: Secondary | ICD-10-CM | POA: Insufficient documentation

## 2016-08-20 DIAGNOSIS — Z9221 Personal history of antineoplastic chemotherapy: Secondary | ICD-10-CM | POA: Insufficient documentation

## 2016-08-20 LAB — BASIC METABOLIC PANEL
ANION GAP: 13 meq/L — AB (ref 3–11)
BUN: 12.9 mg/dL (ref 7.0–26.0)
CALCIUM: 9.4 mg/dL (ref 8.4–10.4)
CHLORIDE: 101 meq/L (ref 98–109)
CO2: 21 mEq/L — ABNORMAL LOW (ref 22–29)
CREATININE: 0.8 mg/dL (ref 0.6–1.1)
EGFR: 74 mL/min/{1.73_m2} — ABNORMAL LOW (ref >90)
Glucose: 110 mg/dl (ref 70–140)
Potassium: 4.4 mEq/L (ref 3.5–5.1)
SODIUM: 135 meq/L — AB (ref 136–145)

## 2016-08-20 MED ORDER — IOPAMIDOL (ISOVUE-300) INJECTION 61%
INTRAVENOUS | Status: AC
  Filled 2016-08-20: qty 75

## 2016-08-20 MED ORDER — IOPAMIDOL (ISOVUE-300) INJECTION 61%
75.0000 mL | Freq: Once | INTRAVENOUS | Status: AC | PRN
Start: 2016-08-20 — End: 2016-08-20
  Administered 2016-08-20: 60 mL via INTRAVENOUS

## 2016-08-20 MED ORDER — DIPHENOXYLATE-ATROPINE 2.5-0.025 MG PO TABS: 2.0000 | ORAL_TABLET | Freq: Four times a day (QID) | ORAL | 0 refills | Status: AC | PRN

## 2016-08-20 NOTE — Telephone Encounter (Signed)
CT Scan had to be reordered for patient have completed today. Orders incomplete. Patient scheduled for now. She is aware of appointment.

## 2016-08-20 NOTE — Telephone Encounter (Signed)
Patient calling to request medication for diarrhea, states she had 3 episodes yesterday and 1 this morning. Per dr Benay Spice , lomotil called in. Patient notified.

## 2016-08-21 ENCOUNTER — Other Ambulatory Visit: Payer: Self-pay | Admitting: *Deleted

## 2016-08-21 ENCOUNTER — Ambulatory Visit
Admission: RE | Admit: 2016-08-21 | Discharge: 2016-08-21 | Disposition: A | Discharge status: Home / Self Care | Payer: BLUE CROSS/BLUE SHIELD | Source: Ambulatory Visit | Attending: Radiation Oncology | Admitting: Radiation Oncology

## 2016-08-21 ENCOUNTER — Ambulatory Visit (HOSPITAL_BASED_OUTPATIENT_CLINIC_OR_DEPARTMENT_OTHER): Payer: BLUE CROSS/BLUE SHIELD | Admitting: Oncology

## 2016-08-21 ENCOUNTER — Telehealth: Payer: Self-pay | Admitting: *Deleted

## 2016-08-21 ENCOUNTER — Telehealth: Payer: Self-pay | Admitting: Oncology

## 2016-08-21 VITALS — BP 83/58 | HR 135 | Temp 97.8°F | Resp 18 | Ht 60.0 in | Wt 90.6 lb

## 2016-08-21 DIAGNOSIS — R197 Diarrhea, unspecified: Secondary | ICD-10-CM

## 2016-08-21 DIAGNOSIS — C3490 Malignant neoplasm of unspecified part of unspecified bronchus or lung: Secondary | ICD-10-CM

## 2016-08-21 DIAGNOSIS — C3492 Malignant neoplasm of unspecified part of left bronchus or lung: Secondary | ICD-10-CM | POA: Diagnosis not present

## 2016-08-21 DIAGNOSIS — Z510 Encounter for antineoplastic radiation therapy: Secondary | ICD-10-CM | POA: Diagnosis not present

## 2016-08-21 DIAGNOSIS — R634 Abnormal weight loss: Secondary | ICD-10-CM

## 2016-08-21 DIAGNOSIS — C7951 Secondary malignant neoplasm of bone: Secondary | ICD-10-CM

## 2016-08-21 MED ORDER — ONDANSETRON 8 MG PO TBDP: 8.0000 mg | ORAL_TABLET | Freq: Three times a day (TID) | ORAL | 1 refills | Status: AC | PRN

## 2016-08-21 MED ORDER — OXYCODONE HCL 5 MG PO TABS: 10.0000 mg | ORAL_TABLET | ORAL | 0 refills | Status: DC | PRN

## 2016-08-21 NOTE — Telephone Encounter (Signed)
Messages was sent to [employer], [employer]. CC Dr Benay Spice. Awaiting response per Chemo scheduling.

## 2016-08-21 NOTE — Progress Notes (Signed)
[place] OFFICE PROGRESS NOTE   Diagnosis: Small cell lung cancer  INTERVAL HISTORY:   Ms. Smead returns as scheduled. She reports the right arm pain has resolved. The swelling has also resolved. No new complaint. Her energy has improved. She has a consistent high heart rate, but she does not feel this. Her overall pain is improved. She saw Dr. Tammi Klippel scheduled for radiation simulation today. Dr. Tammi Klippel plans to deliver a single fraction of radiation to the right humerus and right forearm. She has developed diarrhea 2-3 times per day. Imodium did not help. She was prescribed Lomotil yesterday and has not yet tried this.  Objective:  Vital signs in last 24 hours:  Blood pressure (!) 83/58, pulse (!) 135, temperature 97.8 F (36.6 C), temperature source Oral, resp. rate 18, height 5' (1.524 m), weight 90 lb 9.6 oz (41.1 kg), SpO2 100 %.    HEENT: Stable scalp masses, no thrush Resp: Decreased breath sounds at the right compared to the left chest, no respiratory distress Cardio: Tachycardia, regular rate and rhythm GI: No hepatosplenomegaly, nontender Vascular: No leg edema, diminished skin turgor Musculoskeletal: Palpable mass at the right forearm, no pain with motion at the right arm   Lab Results:  Lab Results  Component Value Date   WBC 9.7 08/10/2016   HGB 9.5 (L) 08/10/2016   HCT 29.3 (L) 08/10/2016   MCV 85.2 08/10/2016   PLT 228 08/10/2016   NEUTROABS 7.5 (H) 08/10/2016   08/20/2016: potassium 4.4, BUN 12.9, creatinine 0.8  Imaging:  Ct Chest W Contrast  Result Date: 08/20/2016 CLINICAL DATA:  Followup metastatic small cell lung cancer. EXAM: CT CHEST WITH CONTRAST TECHNIQUE: Multidetector CT imaging of the chest was performed during intravenous contrast administration. CONTRAST:  66m ISOVUE-300 IOPAMIDOL (ISOVUE-300) INJECTION 61% COMPARISON:  07/06/2016 FINDINGS: Cardiovascular: Normal heart size. Aortic atherosclerosis noted. No pericardial  effusion identified. Calcification within the LAD coronary artery noted. Mediastinum/Nodes: The trachea appears patent and is midline. Normal appearance of the esophagus. The index mediastinal lymph node posterior to the left atrium and ventral to the descending thoracic aorta measures 2 cm, image 77 of series 2. Previously 1.4 cm. Lungs/Pleura: There is a small left pleural effusion. Paramediastinal consolidation, fibrosis and architectural distortion identified within the left lung compatible with changes of external beam radiation. Pulmonary nodule within the lingula measures 0.8 x 1.1 cm, image 95 of series 5. Previously 0.9 x 1.5 cm. Multiple other nodules in the lungs appears similar to previous exam. Upper Abdomen: Multiple liver metastases are again identified. The index lesion within the lateral segment of left lobe of liver measures 4.3 x 4.5 cm, image 167 of series 2. Previously 4.5 x 4.4 cm. Index lesion within medial segment of left lobe of liver measures 3.4 x 4.8 cm, image 176 of series 2. Previously 2.9 x 4.3 cm. Posterior right lobe of liver lesion measures 2.3 x 2.0 cm, image 185 of series 2. Previously 2.2 x 1.8 cm. Musculoskeletal: Right posterior chest wall mass measures encasing the right eleventh rib 4.6 x 2.7 cm, image 138 of series 2. Previously 5.5 x 3.5 cm. Mixed lytic and sclerotic bone lesions are again identified and overall appears stable when compared with the previous exam. IMPRESSION: 1. There is been a mixed interval response to therapy. 2. Index lingular nodule is slightly decreased in size in the interval. The right posterior chest wall lesion has also decreased in size in the interval. Dominant liver metastasis in the lateral segment of  left lobe of liver is not significantly changed in the interval. 3. Index mediastinal lymph node has increased in size in the interval. There is also a lesion within the medial segment of left lobe of liver which has increased in the interval.  Posterior right lobe of liver lesion is also mildly increased in the interval. Electronically Signed   By: Kerby Moors M.D.   On: 08/20/2016 11:45    Medications: I have reviewed the patient's current medications.  Assessment/Plan: 1.Limited stage small cell lung cancer, diagnosed on biopsy of a subcarinal lymph node and left lung mass by EBUSon 09/12/2015  PET scan 09/03/2015 confirmed a hypermetabolic left perihilar mass, 2 left lower lobe pulmonary masses, and mediastinal lymphadenopathy, no evidence of distant metastatic disease  Brain MRI negative for metastatic disease 09/13/2015  Status post chest radiation 09/26/2015 through 10/18/2015  Cycle 1 cisplatin/etoposide beginning 09/26/2015  Cycle 2 cisplatin/etoposide beginning 10/16/2015  Cycle 3 carboplatin/etoposide beginning 11/06/2015  12/17/2015 MRI of the brain negative  12/17/2015 CT scans chest/abdomen/pelvis (report currently not available)  Bone scan 12/27/2015 with minimal increased uptake in the posterior aspect of the right 11th rib that does not appear abnormal on the CT images of August 2017; sclerotic focus noted on the previous CT scan in the body of L1 showed no abnormal uptake on the current bone scan; asymmetric increased uptake in the left SI joint region possibly corresponding to a subtle sclerotic focus in the medial posterior aspect of the left iliac bone  CT chest 04/07/2016-new liver metastases, new upper abdominal adenopathy, left iliac metastasis, new right posterior lateral Pleural/chest wall metastasis  Cycle 1 salvage chemotherapy with etoposide/carboplatin01/12/2016  Cycle 2 salvage chemotherapy with etoposide/carboplatin 06/09/2016-Neulasta added  Restaging CTs of the head, chest, abdomen, and pelvis on 07/06/2016-progressive skull metastases with intracranial/extracranial extension, stable lung nodules, progressive disease in the liver, bones, enlargement of a right chest wall mass, and  enlarging peritoneal nodule  Cycle 1 irinotecan/cisplatin 07/13/2016  Cycle 2 irinotecan/cisplatin 07/27/2016  Cycle 3 irinotecan/cisplatin 08/10/2016  CT right arm 08/14/2016-metastatic lesion proximal right humeral shaft with a moth-eaten appearance. At risk for pathologic fracture. Metastatic lesion in the proximal right radial shaft with apparent extension into adjacent soft tissues.  CT chest 08/20/2016-overall stable disease, mixed response with a decrease in the right chest wall mass and a lingular nodule, slight increase in several of the liver lesions  2. 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 Weight loss secondary to #2  4. History of febrile neutropenia following chemotherapy  5. Remote history of a right leg DVT-2008?-Maintained on Lovenox  6. History of tobacco use  7. Admission to [facility] 10/27/2015 with neutropenic fever, esophagitis, and dehydration  8. Hypercalcemia 05/01/2016-status post intravenous fluids and Zometa12/29/2017  9. Fever prior to hospital admission 05/17/2016-infectious versus tumor fever; treated for pneumonia, respiratory panel positive for coronavirus  10. 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 Altered mental status-potentially related to infection/delirium, concern for brain metastases; brain CT 05/19/2016-no brain metastases, multiple skull metastases; improved 05/28/2016, 06/22/2016.  12. History of Leukopenia/thrombocytopenia secondary to chemotherapy-Neulasta added with cycle 2 etoposide/carboplatin  13. Tachycardia/hypotension-persistent, likely secondary to dehydration   Disposition:  Her overall performance status has improved since beginning irinotecan/cisplatin. She will continue this regimen. The restaging CT shows overall stable disease. She will complete palliative  radiation to the right arm lesions. There has been significant provement in the right arm pain over  the past few days. She will receive intravenous fluids and follow-up of the blood pressure and pulse in the office today.  Jenny [name] will be scheduled for irinotecan/cisplatin next week. She will return for an office visit and the next cycle of chemotherapy 2 weeks later.  25 minutes were spent with the patient today. The majority of the time was used for counseling and coordination of care.  Betsy Coder, MD  08/21/2016  8:21 AM

## 2016-08-21 NOTE — Progress Notes (Signed)
Patient refusing to stay for IV fluids per order of Dr. Benay Spice after CT simulation.  Repeat BP-74/53 and HR-126.  Patient states that she "feels fine" and will call us this afternoon if she changes her mind regarding receiving IV fluids.  She denies any dizziness or lightheadedness.  Dr. Benay Spice notified.

## 2016-08-21 NOTE — Telephone Encounter (Signed)
Spoke with patient re IVF appointment 4/23 @ 9 am. Fluids scheduled per 4/20 schedule message.

## 2016-08-21 NOTE — Telephone Encounter (Signed)
Call from pt: "I need to let Jenny [name] know that today is not a good day for me to come in and get IV fluids. Can we schedule it for Monday? I will drink as much as I can all weekend." Message forwarded to Dr. Benay Spice and RN.

## 2016-08-23 NOTE — Progress Notes (Signed)
  Radiation Oncology         [phone]) [phone] ________________________________  Name: Jenny [name] MRN: [id number]  Date: 08/21/2016  DOB: [birth year]  SIMULATION AND TREATMENT PLANNING NOTE    ICD-9-CM ICD-10-CM   1. Small cell lung cancer (HCC) 162.9 C34.90     DIAGNOSIS:  66 yo woman with painful right upper extremity painful skleletal metastases from small cell carcinoma of the left lower lobe of the lung  NARRATIVE:  The patient was brought to the [facility].  Identity was confirmed.  All relevant records and images related to the planned course of therapy were reviewed.  The patient freely provided informed written consent to proceed with treatment after reviewing the details related to the planned course of therapy. The consent form was witnessed and verified by the simulation staff.  Then, the patient was set-up in a stable reproducible  supine position for radiation therapy.  CT images were obtained.  Surface markings were placed.  The CT images were loaded into the planning software.  Then the target and avoidance structures were contoured.  Treatment planning then occurred.  The radiation prescription was entered and confirmed.  Then, I designed and supervised the construction of a total of 5 medically necessary complex treatment devices with BodyFix and 4 MLCs.  I have requested : Isodose Plan.   PLAN:  The patient will receive 8 Gy in 1 fraction.  ________________________________  Sheral Apley Tammi Klippel, M.D.

## 2016-08-24 ENCOUNTER — Ambulatory Visit (HOSPITAL_BASED_OUTPATIENT_CLINIC_OR_DEPARTMENT_OTHER): Payer: BLUE CROSS/BLUE SHIELD

## 2016-08-24 DIAGNOSIS — C3490 Malignant neoplasm of unspecified part of unspecified bronchus or lung: Secondary | ICD-10-CM

## 2016-08-24 DIAGNOSIS — Z510 Encounter for antineoplastic radiation therapy: Secondary | ICD-10-CM | POA: Diagnosis not present

## 2016-08-24 MED ORDER — SODIUM CHLORIDE 0.9 % IV SOLN
Freq: Once | INTRAVENOUS | Status: AC
  Administered 2016-08-24: 10:00:00 via INTRAVENOUS

## 2016-08-24 NOTE — Progress Notes (Signed)
RN visit for IV fluids. 

## 2016-08-24 NOTE — Patient Instructions (Signed)
Dehydration, Adult Dehydration is a condition in which there is not enough fluid or water in the body. This happens when you lose more fluids than you take in. Important organs, such as the kidneys, brain, and heart, cannot function without a proper amount of fluids. Any loss of fluids from the body can lead to dehydration. Dehydration can range from mild to severe. This condition should be treated right away to prevent it from becoming severe. What are the causes? This condition may be caused by:  Vomiting.  Diarrhea.  Excessive sweating, such as from heat exposure or exercise.  Not drinking enough fluid, especially:  When ill.  While doing activity that requires a lot of energy.  Excessive urination.  Fever.  Infection.  Certain medicines, such as medicines that cause the body to lose excess fluid (diuretics).  Inability to access safe drinking water.  Reduced physical ability to get adequate water and food. What increases the risk? This condition is more likely to develop in people:  Who have a poorly controlled long-term (chronic) illness, such as diabetes, heart disease, or kidney disease.  Who are age 65 or older.  Who are disabled.  Who live in a place with high altitude.  Who play endurance sports. What are the signs or symptoms? Symptoms of mild dehydration may include:   Thirst.  Dry lips.  Slightly dry mouth.  Dry, warm skin.  Dizziness. Symptoms of moderate dehydration may include:   Very dry mouth.  Muscle cramps.  Dark urine. Urine may be the color of tea.  Decreased urine production.  Decreased tear production.  Heartbeat that is irregular or faster than normal (palpitations).  Headache.  Light-headedness, especially when you stand up from a sitting position.  Fainting (syncope). Symptoms of severe dehydration may include:   Changes in skin, such as:  Cold and clammy skin.  Blotchy (mottled) or pale skin.  Skin that does  not quickly return to normal after being lightly pinched and released (poor skin turgor).  Changes in body fluids, such as:  Extreme thirst.  No tear production.  Inability to sweat when body temperature is high, such as in hot weather.  Very little urine production.  Changes in vital signs, such as:  Weak pulse.  Pulse that is more than 100 beats a minute when sitting still.  Rapid breathing.  Low blood pressure.  Other changes, such as:  Sunken eyes.  Cold hands and feet.  Confusion.  Lack of energy (lethargy).  Difficulty waking up from sleep.  Short-term weight loss.  Unconsciousness. How is this diagnosed? This condition is diagnosed based on your symptoms and a physical exam. Blood and urine tests may be done to help confirm the diagnosis. How is this treated? Treatment for this condition depends on the severity. Mild or moderate dehydration can often be treated at home. Treatment should be started right away. Do not wait until dehydration becomes severe. Severe dehydration is an emergency and it needs to be treated in a hospital. Treatment for mild dehydration may include:   Drinking more fluids.  Replacing salts and minerals in your blood (electrolytes) that you may have lost. Treatment for moderate dehydration may include:   Drinking an oral rehydration solution (ORS). This is a drink that helps you replace fluids and electrolytes (rehydrate). It can be found at pharmacies and retail stores. Treatment for severe dehydration may include:   Receiving fluids through an IV tube.  Receiving an electrolyte solution through a feeding tube that is   passed through your nose and into your stomach (nasogastric tube, or NG tube).  Correcting any abnormalities in electrolytes.  Treating the underlying cause of dehydration. Follow these instructions at home:  If directed by your health care provider, drink an ORS:  Make an ORS by following instructions on the  package.  Start by drinking small amounts, about  cup (120 mL) every 5-10 minutes.  Slowly increase how much you drink until you have taken the amount recommended by your health care provider.  Drink enough clear fluid to keep your urine clear or pale yellow. If you were told to drink an ORS, finish the ORS first, then start slowly drinking other clear fluids. Drink fluids such as:  Water. Do not drink only water. Doing that can lead to having too little salt (sodium) in the body (hyponatremia).  Ice chips.  Fruit juice that you have added water to (diluted fruit juice).  Low-calorie sports drinks.  Avoid:  Alcohol.  Drinks that contain a lot of sugar. These include high-calorie sports drinks, fruit juice that is not diluted, and soda.  Caffeine.  Foods that are greasy or contain a lot of fat or sugar.  Take over-the-counter and prescription medicines only as told by your health care provider.  Do not take sodium tablets. This can lead to having too much sodium in the body (hypernatremia).  Eat foods that contain a healthy balance of electrolytes, such as bananas, oranges, potatoes, tomatoes, and spinach.  Keep all follow-up visits as told by your health care provider. This is important. Contact a health care provider if:  You have abdominal pain that:  Gets worse.  Stays in one area (localizes).  You have a rash.  You have a stiff neck.  You are more irritable than usual.  You are sleepier or more difficult to wake up than usual.  You feel weak or dizzy.  You feel very thirsty.  You have urinated only a small amount of very dark urine over 6-8 hours. Get help right away if:  You have symptoms of severe dehydration.  You cannot drink fluids without vomiting.  Your symptoms get worse with treatment.  You have a fever.  You have a severe headache.  You have vomiting or diarrhea that:  Gets worse.  Does not go away.  You have blood or green matter  (bile) in your vomit.  You have blood in your stool. This may cause stool to look black and tarry.  You have not urinated in 6-8 hours.  You faint.  Your heart rate while sitting still is over 100 beats a minute.  You have trouble breathing. This information is not intended to replace advice given to you by your health care provider. Make sure you discuss any questions you have with your health care provider. Document Released: 04/20/2005 Document Revised: 11/15/2015 Document Reviewed: 06/14/2015 Elsevier Interactive Patient Education  2017 [employer]  

## 2016-08-26 ENCOUNTER — Telehealth: Payer: Self-pay | Admitting: Oncology

## 2016-08-26 ENCOUNTER — Telehealth: Payer: Self-pay | Admitting: *Deleted

## 2016-08-26 ENCOUNTER — Ambulatory Visit
Admission: RE | Admit: 2016-08-26 | Discharge: 2016-08-26 | Disposition: A | Discharge status: Home / Self Care | Payer: BLUE CROSS/BLUE SHIELD | Source: Ambulatory Visit | Attending: Radiation Oncology | Admitting: Radiation Oncology

## 2016-08-26 ENCOUNTER — Encounter: Payer: Self-pay | Admitting: Radiation Oncology

## 2016-08-26 DIAGNOSIS — Z510 Encounter for antineoplastic radiation therapy: Secondary | ICD-10-CM | POA: Diagnosis not present

## 2016-08-26 NOTE — Telephone Encounter (Signed)
Patient called stating that she needs a refill of her Oxycodone 20 mg and Xanax. Patient will be here around 430 for a visit with MD [facility] and she will pick the prescription up.

## 2016-08-26 NOTE — Telephone Encounter (Signed)
Per Tanya/desk nurse (verbal), schedule patient with Lattie Haw on 04/27 @ 9:15 a.m. Lattie Haw will see patient in infusion area.   Per Ubaldo Glassing, she will adjust infusion schedule to add patient in a bed for 08/28/16 infusion.

## 2016-08-28 ENCOUNTER — Ambulatory Visit (HOSPITAL_BASED_OUTPATIENT_CLINIC_OR_DEPARTMENT_OTHER): Payer: BLUE CROSS/BLUE SHIELD | Admitting: Nurse Practitioner

## 2016-08-28 ENCOUNTER — Ambulatory Visit (HOSPITAL_BASED_OUTPATIENT_CLINIC_OR_DEPARTMENT_OTHER): Payer: BLUE CROSS/BLUE SHIELD

## 2016-08-28 ENCOUNTER — Other Ambulatory Visit (HOSPITAL_BASED_OUTPATIENT_CLINIC_OR_DEPARTMENT_OTHER): Payer: BLUE CROSS/BLUE SHIELD

## 2016-08-28 VITALS — BP 122/68 | HR 104 | Temp 98.6°F | Resp 16

## 2016-08-28 DIAGNOSIS — C7951 Secondary malignant neoplasm of bone: Secondary | ICD-10-CM | POA: Diagnosis not present

## 2016-08-28 DIAGNOSIS — R634 Abnormal weight loss: Secondary | ICD-10-CM

## 2016-08-28 DIAGNOSIS — C3492 Malignant neoplasm of unspecified part of left bronchus or lung: Secondary | ICD-10-CM | POA: Diagnosis not present

## 2016-08-28 DIAGNOSIS — D630 Anemia in neoplastic disease: Secondary | ICD-10-CM

## 2016-08-28 DIAGNOSIS — C3490 Malignant neoplasm of unspecified part of unspecified bronchus or lung: Secondary | ICD-10-CM

## 2016-08-28 DIAGNOSIS — Z5111 Encounter for antineoplastic chemotherapy: Secondary | ICD-10-CM | POA: Diagnosis not present

## 2016-08-28 LAB — COMPREHENSIVE METABOLIC PANEL WITH GFR
ALT: <6 U/L
AST: 13 U/L (ref 5–34)
Albumin: 2.5 g/dL — ABNORMAL LOW (ref 3.5–5.0)
Alkaline Phosphatase: 140 U/L (ref 40–150)
Anion Gap: 12 meq/L — ABNORMAL HIGH (ref 3–11)
BUN: 13.4 mg/dL (ref 7.0–26.0)
CO2: 22 meq/L (ref 22–29)
Calcium: 9.5 mg/dL (ref 8.4–10.4)
Chloride: 101 meq/L (ref 98–109)
Creatinine: 0.7 mg/dL (ref 0.6–1.1)
EGFR: >90 mL/min/{1.73_m2}
Glucose: 106 mg/dL (ref 70–140)
Potassium: 3.9 meq/L (ref 3.5–5.1)
Sodium: 136 meq/L (ref 136–145)
Total Bilirubin: 0.31 mg/dL (ref 0.20–1.20)
Total Protein: 6.5 g/dL (ref 6.4–8.3)

## 2016-08-28 LAB — MAGNESIUM: Magnesium: 1.5 mg/dL (ref 1.5–2.5)

## 2016-08-28 LAB — CBC WITH DIFFERENTIAL/PLATELET
BASO%: 0.2 % (ref 0.0–2.0)
BASOS ABS: 0 10*3/uL (ref 0.0–0.1)
EOS%: 0.2 % (ref 0.0–7.0)
Eosinophils Absolute: 0 10*3/uL (ref 0.0–0.5)
HCT: 27.5 % — ABNORMAL LOW (ref 34.8–46.6)
HEMOGLOBIN: 8.4 g/dL — AB (ref 11.6–15.9)
LYMPH%: 9.2 % — ABNORMAL LOW (ref 14.0–49.7)
MCH: 26.9 pg (ref 25.1–34.0)
MCHC: 30.5 g/dL — ABNORMAL LOW (ref 31.5–36.0)
MCV: 88.1 fL (ref 79.5–101.0)
MONO#: 0.7 10*3/uL (ref 0.1–0.9)
MONO%: 15.6 % — AB (ref 0.0–14.0)
NEUT#: 3.2 10*3/uL (ref 1.5–6.5)
NEUT%: 74.8 % (ref 38.4–76.8)
Platelets: 130 10*3/uL — ABNORMAL LOW (ref 145–400)
RBC: 3.12 10*6/uL — AB (ref 3.70–5.45)
RDW: 18.2 % — ABNORMAL HIGH (ref 11.2–14.5)
WBC: 4.2 10*3/uL (ref 3.9–10.3)
lymph#: 0.4 10*3/uL — ABNORMAL LOW (ref 0.9–3.3)
nRBC: 0 % (ref 0–0)

## 2016-08-28 MED ORDER — PALONOSETRON HCL INJECTION 0.25 MG/5ML
0.2500 mg | Freq: Once | INTRAVENOUS | Status: AC
  Administered 2016-08-28: 0.25 mg via INTRAVENOUS

## 2016-08-28 MED ORDER — SODIUM CHLORIDE 0.9 % IV SOLN
30.0000 mg/m2 | Freq: Once | INTRAVENOUS | Status: AC
  Administered 2016-08-28: 41 mg via INTRAVENOUS
  Filled 2016-08-28: qty 41

## 2016-08-28 MED ORDER — IRINOTECAN HCL CHEMO INJECTION 100 MG/5ML
65.0000 mg/m2 | Freq: Once | INTRAVENOUS | Status: AC
  Administered 2016-08-28: 80 mg via INTRAVENOUS
  Filled 2016-08-28: qty 4

## 2016-08-28 MED ORDER — ALPRAZOLAM 0.5 MG PO TABS
0.5000 mg | ORAL_TABLET | Freq: Once | ORAL | Status: AC
  Administered 2016-08-28: 0.5 mg via ORAL

## 2016-08-28 MED ORDER — SODIUM CHLORIDE 0.9% FLUSH
10.0000 mL | INTRAVENOUS | Status: DC | PRN
  Filled 2016-08-28: qty 10

## 2016-08-28 MED ORDER — ALPRAZOLAM 0.25 MG PO TABS: 0.2500 mg | ORAL_TABLET | Freq: Every day | ORAL | 0 refills | Status: DC | PRN

## 2016-08-28 MED ORDER — ATROPINE SULFATE 1 MG/ML IJ SOLN
0.5000 mg | Freq: Once | INTRAMUSCULAR | Status: AC | PRN
  Administered 2016-08-28: 0.5 mg via INTRAVENOUS

## 2016-08-28 MED ORDER — OXYCODONE HCL ER 20 MG PO T12A: 20.0000 mg | EXTENDED_RELEASE_TABLET | Freq: Two times a day (BID) | ORAL | 0 refills | Status: DC

## 2016-08-28 MED ORDER — SODIUM CHLORIDE 0.9 % IV SOLN
Freq: Once | INTRAVENOUS | Status: AC
  Administered 2016-08-28: 14:00:00 via INTRAVENOUS
  Filled 2016-08-28: qty 5

## 2016-08-28 MED ORDER — SODIUM CHLORIDE 0.9 % IV SOLN
Freq: Once | INTRAVENOUS | Status: AC
  Administered 2016-08-28: 10:00:00 via INTRAVENOUS

## 2016-08-28 MED ORDER — HEPARIN SOD (PORK) LOCK FLUSH 100 UNIT/ML IV SOLN
500.0000 [IU] | Freq: Once | INTRAVENOUS | Status: DC | PRN
  Filled 2016-08-28: qty 5

## 2016-08-28 MED ORDER — POTASSIUM CHLORIDE 2 MEQ/ML IV SOLN
Freq: Once | INTRAVENOUS | Status: AC
  Administered 2016-08-28: 10:00:00 via INTRAVENOUS
  Filled 2016-08-28: qty 10

## 2016-08-28 NOTE — Progress Notes (Signed)
Ok to treat with hgb 8.4; ok to start camptosar prior to urine output 212ms

## 2016-08-28 NOTE — Patient Instructions (Signed)
[facility] Discharge Instructions for Patients Receiving Chemotherapy  Today you received the following chemotherapy agents Cisplatin and Camptosar  To help prevent nausea and vomiting after your treatment, we encourage you to take your nausea medication   If you develop nausea and vomiting that is not controlled by your nausea medication, call the clinic.   BELOW ARE SYMPTOMS THAT SHOULD BE REPORTED IMMEDIATELY:  *FEVER GREATER THAN 100.5 F  *CHILLS WITH OR WITHOUT FEVER  NAUSEA AND VOMITING THAT IS NOT CONTROLLED WITH YOUR NAUSEA MEDICATION  *UNUSUAL SHORTNESS OF BREATH  *UNUSUAL BRUISING OR BLEEDING  TENDERNESS IN MOUTH AND THROAT WITH OR WITHOUT PRESENCE OF ULCERS  *URINARY PROBLEMS  *BOWEL PROBLEMS  UNUSUAL RASH Items with * indicate a potential emergency and should be followed up as soon as possible.  Feel free to call the clinic you have any questions or concerns. The clinic phone number is [phone].  Please show the CHEMO ALERT CARD at check-in to the Emergency Department and triage nurse.   

## 2016-08-28 NOTE — Progress Notes (Signed)
[place] OFFICE PROGRESS NOTE   Diagnosis:  Small cell lung cancer  INTERVAL HISTORY:   Jenny [name] returns as scheduled. She reports her pain is controlled with a combination of OxyContin and oxycodone. She has mild intermittent nausea. Diarrhea controlled with Lomotil. She denies shortness of breath. Appetite is better.  Objective:  Vital signs in last 24 hours:  Temperature 98.6, heart rate 126, but pressure 106/71, respirations 16    HEENT: No thrush or ulcers. Resp: Lungs clear bilaterally.  Cardio: Regular, tachycardic. GI: Abdomen soft and nontender. No organomegaly. Vascular: No leg edema. Neuro: Alert and oriented.    Lab Results:  Lab Results  Component Value Date   WBC 4.2 08/28/2016   HGB 8.4 (L) 08/28/2016   HCT 27.5 (L) 08/28/2016   MCV 88.1 08/28/2016   PLT 130 (L) 08/28/2016   NEUTROABS 3.2 08/28/2016    Imaging:  No results found.  Medications: I have reviewed the patient's current medications.  Assessment/Plan: 1.Limited stage small cell lung cancer, diagnosed on biopsy of a subcarinal lymph node and left lung mass by EBUSon 09/12/2015  PET scan 09/03/2015 confirmed a hypermetabolic left perihilar mass, 2 left lower lobe pulmonary masses, and mediastinal lymphadenopathy, no evidence of distant metastatic disease  Brain MRI negative for metastatic disease 09/13/2015  Status post chest radiation 09/26/2015 through 10/18/2015  Cycle 1 cisplatin/etoposide beginning 09/26/2015  Cycle 2 cisplatin/etoposide beginning 10/16/2015  Cycle 3 carboplatin/etoposide beginning 11/06/2015  12/17/2015 MRI of the brain negative  12/17/2015 CT scans chest/abdomen/pelvis (report currently not available)  Bone scan 12/27/2015 with minimal increased uptake in the posterior aspect of the right 11th rib that does not appear abnormal on the CT images of August 2017; sclerotic focus noted on the previous CT scan in the body of L1 showed no  abnormal uptake on the current bone scan; asymmetric increased uptake in the left SI joint region possibly corresponding to a subtle sclerotic focus in the medial posterior aspect of the left iliac bone  CT chest 04/07/2016-new liver metastases, new upper abdominal adenopathy, left iliac metastasis, new right posterior lateral Pleural/chest wall metastasis  Cycle 1 salvage chemotherapy with etoposide/carboplatin01/12/2016  Cycle 2 salvage chemotherapy with etoposide/carboplatin 06/09/2016-Neulasta added  Restaging CTs of the head, chest, abdomen, and pelvis on 07/06/2016-progressive skull metastases with intracranial/extracranial extension, stable lung nodules, progressive disease in the liver, bones, enlargement of a right chest wall mass, and enlarging peritoneal nodule  Cycle 1 irinotecan/cisplatin 07/13/2016  Cycle 2 irinotecan/cisplatin 07/27/2016  Cycle 3 irinotecan/cisplatin 08/10/2016  CT right arm 08/14/2016-metastatic lesion proximal right humeral shaft with a moth-eatenappearance. At risk for pathologic fracture. Metastatic lesion in the proximal right radial shaft with apparent extension into adjacent soft tissues.  CT chest 08/20/2016-overall stable disease, mixed response with a decrease in the right chest wall mass and a lingular nodule, slight increase in several of the liver lesions  Cycle 4 irinotecan/cisplatin 08/28/2016  2. 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 Weight loss secondary to #2  4. History of febrile neutropenia following chemotherapy  5. Remote history of a right leg DVT-2008?-Maintained on Lovenox  6. History of tobacco use  7. Admission to [facility] 10/27/2015 with neutropenic fever, esophagitis, and dehydration  8. Hypercalcemia 05/01/2016-status post intravenous fluids and Zometa12/29/2017  9. Fever prior to hospital admission 05/17/2016-infectious versus tumor  fever; treated for pneumonia, respiratory panel positive for coronavirus  10. 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 Altered mental status-potentially related to infection/delirium, concern for brain metastases; brain CT 05/19/2016-no brain metastases, multiple skull metastases; improved 05/28/2016, 06/22/2016.  12. History of Leukopenia/thrombocytopenia secondary to chemotherapy-Neulasta added with cycle 2 etoposide/carboplatin  13. Tachycardia/hypotension-persistent, likely secondary to dehydration   Disposition: Jenny [name] appears stable. She has completed 3 cycles of irinotecan/cisplatin. Plan to proceed with cycle 4 today as scheduled. She will return for a follow-up visit and cycle 5 in 2 weeks. She will contact the office in the interim with any problems.    Sayler, Mickiewicz ANP/GNP-BC   08/28/2016  9:21 AM

## 2016-08-28 NOTE — Addendum Note (Signed)
Addended by: Ned Card K on: 08/28/2016 11:00 AM   Modules accepted: Orders

## 2016-08-30 NOTE — Progress Notes (Signed)
  Radiation Oncology         [phone]) [phone] ________________________________  Name: Jenny [name] MRN: [id number]  Date: 08/26/2016  DOB: January 28, 1951  End of Treatment Note  Diagnosis:    66 yo woman with painful right upper extremity painful skleletal metastases from small cell carcinoma of the left lower lobe of the lung     Indication for treatment:  Palliative       Radiation treatment dates:   08/26/16  Site/dose:    1.  The right proximal humerus was treated to 8 Gy in one fraction 2.  The right proximal radius/ulna and distal humerus were treated to 8 Gy in one fraction  Beams/energy:    1.  The right proximal humerus was treated using AP/PA 6 MX X-ray beams 2.  The right proximal radius/ulna and distal humerus were treated using AP/PA 6 MX X-ray beams  Narrative: The patient tolerated radiation treatment relatively well.   No complications were noted  Plan: The patient has completed radiation treatment. The patient will return to radiation oncology clinic for routine followup in one month. I advised her to call or return sooner if she has any questions or concerns related to her recovery or treatment. ________________________________  Sheral Apley. Tammi Klippel, M.D.

## 2016-08-31 ENCOUNTER — Encounter: Payer: Self-pay | Admitting: Oncology

## 2016-08-31 ENCOUNTER — Other Ambulatory Visit (HOSPITAL_BASED_OUTPATIENT_CLINIC_OR_DEPARTMENT_OTHER): Payer: BLUE CROSS/BLUE SHIELD

## 2016-08-31 ENCOUNTER — Ambulatory Visit (HOSPITAL_BASED_OUTPATIENT_CLINIC_OR_DEPARTMENT_OTHER): Payer: BLUE CROSS/BLUE SHIELD | Admitting: Oncology

## 2016-08-31 ENCOUNTER — Ambulatory Visit (HOSPITAL_BASED_OUTPATIENT_CLINIC_OR_DEPARTMENT_OTHER): Payer: BLUE CROSS/BLUE SHIELD

## 2016-08-31 ENCOUNTER — Telehealth: Payer: Self-pay | Admitting: *Deleted

## 2016-08-31 VITALS — BP 109/68 | HR 133 | Temp 98.6°F | Resp 19 | Ht 60.0 in | Wt 95.2 lb

## 2016-08-31 DIAGNOSIS — C3490 Malignant neoplasm of unspecified part of unspecified bronchus or lung: Secondary | ICD-10-CM | POA: Diagnosis not present

## 2016-08-31 DIAGNOSIS — E860 Dehydration: Secondary | ICD-10-CM

## 2016-08-31 DIAGNOSIS — C3492 Malignant neoplasm of unspecified part of left bronchus or lung: Secondary | ICD-10-CM

## 2016-08-31 DIAGNOSIS — K5903 Drug induced constipation: Secondary | ICD-10-CM | POA: Diagnosis not present

## 2016-08-31 DIAGNOSIS — K5900 Constipation, unspecified: Secondary | ICD-10-CM | POA: Insufficient documentation

## 2016-08-31 DIAGNOSIS — R112 Nausea with vomiting, unspecified: Secondary | ICD-10-CM | POA: Diagnosis not present

## 2016-08-31 LAB — COMPREHENSIVE METABOLIC PANEL
ALBUMIN: 2.6 g/dL — AB (ref 3.5–5.0)
ALK PHOS: 145 U/L (ref 40–150)
ALT: 6 U/L (ref 0–55)
AST: 37 U/L — AB (ref 5–34)
Anion Gap: 13 mEq/L — ABNORMAL HIGH (ref 3–11)
BILIRUBIN TOTAL: 0.38 mg/dL (ref 0.20–1.20)
BUN: 17.1 mg/dL (ref 7.0–26.0)
CO2: 21 meq/L — AB (ref 22–29)
Calcium: 8.9 mg/dL (ref 8.4–10.4)
Chloride: 99 mEq/L (ref 98–109)
Creatinine: 0.7 mg/dL (ref 0.6–1.1)
GLUCOSE: 95 mg/dL (ref 70–140)
Potassium: 4.9 mEq/L (ref 3.5–5.1)
SODIUM: 134 meq/L — AB (ref 136–145)
TOTAL PROTEIN: 6.6 g/dL (ref 6.4–8.3)

## 2016-08-31 LAB — CBC WITH DIFFERENTIAL/PLATELET
BASO%: 0.4 % (ref 0.0–2.0)
BASOS ABS: 0 10*3/uL (ref 0.0–0.1)
EOS ABS: 0 10*3/uL (ref 0.0–0.5)
EOS%: 0.8 % (ref 0.0–7.0)
HCT: 28.7 % — ABNORMAL LOW (ref 34.8–46.6)
HEMOGLOBIN: 9.1 g/dL — AB (ref 11.6–15.9)
LYMPH%: 6.5 % — ABNORMAL LOW (ref 14.0–49.7)
MCH: 27.1 pg (ref 25.1–34.0)
MCHC: 31.6 g/dL (ref 31.5–36.0)
MCV: 85.7 fL (ref 79.5–101.0)
MONO#: 0.2 10*3/uL (ref 0.1–0.9)
MONO%: 5.9 % (ref 0.0–14.0)
NEUT%: 86.4 % — AB (ref 38.4–76.8)
NEUTROS ABS: 2.4 10*3/uL (ref 1.5–6.5)
Platelets: 135 10*3/uL — ABNORMAL LOW (ref 145–400)
RBC: 3.35 10*6/uL — AB (ref 3.70–5.45)
RDW: 19.1 % — ABNORMAL HIGH (ref 11.2–14.5)
WBC: 2.7 10*3/uL — ABNORMAL LOW (ref 3.9–10.3)
lymph#: 0.2 10*3/uL — ABNORMAL LOW (ref 0.9–3.3)

## 2016-08-31 LAB — MAGNESIUM: Magnesium: 1.8 mg/dl (ref 1.5–2.5)

## 2016-08-31 MED ORDER — SODIUM CHLORIDE 0.9 % IV SOLN
INTRAVENOUS | Status: DC
  Administered 2016-08-31: 14:00:00 via INTRAVENOUS

## 2016-08-31 MED ORDER — DEXAMETHASONE SODIUM PHOSPHATE 10 MG/ML IJ SOLN: 10.0000 mg | Freq: Once | INTRAMUSCULAR | Status: DC

## 2016-08-31 MED ORDER — DEXAMETHASONE SODIUM PHOSPHATE 10 MG/ML IJ SOLN
INTRAMUSCULAR | Status: AC
  Filled 2016-08-31: qty 1

## 2016-08-31 MED ORDER — DEXAMETHASONE 4 MG PO TABS: 4.0000 mg | ORAL_TABLET | Freq: Two times a day (BID) | ORAL | 0 refills | Status: DC

## 2016-08-31 MED ORDER — OXYCODONE-ACETAMINOPHEN 5-325 MG PO TABS
2.0000 | ORAL_TABLET | Freq: Once | ORAL | Status: AC
Start: 2016-08-31 — End: 2016-08-31
  Administered 2016-08-31: 2 via ORAL

## 2016-08-31 MED ORDER — DEXAMETHASONE SODIUM PHOSPHATE 100 MG/10ML IJ SOLN
10.0000 mg | Freq: Once | INTRAMUSCULAR | Status: DC
  Administered 2016-08-31: 10 mg via INTRAVENOUS

## 2016-08-31 NOTE — Telephone Encounter (Signed)
Dr. Benay Spice notified of patients symptoms.  Verbal order received and read back from Dr. Benay Spice for patient to be seen by [facility] today.  Order given to A.P.P. at this time.  Scheduling message sent.  Called patient notifying her to come in now.

## 2016-08-31 NOTE — Telephone Encounter (Signed)
"  I received treatment Friday (C4 Cisplatin/Carboplatin).  I've been so sick all weekend.  My stomach, head, back and everything hurts.  I can barely sit up.  I have not been able to eat or drink ensure or anything.  Vomiting small amounts, last time was on Saturday.  LBM on Sunday was balls with some straining.  I took the Oxy IR last at 6:00 am and the oxycodone at 8:00 am.  My parents are with me.  Return number [phone]."    Will notify provider.  Nurse instructions to try to drink liquids.  Encouraged Gatorade.  Warm/hot beverages to stimulate bowels.

## 2016-08-31 NOTE — Patient Instructions (Signed)
Return tomorrow for additional IV fluids.  I have sent a prescription for dexamethasone 4 mg twice a day for the next 2 days to help control your nausea. Take this medication with food.  For constipation, restart MiraLAX 1 capful mixed in water or juice daily along with Senokot S 2 tablets twice a day. Stopped this medication if you develop loose stools.

## 2016-08-31 NOTE — Patient Instructions (Signed)
Dehydration, Adult Dehydration is a condition in which there is not enough fluid or water in the body. This happens when you lose more fluids than you take in. Important organs, such as the kidneys, brain, and heart, cannot function without a proper amount of fluids. Any loss of fluids from the body can lead to dehydration. Dehydration can range from mild to severe. This condition should be treated right away to prevent it from becoming severe. What are the causes? This condition may be caused by:  Vomiting.  Diarrhea.  Excessive sweating, such as from heat exposure or exercise.  Not drinking enough fluid, especially:  When ill.  While doing activity that requires a lot of energy.  Excessive urination.  Fever.  Infection.  Certain medicines, such as medicines that cause the body to lose excess fluid (diuretics).  Inability to access safe drinking water.  Reduced physical ability to get adequate water and food. What increases the risk? This condition is more likely to develop in people:  Who have a poorly controlled long-term (chronic) illness, such as diabetes, heart disease, or kidney disease.  Who are age 65 or older.  Who are disabled.  Who live in a place with high altitude.  Who play endurance sports. What are the signs or symptoms? Symptoms of mild dehydration may include:   Thirst.  Dry lips.  Slightly dry mouth.  Dry, warm skin.  Dizziness. Symptoms of moderate dehydration may include:   Very dry mouth.  Muscle cramps.  Dark urine. Urine may be the color of tea.  Decreased urine production.  Decreased tear production.  Heartbeat that is irregular or faster than normal (palpitations).  Headache.  Light-headedness, especially when you stand up from a sitting position.  Fainting (syncope). Symptoms of severe dehydration may include:   Changes in skin, such as:  Cold and clammy skin.  Blotchy (mottled) or pale skin.  Skin that does  not quickly return to normal after being lightly pinched and released (poor skin turgor).  Changes in body fluids, such as:  Extreme thirst.  No tear production.  Inability to sweat when body temperature is high, such as in hot weather.  Very little urine production.  Changes in vital signs, such as:  Weak pulse.  Pulse that is more than 100 beats a minute when sitting still.  Rapid breathing.  Low blood pressure.  Other changes, such as:  Sunken eyes.  Cold hands and feet.  Confusion.  Lack of energy (lethargy).  Difficulty waking up from sleep.  Short-term weight loss.  Unconsciousness. How is this diagnosed? This condition is diagnosed based on your symptoms and a physical exam. Blood and urine tests may be done to help confirm the diagnosis. How is this treated? Treatment for this condition depends on the severity. Mild or moderate dehydration can often be treated at home. Treatment should be started right away. Do not wait until dehydration becomes severe. Severe dehydration is an emergency and it needs to be treated in a hospital. Treatment for mild dehydration may include:   Drinking more fluids.  Replacing salts and minerals in your blood (electrolytes) that you may have lost. Treatment for moderate dehydration may include:   Drinking an oral rehydration solution (ORS). This is a drink that helps you replace fluids and electrolytes (rehydrate). It can be found at pharmacies and retail stores. Treatment for severe dehydration may include:   Receiving fluids through an IV tube.  Receiving an electrolyte solution through a feeding tube that is   passed through your nose and into your stomach (nasogastric tube, or NG tube).  Correcting any abnormalities in electrolytes.  Treating the underlying cause of dehydration. Follow these instructions at home:  If directed by your health care provider, drink an ORS:  Make an ORS by following instructions on the  package.  Start by drinking small amounts, about  cup (120 mL) every 5-10 minutes.  Slowly increase how much you drink until you have taken the amount recommended by your health care provider.  Drink enough clear fluid to keep your urine clear or pale yellow. If you were told to drink an ORS, finish the ORS first, then start slowly drinking other clear fluids. Drink fluids such as:  Water. Do not drink only water. Doing that can lead to having too little salt (sodium) in the body (hyponatremia).  Ice chips.  Fruit juice that you have added water to (diluted fruit juice).  Low-calorie sports drinks.  Avoid:  Alcohol.  Drinks that contain a lot of sugar. These include high-calorie sports drinks, fruit juice that is not diluted, and soda.  Caffeine.  Foods that are greasy or contain a lot of fat or sugar.  Take over-the-counter and prescription medicines only as told by your health care provider.  Do not take sodium tablets. This can lead to having too much sodium in the body (hypernatremia).  Eat foods that contain a healthy balance of electrolytes, such as bananas, oranges, potatoes, tomatoes, and spinach.  Keep all follow-up visits as told by your health care provider. This is important. Contact a health care provider if:  You have abdominal pain that:  Gets worse.  Stays in one area (localizes).  You have a rash.  You have a stiff neck.  You are more irritable than usual.  You are sleepier or more difficult to wake up than usual.  You feel weak or dizzy.  You feel very thirsty.  You have urinated only a small amount of very dark urine over 6-8 hours. Get help right away if:  You have symptoms of severe dehydration.  You cannot drink fluids without vomiting.  Your symptoms get worse with treatment.  You have a fever.  You have a severe headache.  You have vomiting or diarrhea that:  Gets worse.  Does not go away.  You have blood or green matter  (bile) in your vomit.  You have blood in your stool. This may cause stool to look black and tarry.  You have not urinated in 6-8 hours.  You faint.  Your heart rate while sitting still is over 100 beats a minute.  You have trouble breathing. This information is not intended to replace advice given to you by your health care provider. Make sure you discuss any questions you have with your health care provider. Document Released: 04/20/2005 Document Revised: 11/15/2015 Document Reviewed: 06/14/2015 Elsevier Interactive Patient Education  2017 [employer]  

## 2016-08-31 NOTE — Progress Notes (Signed)
SYMPTOM MANAGEMENT CLINIC    Chief Complaint: Pain and nausea  HPI:  Jenny [name] 66 y.o. female diagnosed with small cell lung cancer currently on treatment with irinotecan and cisplatin. She received cycle 4 for chemotherapy on 08/28/2016. The patient felt well the chemotherapy, but developed pain in her upper back, headache, abdominal pain the next day. She also developed nausea and spitting up clear liquid. She has not really been able to eat or drink very well. The patient takes OxyContin 20 mg twice a day along with oxycodone 10-15 mg every 4 hours as needed for breakthrough pain. She has been taking her oxycodone about every 4 hours this past weekend. Her pain medication is not really been helping her pain. She has requested pain medication here in our office since she did not bring her pain medication with her. The patient tells that she has not been taking anything for nausea. Bowels have been moving, but the patient is straining with her bowel movements. Reports that her abdominal discomfort improves after her bowels move. Denies fevers and chills. Denies chest pain or shortness of breath. Denies visual changes.   No history exists.    Review of Systems  Constitutional: Positive for malaise/fatigue. Negative for chills, fever and weight loss.  HENT: Negative.   Eyes: Negative for blurred vision, double vision and pain.  Respiratory: Negative.   Cardiovascular: Negative.   Gastrointestinal: Positive for constipation, nausea and vomiting. Negative for abdominal pain, blood in stool and diarrhea.  Genitourinary: Negative.   Musculoskeletal: Positive for back pain. Negative for falls.  Skin: Negative.   Neurological: Positive for weakness and headaches. Negative for dizziness and focal weakness.  Endo/Heme/Allergies: Negative.   Psychiatric/Behavioral: Negative.     Past Medical History:  Diagnosis Date  . DVT (deep venous thrombosis) ([place])    1 year ago  . History of  chemotherapy   . Hx of radiation therapy   . Hypercholesteremia   . Lung cancer [facility])     Past Surgical History:  Procedure Laterality Date  . CESAREAN SECTION    . ESOPHAGOGASTRODUODENOSCOPY (EGD) WITH PROPOFOL N/A 11/12/2015   Procedure: ESOPHAGOGASTRODUODENOSCOPY (EGD) WITH PROPOFOL;  Surgeon: Doran Stabler, MD;  Location: [facility] ENDOSCOPY;  Service: Endoscopy;  Laterality: N/A;  With propofol if available    has Hyperlipidemia; DVT, lower extremity, recurrent ([place]); Nicotine use disorder; Encounter for therapeutic drug monitoring; Other acute sinusitis; Anticoagulant long-term use; Cough over 2 weeks; Lung mass; Community acquired pneumonia of left lower lobe of lung ([place]); Hemoptysis; COPD (chronic obstructive pulmonary disease) ([place]); Small cell lung cancer ([place]); Dysphagia; Odynophagia; Anorexia; Esophagitis; Malnutrition of moderate degree; Goals of care, counseling/discussion; Anemia associated with chemotherapy; HCAP (healthcare-associated pneumonia); Encephalopathy; Dehydration with hyponatremia; Pressure injury of skin; Protein-calorie malnutrition, severe; Altered mental status; and Encephalopathy acute on her problem list.    is allergic to fentanyl and morphine and related.  Allergies as of 08/31/2016      Reactions   Fentanyl    Patient prefers to never take this medication again. She doesn't like the way it makes her feel.    Morphine And Related Itching, Rash      Medication List       Accurate as of 08/31/16  4:45 PM. Always use your most recent med list.          acetaminophen 500 MG tablet Commonly known as:  TYLENOL Take 500 mg by mouth every 8 (eight) hours as needed.   ALPRAZolam 0.25 MG  tablet Commonly known as:  XANAX Take 1 tablet (0.25 mg total) by mouth daily as needed for anxiety or sleep.   dexamethasone 4 MG tablet Commonly known as:  DECADRON Take 1 tablet (4 mg total) by mouth 2 (two) times daily with a meal.   diphenoxylate-atropine  2.5-0.025 MG tablet Commonly known as:  LOMOTIL Take 2 tablets by mouth 4 (four) times daily as needed for diarrhea or loose stools.   DULoxetine 30 MG capsule Commonly known as:  CYMBALTA Take 30 mg by mouth daily.   enoxaparin 80 MG/0.8ML injection Commonly known as:  LOVENOX Inject 0.65 mLs (65 mg total) into the skin daily.   feeding supplement (ENSURE ENLIVE) Liqd Take 237 mLs by mouth 2 (two) times daily between meals.   guaiFENesin 600 MG 12 hr tablet Commonly known as:  MUCINEX Take 1 tablet (600 mg total) by mouth 2 (two) times daily.   loperamide 2 MG capsule Commonly known as:  IMODIUM Take 2 mg by mouth as needed for diarrhea or loose stools.   multivitamin with minerals Tabs tablet Take 1 tablet by mouth daily.   ondansetron 8 MG disintegrating tablet Commonly known as:  ZOFRAN-ODT Take 1 tablet (8 mg total) by mouth every 8 (eight) hours as needed for nausea or vomiting.   oxyCODONE 5 MG immediate release tablet Commonly known as:  Oxy IR/ROXICODONE Take 2-3 tablets (10-15 mg total) by mouth every 4 (four) hours as needed for severe pain.   oxyCODONE 20 mg 12 hr tablet Commonly known as:  OXYCONTIN Take 1 tablet (20 mg total) by mouth every 12 (twelve) hours.   polyethylene glycol packet Commonly known as:  MIRALAX Take 17 g by mouth daily as needed for mild constipation.   prochlorperazine 10 MG tablet Commonly known as:  COMPAZINE Take 1 tablet (10 mg total) by mouth every 6 (six) hours as needed for nausea or vomiting.   senna-docusate 8.6-50 MG tablet Commonly known as:  Senokot-S Take 1 tablet by mouth 2 (two) times daily.        PHYSICAL EXAMINATION  Oncology Vitals 08/31/2016 08/31/2016  Height - 152 cm  Weight - 43.182 kg  Weight (lbs) - 95 lbs 3 oz  BMI (kg/m2) - 18.59 kg/m2  Temp - 98.6  Pulse 108 133  Resp - 19  SpO2 - 98  BSA (m2) - 1.35 m2   BP Readings from Last 2 Encounters:  08/31/16 108/64  08/31/16 109/68    Physical  Exam  Constitutional: She is oriented to person, place, and time and well-developed, well-nourished, and in no distress. No distress.  HENT:  Head: Normocephalic and atraumatic.  Mouth/Throat: Oropharynx is clear and moist. No oropharyngeal exudate.  Eyes: EOM are normal. Pupils are equal, round, and reactive to light. No scleral icterus.  Neck: Normal range of motion. Neck supple. No thyromegaly present.  Cardiovascular:  No murmur heard. Heart rate 133 upon arrival to the cancer center. Heart rate rechecked after IV fluids and down to 108.  Pulmonary/Chest: Effort normal and breath sounds normal. No respiratory distress. She has no wheezes.  Abdominal: Soft. Bowel sounds are normal. She exhibits no distension and no mass. There is no tenderness. There is no rebound.  Musculoskeletal: Normal range of motion. She exhibits no edema.  Lymphadenopathy:    She has no cervical adenopathy.  Neurological: She is alert and oriented to person, place, and time. Gait normal.  Tongue deviates the left. This is consistent with prior physical exam findings.  Skin: Skin is warm and dry. No rash noted. No erythema.  Psychiatric: Mood, memory, affect and judgment normal.  Vitals reviewed.   LABORATORY DATA:. Appointment on 08/31/2016  Component Date Value Ref Range Status  . WBC 08/31/2016 2.7* 3.9 - 10.3 10e3/uL Final  . NEUT# 08/31/2016 2.4  1.5 - 6.5 10e3/uL Final  . HGB 08/31/2016 9.1* 11.6 - 15.9 g/dL Final  . HCT 08/31/2016 28.7* 34.8 - 46.6 % Final  . Platelets 08/31/2016 135* 145 - 400 10e3/uL Final  . MCV 08/31/2016 85.7  79.5 - 101.0 fL Final  . MCH 08/31/2016 27.1  25.1 - 34.0 pg Final  . MCHC 08/31/2016 31.6  31.5 - 36.0 g/dL Final  . RBC 08/31/2016 3.35* 3.70 - 5.45 10e6/uL Final  . RDW 08/31/2016 19.1* 11.2 - 14.5 % Final  . lymph# 08/31/2016 0.2* 0.9 - 3.3 10e3/uL Final  . MONO# 08/31/2016 0.2  0.1 - 0.9 10e3/uL Final  . Eosinophils Absolute 08/31/2016 0.0  0.0 - 0.5 10e3/uL Final    . Basophils Absolute 08/31/2016 0.0  0.0 - 0.1 10e3/uL Final  . NEUT% 08/31/2016 86.4* 38.4 - 76.8 % Final  . LYMPH% 08/31/2016 6.5* 14.0 - 49.7 % Final  . MONO% 08/31/2016 5.9  0.0 - 14.0 % Final  . EOS% 08/31/2016 0.8  0.0 - 7.0 % Final  . BASO% 08/31/2016 0.4  0.0 - 2.0 % Final  . Sodium 08/31/2016 134* 136 - 145 mEq/L Final  . Potassium 08/31/2016 4.9  3.5 - 5.1 mEq/L Final  . Chloride 08/31/2016 99  98 - 109 mEq/L Final  . CO2 08/31/2016 21* 22 - 29 mEq/L Final  . Glucose 08/31/2016 95  70 - 140 mg/dl Final  . BUN 08/31/2016 17.1  7.0 - 26.0 mg/dL Final  . Creatinine 08/31/2016 0.7  0.6 - 1.1 mg/dL Final  . Total Bilirubin 08/31/2016 0.38  0.20 - 1.20 mg/dL Final  . Alkaline Phosphatase 08/31/2016 145  40 - 150 U/L Final  . AST 08/31/2016 37* 5 - 34 U/L Final  . ALT 08/31/2016 6  0 - 55 U/L Final  . Total Protein 08/31/2016 6.6  6.4 - 8.3 g/dL Final  . Albumin 08/31/2016 2.6* 3.5 - 5.0 g/dL Final  . Calcium 08/31/2016 8.9  8.4 - 10.4 mg/dL Final  . Anion Gap 08/31/2016 13* 3 - 11 mEq/L Final  . EGFR 08/31/2016 >90  >90 ml/min/1.73 m2 Final  . Magnesium 08/31/2016 1.8  1.5 - 2.5 mg/dl Final    RADIOGRAPHIC STUDIES: No results found.  ASSESSMENT/PLAN:    No problem-specific Assessment & Plan notes found for this encounter.  This is a 66 year old female with small cell lung cancer currently receiving chemotherapy with irinotecan and cisplatin. She received her fourth cycle of chemotherapy on 08/28/2016. This past weekend she developed increased pain to her upper back, headache, abdominal discomfort. After receiving 2 Percocet here in our office today she reports that her pain has resolved. Reviewed how she is taking her OxyContin and oxycodone and reinforce that she take OxyContin 20 mg twice a day and she may use her oxycodone tablets and take 2-3 tablets every 4 hours as needed for breakthrough pain.  Patient is having nausea which is likely delayed from her chemotherapy. She  is not eating or drinking and she received a liter of IV fluids here in our office and felt much better afterward. She received dexamethasone 10 mg IV for delayed nausea and was given a prescription for dexamethasone 4 mg tablets twice  a day for the next 2 days for treatment treatment of her delayed nausea. She will return tomorrow for additional IV fluids.  For her abdominal discomfort, I suspect this is due to constipation. Reviewed with her that she should restart her MiraLAX 1 capful mixed in water or juice daily along with Senokot-S 2 tablets twice a day. She should stop this if she develops any loose stools.  After receiving pain medication, IV fluids, and IV dexamethasone the patient felt much better and was able to walk out of here without any difficulty. She will return tomorrow for additional IV fluids.  The patient was seen and examined with Dr. Benay Spice.  Patient stated understanding of all instructions; and was in agreement with this plan of care. The patient knows to call the clinic with any problems, questions or concerns.    Mikey Bussing, NP 08/31/2016  This was a shared visit with Mikey Bussing. Jenny [name] was interviewed and examined. She has delayed nausea following irinotecan/cisplatin chemotherapy given on 08/28/2016. She received intravenous fluids and will complete a course of Decadron therapy.  Her overall performance status appears unchanged. 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  Julieanne Manson, M.D.

## 2016-09-01 ENCOUNTER — Ambulatory Visit (HOSPITAL_BASED_OUTPATIENT_CLINIC_OR_DEPARTMENT_OTHER): Payer: BLUE CROSS/BLUE SHIELD

## 2016-09-01 DIAGNOSIS — E860 Dehydration: Secondary | ICD-10-CM | POA: Diagnosis not present

## 2016-09-01 MED ORDER — SODIUM CHLORIDE 0.9 % IV SOLN
1000.0000 mL | INTRAVENOUS | Status: DC
  Administered 2016-09-01: 11:00:00 via INTRAVENOUS

## 2016-09-01 NOTE — Patient Instructions (Signed)
Dehydration, Adult Dehydration is a condition in which there is not enough fluid or water in the body. This happens when you lose more fluids than you take in. Important organs, such as the kidneys, brain, and heart, cannot function without a proper amount of fluids. Any loss of fluids from the body can lead to dehydration. Dehydration can range from mild to severe. This condition should be treated right away to prevent it from becoming severe. What are the causes? This condition may be caused by:  Vomiting.  Diarrhea.  Excessive sweating, such as from heat exposure or exercise.  Not drinking enough fluid, especially:  When ill.  While doing activity that requires a lot of energy.  Excessive urination.  Fever.  Infection.  Certain medicines, such as medicines that cause the body to lose excess fluid (diuretics).  Inability to access safe drinking water.  Reduced physical ability to get adequate water and food. What increases the risk? This condition is more likely to develop in people:  Who have a poorly controlled long-term (chronic) illness, such as diabetes, heart disease, or kidney disease.  Who are age 65 or older.  Who are disabled.  Who live in a place with high altitude.  Who play endurance sports. What are the signs or symptoms? Symptoms of mild dehydration may include:   Thirst.  Dry lips.  Slightly dry mouth.  Dry, warm skin.  Dizziness. Symptoms of moderate dehydration may include:   Very dry mouth.  Muscle cramps.  Dark urine. Urine may be the color of tea.  Decreased urine production.  Decreased tear production.  Heartbeat that is irregular or faster than normal (palpitations).  Headache.  Light-headedness, especially when you stand up from a sitting position.  Fainting (syncope). Symptoms of severe dehydration may include:   Changes in skin, such as:  Cold and clammy skin.  Blotchy (mottled) or pale skin.  Skin that does  not quickly return to normal after being lightly pinched and released (poor skin turgor).  Changes in body fluids, such as:  Extreme thirst.  No tear production.  Inability to sweat when body temperature is high, such as in hot weather.  Very little urine production.  Changes in vital signs, such as:  Weak pulse.  Pulse that is more than 100 beats a minute when sitting still.  Rapid breathing.  Low blood pressure.  Other changes, such as:  Sunken eyes.  Cold hands and feet.  Confusion.  Lack of energy (lethargy).  Difficulty waking up from sleep.  Short-term weight loss.  Unconsciousness. How is this diagnosed? This condition is diagnosed based on your symptoms and a physical exam. Blood and urine tests may be done to help confirm the diagnosis. How is this treated? Treatment for this condition depends on the severity. Mild or moderate dehydration can often be treated at home. Treatment should be started right away. Do not wait until dehydration becomes severe. Severe dehydration is an emergency and it needs to be treated in a hospital. Treatment for mild dehydration may include:   Drinking more fluids.  Replacing salts and minerals in your blood (electrolytes) that you may have lost. Treatment for moderate dehydration may include:   Drinking an oral rehydration solution (ORS). This is a drink that helps you replace fluids and electrolytes (rehydrate). It can be found at pharmacies and retail stores. Treatment for severe dehydration may include:   Receiving fluids through an IV tube.  Receiving an electrolyte solution through a feeding tube that is   passed through your nose and into your stomach (nasogastric tube, or NG tube).  Correcting any abnormalities in electrolytes.  Treating the underlying cause of dehydration. Follow these instructions at home:  If directed by your health care provider, drink an ORS:  Make an ORS by following instructions on the  package.  Start by drinking small amounts, about  cup (120 mL) every 5-10 minutes.  Slowly increase how much you drink until you have taken the amount recommended by your health care provider.  Drink enough clear fluid to keep your urine clear or pale yellow. If you were told to drink an ORS, finish the ORS first, then start slowly drinking other clear fluids. Drink fluids such as:  Water. Do not drink only water. Doing that can lead to having too little salt (sodium) in the body (hyponatremia).  Ice chips.  Fruit juice that you have added water to (diluted fruit juice).  Low-calorie sports drinks.  Avoid:  Alcohol.  Drinks that contain a lot of sugar. These include high-calorie sports drinks, fruit juice that is not diluted, and soda.  Caffeine.  Foods that are greasy or contain a lot of fat or sugar.  Take over-the-counter and prescription medicines only as told by your health care provider.  Do not take sodium tablets. This can lead to having too much sodium in the body (hypernatremia).  Eat foods that contain a healthy balance of electrolytes, such as bananas, oranges, potatoes, tomatoes, and spinach.  Keep all follow-up visits as told by your health care provider. This is important. Contact a health care provider if:  You have abdominal pain that:  Gets worse.  Stays in one area (localizes).  You have a rash.  You have a stiff neck.  You are more irritable than usual.  You are sleepier or more difficult to wake up than usual.  You feel weak or dizzy.  You feel very thirsty.  You have urinated only a small amount of very dark urine over 6-8 hours. Get help right away if:  You have symptoms of severe dehydration.  You cannot drink fluids without vomiting.  Your symptoms get worse with treatment.  You have a fever.  You have a severe headache.  You have vomiting or diarrhea that:  Gets worse.  Does not go away.  You have blood or green matter  (bile) in your vomit.  You have blood in your stool. This may cause stool to look black and tarry.  You have not urinated in 6-8 hours.  You faint.  Your heart rate while sitting still is over 100 beats a minute.  You have trouble breathing. This information is not intended to replace advice given to you by your health care provider. Make sure you discuss any questions you have with your health care provider. Document Released: 04/20/2005 Document Revised: 11/15/2015 Document Reviewed: 06/14/2015 Elsevier Interactive Patient Education  2017 [employer].    Constipation, Adult Constipation is when a person has fewer bowel movements in a week than normal, has difficulty having a bowel movement, or has stools that are dry, hard, or larger than normal. Constipation may be caused by an underlying condition. It may become worse with age if a person takes certain medicines and does not take in enough fluids. Follow these instructions at home: Eating and drinking    Eat foods that have a lot of fiber, such as fresh fruits and vegetables, whole grains, and beans.  Limit foods that are high in fat, low  in fiber, or overly processed, such as french fries, hamburgers, cookies, candies, and soda.  Drink enough fluid to keep your urine clear or pale yellow. General instructions   Exercise regularly or as told by your health care provider.  Go to the restroom when you have the urge to go. Do not hold it in.  Take over-the-counter and prescription medicines only as told by your health care provider. These include any fiber supplements.  Practice pelvic floor retraining exercises, such as deep breathing while relaxing the lower abdomen and pelvic floor relaxation during bowel movements.  Watch your condition for any changes.  Keep all follow-up visits as told by your health care provider. This is important. Contact a health care provider if:  You have pain that gets worse.  You have a  fever.  You do not have a bowel movement after 4 days.  You vomit.  You are not hungry.  You lose weight.  You are bleeding from the anus.  You have thin, pencil-like stools. Get help right away if:  You have a fever and your symptoms suddenly get worse.  You leak stool or have blood in your stool.  Your abdomen is bloated.  You have severe pain in your abdomen.  You feel dizzy or you faint. This information is not intended to replace advice given to you by your health care provider. Make sure you discuss any questions you have with your health care provider. Document Released: 01/17/2004 Document Revised: 11/08/2015 Document Reviewed: 10/09/2015 Elsevier Interactive Patient Education  2017 [employer].

## 2016-09-01 NOTE — Progress Notes (Signed)
Denies nausea vomiting or diarrhea. Still not drinking much fluids. No BM last night or this am. Denies pain. Pt states she did not take sennakot as instructed yesterday. Was given written instructions as well as verbal instructions. Pt states she didn't think she was supposed to take it.  Reinforced need for use of sennakot as her last BM over the weekend was hard and pt had to strain. Pt 's sister with patient today as well and re-inforced to her the need for pt to take sennakot. Both voiced understanding.

## 2016-09-03 ENCOUNTER — Telehealth: Payer: Self-pay

## 2016-09-03 NOTE — Telephone Encounter (Signed)
Pt called requesting fluids tomorrow. She stated she was instructed to call if she needed fluids. Last IVF 5/1. Dr Benay Spice unavailable, s/w [facility] RN and she concurs with pt instructions. inbasket sent for appt. Forwarded message to Dr Benay Spice.

## 2016-09-04 ENCOUNTER — Other Ambulatory Visit: Payer: Self-pay | Admitting: Nurse Practitioner

## 2016-09-04 ENCOUNTER — Ambulatory Visit (HOSPITAL_BASED_OUTPATIENT_CLINIC_OR_DEPARTMENT_OTHER): Payer: BLUE CROSS/BLUE SHIELD

## 2016-09-04 ENCOUNTER — Ambulatory Visit (HOSPITAL_BASED_OUTPATIENT_CLINIC_OR_DEPARTMENT_OTHER): Payer: BLUE CROSS/BLUE SHIELD | Admitting: Nurse Practitioner

## 2016-09-04 ENCOUNTER — Other Ambulatory Visit: Payer: Self-pay | Admitting: *Deleted

## 2016-09-04 VITALS — BP 103/56 | HR 106 | Temp 98.6°F | Resp 20 | Ht 60.0 in | Wt 91.7 lb

## 2016-09-04 VITALS — HR 104

## 2016-09-04 DIAGNOSIS — R410 Disorientation, unspecified: Secondary | ICD-10-CM | POA: Diagnosis not present

## 2016-09-04 DIAGNOSIS — C3492 Malignant neoplasm of unspecified part of left bronchus or lung: Secondary | ICD-10-CM

## 2016-09-04 DIAGNOSIS — C3490 Malignant neoplasm of unspecified part of unspecified bronchus or lung: Secondary | ICD-10-CM

## 2016-09-04 DIAGNOSIS — R110 Nausea: Secondary | ICD-10-CM

## 2016-09-04 DIAGNOSIS — G893 Neoplasm related pain (acute) (chronic): Secondary | ICD-10-CM | POA: Diagnosis not present

## 2016-09-04 DIAGNOSIS — M549 Dorsalgia, unspecified: Secondary | ICD-10-CM | POA: Diagnosis not present

## 2016-09-04 LAB — COMPREHENSIVE METABOLIC PANEL
ALT: 6 U/L (ref 0–55)
AST: 13 U/L (ref 5–34)
Albumin: 2.2 g/dL — ABNORMAL LOW (ref 3.5–5.0)
Alkaline Phosphatase: 103 U/L (ref 40–150)
Anion Gap: 11 mEq/L (ref 3–11)
BUN: 10.8 mg/dL (ref 7.0–26.0)
CO2: 24 mEq/L (ref 22–29)
Calcium: 8.3 mg/dL — ABNORMAL LOW (ref 8.4–10.4)
Chloride: 99 mEq/L (ref 98–109)
Creatinine: 0.6 mg/dL (ref 0.6–1.1)
EGFR: >90 mL/min/{1.73_m2} (ref >90)
Glucose: 104 mg/dl (ref 70–140)
Potassium: 4 mEq/L (ref 3.5–5.1)
Sodium: 134 mEq/L — ABNORMAL LOW (ref 136–145)
Total Bilirubin: 0.29 mg/dL (ref 0.20–1.20)
Total Protein: 5.4 g/dL — ABNORMAL LOW (ref 6.4–8.3)

## 2016-09-04 MED ORDER — SODIUM CHLORIDE 0.9 % IV SOLN: Freq: Once | INTRAVENOUS | Status: DC

## 2016-09-04 MED ORDER — DEXAMETHASONE SODIUM PHOSPHATE 10 MG/ML IJ SOLN: 10.0000 mg | Freq: Once | INTRAMUSCULAR | Status: DC

## 2016-09-04 MED ORDER — SODIUM CHLORIDE 0.9 % IV SOLN
10.0000 mg | Freq: Once | INTRAVENOUS | Status: DC
  Administered 2016-09-04: 10 mg via INTRAVENOUS

## 2016-09-04 MED ORDER — ONDANSETRON HCL 4 MG/2ML IJ SOLN
8.0000 mg | Freq: Once | INTRAMUSCULAR | Status: AC
  Administered 2016-09-04: 8 mg via INTRAVENOUS

## 2016-09-04 MED ORDER — ONDANSETRON HCL 4 MG/2ML IJ SOLN
INTRAMUSCULAR | Status: AC
  Filled 2016-09-04: qty 4

## 2016-09-04 MED ORDER — SODIUM CHLORIDE 0.9 % IV SOLN
1000.0000 mL | Freq: Once | INTRAVENOUS | Status: AC
  Administered 2016-09-04: 10:00:00 via INTRAVENOUS

## 2016-09-04 MED ORDER — OXYCODONE HCL 5 MG PO TABS: 10.0000 mg | ORAL_TABLET | ORAL | 0 refills | Status: DC | PRN

## 2016-09-04 MED ORDER — DEXAMETHASONE SODIUM PHOSPHATE 10 MG/ML IJ SOLN
INTRAMUSCULAR | Status: AC
  Filled 2016-09-04: qty 1

## 2016-09-04 NOTE — Progress Notes (Addendum)
[place] OFFICE PROGRESS NOTE   Diagnosis:  Small cell lung cancer  INTERVAL HISTORY:   Jenny [name] returns prior to scheduled follow-up. She completed cycle 4 irinotecan/cisplatin 08/28/2016. She received IV fluids 2 days earlier this week. She states that overall she does not feel well. She continues to have mild nausea. No vomiting. She noted improvement in the nausea after taking dexamethasone for 2 days earlier in the week. Her sister has noted some facial swelling which has resolved. She is intermittently confused. She has periodic headaches and back pain. She is currently taking OxyContin twice a day and takes oxycodone scheduled 3 times a day. She is having difficulty remembering what medications she took and when she took them. Bowels are moving. Appetite is poor. Family has noted some emotional lability.  Objective:  Vital signs in last 24 hours:  Pulse (!) 104. Blood pressure 91/67, temperature 98.9, respirations 21    HEENT: No thrush or ulcers. Mucous membranes are moist. Resp: Lungs clear bilaterally. Cardio: Regular rate and rhythm. GI: Abdomen soft and nontender. No hepatomegaly. Vascular: No leg edema. Neuro: Alert and oriented. Follows commands.    Lab Results:  Lab Results  Component Value Date   WBC 2.7 (L) 08/31/2016   HGB 9.1 (L) 08/31/2016   HCT 28.7 (L) 08/31/2016   MCV 85.7 08/31/2016   PLT 135 (L) 08/31/2016   NEUTROABS 2.4 08/31/2016    Imaging:  No results found.  Medications: I have reviewed the patient's current medications.  Assessment/Plan: 1.Limited stage small cell lung cancer, diagnosed on biopsy of a subcarinal lymph node and left lung mass by EBUSon 09/12/2015  PET scan 09/03/2015 confirmed a hypermetabolic left perihilar mass, 2 left lower lobe pulmonary masses, and mediastinal lymphadenopathy, no evidence of distant metastatic disease  Brain MRI negative for metastatic disease 09/13/2015  Status post chest  radiation 09/26/2015 through 10/18/2015  Cycle 1 cisplatin/etoposide beginning 09/26/2015  Cycle 2 cisplatin/etoposide beginning 10/16/2015  Cycle 3 carboplatin/etoposide beginning 11/06/2015  12/17/2015 MRI of the brain negative  12/17/2015 CT scans chest/abdomen/pelvis (report currently not available)  Bone scan 12/27/2015 with minimal increased uptake in the posterior aspect of the right 11th rib that does not appear abnormal on the CT images of August 2017; sclerotic focus noted on the previous CT scan in the body of L1 showed no abnormal uptake on the current bone scan; asymmetric increased uptake in the left SI joint region possibly corresponding to a subtle sclerotic focus in the medial posterior aspect of the left iliac bone  CT chest 04/07/2016-new liver metastases, new upper abdominal adenopathy, left iliac metastasis, new right posterior lateral Pleural/chest wall metastasis  Cycle 1 salvage chemotherapy with etoposide/carboplatin01/12/2016  Cycle 2 salvage chemotherapy with etoposide/carboplatin 06/09/2016-Neulasta added  Restaging CTs of the head, chest, abdomen, and pelvis on 07/06/2016-progressive skull metastases with intracranial/extracranial extension, stable lung nodules, progressive disease in the liver, bones, enlargement of a right chest wall mass, and enlarging peritoneal nodule  Cycle 1 irinotecan/cisplatin 07/13/2016  Cycle 2 irinotecan/cisplatin 07/27/2016  Cycle 3 irinotecan/cisplatin 08/10/2016  CT right arm 08/14/2016-metastatic lesion proximal right humeral shaft with a moth-eatenappearance. At risk for pathologic fracture. Metastatic lesion in the proximal right radial shaft with apparent extension into adjacent soft tissues.  CT chest 08/20/2016-overall stable disease, mixed response with a decrease in the right chest wall mass and a lingular nodule, slight increase in several of the liver lesions  Cycle 4 irinotecan/cisplatin 08/28/2016  2.  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 Weight loss secondary to #2  4. History of febrile neutropenia following chemotherapy  5. Remote history of a right leg DVT-2008?-Maintained on Lovenox  6. History of tobacco use  7. Admission to [facility] 10/27/2015 with neutropenic fever, esophagitis, and dehydration  8. Hypercalcemia 05/01/2016-status post intravenous fluids and Zometa12/29/2017  9. Fever prior to hospital admission 05/17/2016-infectious versus tumor fever; treated for pneumonia, respiratory panel positive for coronavirus  10. 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 Altered mental status-potentially related to infection/delirium, concern for brain metastases; brain CT 05/19/2016-no brain metastases, multiple skull metastases; improved 05/28/2016, 06/22/2016.  12. History of Leukopenia/thrombocytopenia secondary to chemotherapy-Neulasta added with cycle 2 etoposide/carboplatin  13. Tachycardia/hypotension-persistent, likely secondary to dehydration; improved 09/04/2016  14. Nausea, persistent following irinotecan/cisplatin 08/28/2016.   Disposition: Ms. Belasco appears unchanged. She has completed 4 cycles of irinotecan/cisplatin. She has persistent nausea. The nausea may be delayed nausea related to the chemotherapy. We also discussed that the nausea may be related to the pain medication. She noted improvement earlier in the week with dexamethasone. She is currently completing a liter of normal saline. She will receive dexamethasone 10 mg IV x1 in the office as well as Zofran 8 mg IV 1.  Overall her pain seems to be fairly well controlled. She will continue OxyContin every 12 hours. She will take the oxycodone only as needed rather than scheduled. Her family will administer all of her medications at home from this point  on.  She will follow-up as scheduled next week. She will contact the office in the interim with further problems.  Patient seen with Dr. Benay Spice.    Tanisia, Yokley ANP/GNP-BC   09/04/2016  10:54 AM  This was a shared visit with Ned Card.  We discussed the appropriate schedule for the pain medications with Ms. Tallman and her sister.  Julieanne Manson, MD

## 2016-09-04 NOTE — Patient Instructions (Signed)
Dehydration, Adult Dehydration is a condition in which there is not enough fluid or water in the body. This happens when you lose more fluids than you take in. Important organs, such as the kidneys, brain, and heart, cannot function without a proper amount of fluids. Any loss of fluids from the body can lead to dehydration. Dehydration can range from mild to severe. This condition should be treated right away to prevent it from becoming severe. What are the causes? This condition may be caused by:  Vomiting.  Diarrhea.  Excessive sweating, such as from heat exposure or exercise.  Not drinking enough fluid, especially:  When ill.  While doing activity that requires a lot of energy.  Excessive urination.  Fever.  Infection.  Certain medicines, such as medicines that cause the body to lose excess fluid (diuretics).  Inability to access safe drinking water.  Reduced physical ability to get adequate water and food. What increases the risk? This condition is more likely to develop in people:  Who have a poorly controlled long-term (chronic) illness, such as diabetes, heart disease, or kidney disease.  Who are age 65 or older.  Who are disabled.  Who live in a place with high altitude.  Who play endurance sports. What are the signs or symptoms? Symptoms of mild dehydration may include:   Thirst.  Dry lips.  Slightly dry mouth.  Dry, warm skin.  Dizziness. Symptoms of moderate dehydration may include:   Very dry mouth.  Muscle cramps.  Dark urine. Urine may be the color of tea.  Decreased urine production.  Decreased tear production.  Heartbeat that is irregular or faster than normal (palpitations).  Headache.  Light-headedness, especially when you stand up from a sitting position.  Fainting (syncope). Symptoms of severe dehydration may include:   Changes in skin, such as:  Cold and clammy skin.  Blotchy (mottled) or pale skin.  Skin that does  not quickly return to normal after being lightly pinched and released (poor skin turgor).  Changes in body fluids, such as:  Extreme thirst.  No tear production.  Inability to sweat when body temperature is high, such as in hot weather.  Very little urine production.  Changes in vital signs, such as:  Weak pulse.  Pulse that is more than 100 beats a minute when sitting still.  Rapid breathing.  Low blood pressure.  Other changes, such as:  Sunken eyes.  Cold hands and feet.  Confusion.  Lack of energy (lethargy).  Difficulty waking up from sleep.  Short-term weight loss.  Unconsciousness. How is this diagnosed? This condition is diagnosed based on your symptoms and a physical exam. Blood and urine tests may be done to help confirm the diagnosis. How is this treated? Treatment for this condition depends on the severity. Mild or moderate dehydration can often be treated at home. Treatment should be started right away. Do not wait until dehydration becomes severe. Severe dehydration is an emergency and it needs to be treated in a hospital. Treatment for mild dehydration may include:   Drinking more fluids.  Replacing salts and minerals in your blood (electrolytes) that you may have lost. Treatment for moderate dehydration may include:   Drinking an oral rehydration solution (ORS). This is a drink that helps you replace fluids and electrolytes (rehydrate). It can be found at pharmacies and retail stores. Treatment for severe dehydration may include:   Receiving fluids through an IV tube.  Receiving an electrolyte solution through a feeding tube that is   passed through your nose and into your stomach (nasogastric tube, or NG tube).  Correcting any abnormalities in electrolytes.  Treating the underlying cause of dehydration. Follow these instructions at home:  If directed by your health care provider, drink an ORS:  Make an ORS by following instructions on the  package.  Start by drinking small amounts, about  cup (120 mL) every 5-10 minutes.  Slowly increase how much you drink until you have taken the amount recommended by your health care provider.  Drink enough clear fluid to keep your urine clear or pale yellow. If you were told to drink an ORS, finish the ORS first, then start slowly drinking other clear fluids. Drink fluids such as:  Water. Do not drink only water. Doing that can lead to having too little salt (sodium) in the body (hyponatremia).  Ice chips.  Fruit juice that you have added water to (diluted fruit juice).  Low-calorie sports drinks.  Avoid:  Alcohol.  Drinks that contain a lot of sugar. These include high-calorie sports drinks, fruit juice that is not diluted, and soda.  Caffeine.  Foods that are greasy or contain a lot of fat or sugar.  Take over-the-counter and prescription medicines only as told by your health care provider.  Do not take sodium tablets. This can lead to having too much sodium in the body (hypernatremia).  Eat foods that contain a healthy balance of electrolytes, such as bananas, oranges, potatoes, tomatoes, and spinach.  Keep all follow-up visits as told by your health care provider. This is important. Contact a health care provider if:  You have abdominal pain that:  Gets worse.  Stays in one area (localizes).  You have a rash.  You have a stiff neck.  You are more irritable than usual.  You are sleepier or more difficult to wake up than usual.  You feel weak or dizzy.  You feel very thirsty.  You have urinated only a small amount of very dark urine over 6-8 hours. Get help right away if:  You have symptoms of severe dehydration.  You cannot drink fluids without vomiting.  Your symptoms get worse with treatment.  You have a fever.  You have a severe headache.  You have vomiting or diarrhea that:  Gets worse.  Does not go away.  You have blood or green matter  (bile) in your vomit.  You have blood in your stool. This may cause stool to look black and tarry.  You have not urinated in 6-8 hours.  You faint.  Your heart rate while sitting still is over 100 beats a minute.  You have trouble breathing. This information is not intended to replace advice given to you by your health care provider. Make sure you discuss any questions you have with your health care provider. Document Released: 04/20/2005 Document Revised: 11/15/2015 Document Reviewed: 06/14/2015 Elsevier Interactive Patient Education  2017 [employer]  

## 2016-09-11 ENCOUNTER — Other Ambulatory Visit (HOSPITAL_BASED_OUTPATIENT_CLINIC_OR_DEPARTMENT_OTHER): Payer: BLUE CROSS/BLUE SHIELD

## 2016-09-11 ENCOUNTER — Ambulatory Visit (HOSPITAL_BASED_OUTPATIENT_CLINIC_OR_DEPARTMENT_OTHER): Payer: BLUE CROSS/BLUE SHIELD

## 2016-09-11 ENCOUNTER — Ambulatory Visit (HOSPITAL_BASED_OUTPATIENT_CLINIC_OR_DEPARTMENT_OTHER): Payer: BLUE CROSS/BLUE SHIELD | Admitting: Oncology

## 2016-09-11 ENCOUNTER — Other Ambulatory Visit: Payer: Self-pay | Admitting: *Deleted

## 2016-09-11 VITALS — BP 92/67 | HR 158 | Temp 97.8°F | Resp 17 | Ht 60.0 in | Wt 87.6 lb

## 2016-09-11 VITALS — BP 95/59 | HR 79 | Temp 98.2°F

## 2016-09-11 DIAGNOSIS — C7951 Secondary malignant neoplasm of bone: Secondary | ICD-10-CM

## 2016-09-11 DIAGNOSIS — C3492 Malignant neoplasm of unspecified part of left bronchus or lung: Secondary | ICD-10-CM

## 2016-09-11 DIAGNOSIS — C3490 Malignant neoplasm of unspecified part of unspecified bronchus or lung: Secondary | ICD-10-CM

## 2016-09-11 DIAGNOSIS — R197 Diarrhea, unspecified: Secondary | ICD-10-CM | POA: Diagnosis not present

## 2016-09-11 DIAGNOSIS — D630 Anemia in neoplastic disease: Secondary | ICD-10-CM

## 2016-09-11 DIAGNOSIS — R000 Tachycardia, unspecified: Secondary | ICD-10-CM | POA: Diagnosis not present

## 2016-09-11 DIAGNOSIS — G893 Neoplasm related pain (acute) (chronic): Secondary | ICD-10-CM

## 2016-09-11 DIAGNOSIS — Z5111 Encounter for antineoplastic chemotherapy: Secondary | ICD-10-CM | POA: Diagnosis not present

## 2016-09-11 LAB — CBC WITH DIFFERENTIAL/PLATELET
BASO%: 0.2 % (ref 0.0–2.0)
BASOS ABS: 0 10*3/uL (ref 0.0–0.1)
EOS%: 0.5 % (ref 0.0–7.0)
Eosinophils Absolute: 0 10*3/uL (ref 0.0–0.5)
HEMATOCRIT: 29.4 % — AB (ref 34.8–46.6)
HGB: 8.9 g/dL — ABNORMAL LOW (ref 11.6–15.9)
LYMPH%: 11.5 % — AB (ref 14.0–49.7)
MCH: 27.1 pg (ref 25.1–34.0)
MCHC: 30.3 g/dL — ABNORMAL LOW (ref 31.5–36.0)
MCV: 89.4 fL (ref 79.5–101.0)
MONO#: 0.7 10*3/uL (ref 0.1–0.9)
MONO%: 16.5 % — ABNORMAL HIGH (ref 0.0–14.0)
NEUT#: 3 10*3/uL (ref 1.5–6.5)
NEUT%: 71.3 % (ref 38.4–76.8)
PLATELETS: 148 10*3/uL (ref 145–400)
RBC: 3.29 10*6/uL — AB (ref 3.70–5.45)
RDW: 17.9 % — ABNORMAL HIGH (ref 11.2–14.5)
WBC: 4.3 10*3/uL (ref 3.9–10.3)
lymph#: 0.5 10*3/uL — ABNORMAL LOW (ref 0.9–3.3)

## 2016-09-11 LAB — COMPREHENSIVE METABOLIC PANEL
ALK PHOS: 138 U/L (ref 40–150)
ALT: 10 U/L (ref 0–55)
ANION GAP: 13 meq/L — AB (ref 3–11)
AST: 15 U/L (ref 5–34)
Albumin: 2.9 g/dL — ABNORMAL LOW (ref 3.5–5.0)
BILIRUBIN TOTAL: 0.28 mg/dL (ref 0.20–1.20)
BUN: 18.3 mg/dL (ref 7.0–26.0)
CALCIUM: 9.8 mg/dL (ref 8.4–10.4)
CO2: 23 mEq/L (ref 22–29)
Chloride: 100 mEq/L (ref 98–109)
Creatinine: 0.8 mg/dL (ref 0.6–1.1)
EGFR: 78 mL/min/{1.73_m2} — ABNORMAL LOW (ref >90)
Glucose: 113 mg/dl (ref 70–140)
Potassium: 4.7 mEq/L (ref 3.5–5.1)
Sodium: 136 mEq/L (ref 136–145)
Total Protein: 7 g/dL (ref 6.4–8.3)

## 2016-09-11 LAB — MAGNESIUM: Magnesium: 1.5 mg/dl (ref 1.5–2.5)

## 2016-09-11 MED ORDER — OXYCODONE HCL 5 MG PO TABS: 10.0000 mg | ORAL_TABLET | ORAL | 0 refills | Status: DC | PRN

## 2016-09-11 MED ORDER — OXYCODONE-ACETAMINOPHEN 5-325 MG PO TABS
2.0000 | ORAL_TABLET | Freq: Once | ORAL | Status: AC
Start: 2016-09-11 — End: 2016-09-11
  Administered 2016-09-11: 2 via ORAL

## 2016-09-11 MED ORDER — SODIUM CHLORIDE 0.9 % IV SOLN
Freq: Once | INTRAVENOUS | Status: AC
  Administered 2016-09-11: 13:00:00 via INTRAVENOUS
  Filled 2016-09-11: qty 5

## 2016-09-11 MED ORDER — IRINOTECAN HCL CHEMO INJECTION 100 MG/5ML
65.0000 mg/m2 | Freq: Once | INTRAVENOUS | Status: AC
  Administered 2016-09-11: 80 mg via INTRAVENOUS
  Filled 2016-09-11: qty 4

## 2016-09-11 MED ORDER — PALONOSETRON HCL INJECTION 0.25 MG/5ML
0.2500 mg | Freq: Once | INTRAVENOUS | Status: AC
Start: 2016-09-11 — End: 2016-09-11
  Administered 2016-09-11: 0.25 mg via INTRAVENOUS

## 2016-09-11 MED ORDER — SODIUM CHLORIDE 0.9 % IV SOLN
Freq: Once | INTRAVENOUS | Status: AC
  Administered 2016-09-11: 10:00:00 via INTRAVENOUS

## 2016-09-11 MED ORDER — OXYCODONE HCL ER 20 MG PO T12A: 20.0000 mg | EXTENDED_RELEASE_TABLET | Freq: Two times a day (BID) | ORAL | 0 refills | Status: DC

## 2016-09-11 MED ORDER — SODIUM CHLORIDE 0.9 % IV SOLN
30.0000 mg/m2 | Freq: Once | INTRAVENOUS | Status: AC
  Administered 2016-09-11: 41 mg via INTRAVENOUS
  Filled 2016-09-11: qty 41

## 2016-09-11 MED ORDER — SODIUM CHLORIDE 0.9 % IV SOLN
Freq: Once | INTRAVENOUS | Status: AC
  Administered 2016-09-11: 13:00:00 via INTRAVENOUS

## 2016-09-11 MED ORDER — ATROPINE SULFATE 1 MG/ML IJ SOLN
0.5000 mg | Freq: Once | INTRAMUSCULAR | Status: AC | PRN
  Administered 2016-09-11: 0.5 mg via INTRAVENOUS

## 2016-09-11 MED ORDER — POTASSIUM CHLORIDE 2 MEQ/ML IV SOLN
Freq: Once | INTRAVENOUS | Status: AC
  Administered 2016-09-11: 11:00:00 via INTRAVENOUS
  Filled 2016-09-11: qty 10

## 2016-09-11 NOTE — Progress Notes (Signed)
[place] OFFICE PROGRESS NOTE   Diagnosis: Small cell lung cancer  INTERVAL HISTORY:   Jenny [name] returns as scheduled. She continues to have pain and reports adequate relief with OxyContin and oxycodone. She had 2-3 days of diarrhea this week. No diarrhea for the past 2 days. The diarrhea improved with Imodium and Lomotil.  Objective:  Vital signs in last 24 hours:  Blood pressure 92/67, pulse (!) 158, temperature 97.8 F (36.6 C), temperature source Oral, resp. rate 17, height 5' (1.524 m), weight 87 lb 9.6 oz (39.7 kg), SpO2 99 %.    HEENT: No thrush or ulcers Resp: Decreased breath sounds at the right compared to the left posterior chest, no respiratory distress Cardio: Regular rate and rhythm, tachycardia GI: No hepatosplenomegaly, no mass, nontender Vascular: No leg edema Neuro: Alert and oriented  Skin: Multiple scalp masses    Lab Results:  Lab Results  Component Value Date   WBC 2.7 (L) 08/31/2016   HGB 9.1 (L) 08/31/2016   HCT 28.7 (L) 08/31/2016   MCV 85.7 08/31/2016   PLT 135 (L) 08/31/2016   NEUTROABS 2.4 08/31/2016    CMP     Component Value Date/Time   NA 134 (L) 09/04/2016 1045   K 4.0 09/04/2016 1045   CL 106 05/20/2016 0540   CO2 24 09/04/2016 1045   GLUCOSE 104 09/04/2016 1045   BUN 10.8 09/04/2016 1045   CREATININE 0.6 09/04/2016 1045   CALCIUM 8.3 (L) 09/04/2016 1045   PROT 5.4 (L) 09/04/2016 1045   ALBUMIN 2.2 (L) 09/04/2016 1045   AST 13 09/04/2016 1045   ALT 6 09/04/2016 1045   ALKPHOS 103 09/04/2016 1045   BILITOT 0.29 09/04/2016 1045   GFRNONAA >60 05/20/2016 0540   GFRAA >60 05/20/2016 0540     Medications: I have reviewed the patient's current medications.  Assessment/Plan: 1.Limited stage small cell lung cancer, diagnosed on biopsy of a subcarinal lymph node and left lung mass by EBUSon 09/12/2015  PET scan 09/03/2015 confirmed a hypermetabolic left perihilar mass, 2 left lower lobe pulmonary masses,  and mediastinal lymphadenopathy, no evidence of distant metastatic disease  Brain MRI negative for metastatic disease 09/13/2015  Status post chest radiation 09/26/2015 through 10/18/2015  Cycle 1 cisplatin/etoposide beginning 09/26/2015  Cycle 2 cisplatin/etoposide beginning 10/16/2015  Cycle 3 carboplatin/etoposide beginning 11/06/2015  12/17/2015 MRI of the brain negative  12/17/2015 CT scans chest/abdomen/pelvis (report currently not available)  Bone scan 12/27/2015 with minimal increased uptake in the posterior aspect of the right 11th rib that does not appear abnormal on the CT images of August 2017; sclerotic focus noted on the previous CT scan in the body of L1 showed no abnormal uptake on the current bone scan; asymmetric increased uptake in the left SI joint region possibly corresponding to a subtle sclerotic focus in the medial posterior aspect of the left iliac bone  CT chest 04/07/2016-new liver metastases, new upper abdominal adenopathy, left iliac metastasis, new right posterior lateral Pleural/chest wall metastasis  Cycle 1 salvage chemotherapy with etoposide/carboplatin01/12/2016  Cycle 2 salvage chemotherapy with etoposide/carboplatin 06/09/2016-Neulasta added  Restaging CTs of the head, chest, abdomen, and pelvis on 07/06/2016-progressive skull metastases with intracranial/extracranial extension, stable lung nodules, progressive disease in the liver, bones, enlargement of a right chest wall mass, and enlarging peritoneal nodule  Cycle 1 irinotecan/cisplatin 07/13/2016  Cycle 2 irinotecan/cisplatin 07/27/2016  Cycle 3 irinotecan/cisplatin 08/10/2016  CT right arm 08/14/2016-metastatic lesion proximal right humeral shaft with a moth-eatenappearance. At risk for pathologic fracture.  Metastatic lesion in the proximal right radial shaft with apparent extension into adjacent soft tissues.  CT chest 08/20/2016-overall stable disease, mixed response with a decrease in  the right chest wall mass and a lingular nodule, slight increase in several of the liver lesions  Cycle 4 irinotecan/cisplatin 08/28/2016  Cycle 5 irinotecan/cisplatin 09/11/2016  2. 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 Weight loss secondary to #2  4. History of febrile neutropenia following chemotherapy  5. Remote history of a right leg DVT-2008?-Maintained on Lovenox  6. History of tobacco use  7. Admission to [facility] 10/27/2015 with neutropenic fever, esophagitis, and dehydration  8. Hypercalcemia 05/01/2016-status post intravenous fluids and Zometa12/29/2017  9. Fever prior to hospital admission 05/17/2016-infectious versus tumor fever; treated for pneumonia, respiratory panel positive for coronavirus  10. 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 Altered mental status-potentially related to infection/delirium, concern for brain metastases; brain CT 05/19/2016-no brain metastases, multiple skull metastases; improved 05/28/2016, 06/22/2016.  12. History of Leukopenia/thrombocytopenia secondary to chemotherapy-Neulasta added with cycle 2 etoposide/carboplatin  13. Tachycardia/hypotension-persistent, likely secondary to dehydration; improved 09/04/2016  14. Nausea, persistent following irinotecan/cisplatin 08/28/2016.    Disposition:  Jenny [name] appears unchanged. She has persistent tachycardia. This is likely related to dehydration. The tachycardia has responded to IV fluids in the past. She will receive intravenous fluids prior to chemotherapy today. She will use Imodium and Lomotil for diarrhea following chemotherapy.  The plan is to proceed with another cycle of irinotecan/cisplatin today. She will return for IV fluids on 09/14/2016. She will be scheduled for an office and lab visit on 09/18/2016.  25 minutes  were spent with the patient today. The majority of the time was used for counseling and coordination of care.  Betsy Coder, MD  09/11/2016  8:11 AM

## 2016-09-11 NOTE — Patient Instructions (Signed)
[place] Discharge Instructions for Patients Receiving Chemotherapy  Today you received the following chemotherapy agents:  Irinotecan and Cisplatin  To help prevent nausea and vomiting after your treatment, we encourage you to take your nausea medication as ordered per MD.   If you develop nausea and vomiting that is not controlled by your nausea medication, call the clinic.   BELOW ARE SYMPTOMS THAT SHOULD BE REPORTED IMMEDIATELY:  *FEVER GREATER THAN 100.5 F  *CHILLS WITH OR WITHOUT FEVER  NAUSEA AND VOMITING THAT IS NOT CONTROLLED WITH YOUR NAUSEA MEDICATION  *UNUSUAL SHORTNESS OF BREATH  *UNUSUAL BRUISING OR BLEEDING  TENDERNESS IN MOUTH AND THROAT WITH OR WITHOUT PRESENCE OF ULCERS  *URINARY PROBLEMS  *BOWEL PROBLEMS  UNUSUAL RASH Items with * indicate a potential emergency and should be followed up as soon as possible.  Feel free to call the clinic you have any questions or concerns. The clinic phone number is [phone].  Please show the [place] at check-in to the Emergency Department and triage nurse.

## 2016-09-11 NOTE — Progress Notes (Signed)
OK to run hydration fluids with last 15 minutes of Cisplatin per L. Marcello Moores NP.

## 2016-09-14 ENCOUNTER — Ambulatory Visit (HOSPITAL_BASED_OUTPATIENT_CLINIC_OR_DEPARTMENT_OTHER): Payer: BLUE CROSS/BLUE SHIELD

## 2016-09-14 VITALS — BP 94/55 | HR 107 | Temp 98.1°F | Resp 18 | Ht 60.0 in

## 2016-09-14 DIAGNOSIS — E860 Dehydration: Secondary | ICD-10-CM | POA: Diagnosis not present

## 2016-09-14 DIAGNOSIS — R110 Nausea: Secondary | ICD-10-CM

## 2016-09-14 MED ORDER — ONDANSETRON HCL 4 MG/2ML IJ SOLN
INTRAMUSCULAR | Status: AC
  Filled 2016-09-14: qty 4

## 2016-09-14 MED ORDER — DEXAMETHASONE SODIUM PHOSPHATE 10 MG/ML IJ SOLN
INTRAMUSCULAR | Status: AC
  Filled 2016-09-14: qty 1

## 2016-09-14 MED ORDER — DEXAMETHASONE SODIUM PHOSPHATE 10 MG/ML IJ SOLN: 10.0000 mg | Freq: Once | INTRAMUSCULAR | Status: DC

## 2016-09-14 MED ORDER — SODIUM CHLORIDE 0.9 % IV SOLN
10.0000 mg | Freq: Once | INTRAVENOUS | Status: DC
  Administered 2016-09-14: 10 mg via INTRAVENOUS

## 2016-09-14 MED ORDER — SODIUM CHLORIDE 0.9 % IV SOLN
1000.0000 mL | Freq: Once | INTRAVENOUS | Status: AC
  Administered 2016-09-14: 14:00:00 via INTRAVENOUS

## 2016-09-14 MED ORDER — SODIUM CHLORIDE 0.9 % IV SOLN: Freq: Once | INTRAVENOUS | Status: DC

## 2016-09-14 MED ORDER — ONDANSETRON HCL 4 MG/2ML IJ SOLN
8.0000 mg | Freq: Once | INTRAMUSCULAR | Status: AC
Start: 2016-09-14 — End: 2016-09-14
  Administered 2016-09-14: 8 mg via INTRAVENOUS

## 2016-09-14 NOTE — Patient Instructions (Signed)
Dehydration, Adult Dehydration is a condition in which there is not enough fluid or water in the body. This happens when you lose more fluids than you take in. Important organs, such as the kidneys, brain, and heart, cannot function without a proper amount of fluids. Any loss of fluids from the body can lead to dehydration. Dehydration can range from mild to severe. This condition should be treated right away to prevent it from becoming severe. What are the causes? This condition may be caused by:  Vomiting.  Diarrhea.  Excessive sweating, such as from heat exposure or exercise.  Not drinking enough fluid, especially:  When ill.  While doing activity that requires a lot of energy.  Excessive urination.  Fever.  Infection.  Certain medicines, such as medicines that cause the body to lose excess fluid (diuretics).  Inability to access safe drinking water.  Reduced physical ability to get adequate water and food. What increases the risk? This condition is more likely to develop in people:  Who have a poorly controlled long-term (chronic) illness, such as diabetes, heart disease, or kidney disease.  Who are age 65 or older.  Who are disabled.  Who live in a place with high altitude.  Who play endurance sports. What are the signs or symptoms? Symptoms of mild dehydration may include:   Thirst.  Dry lips.  Slightly dry mouth.  Dry, warm skin.  Dizziness. Symptoms of moderate dehydration may include:   Very dry mouth.  Muscle cramps.  Dark urine. Urine may be the color of tea.  Decreased urine production.  Decreased tear production.  Heartbeat that is irregular or faster than normal (palpitations).  Headache.  Light-headedness, especially when you stand up from a sitting position.  Fainting (syncope). Symptoms of severe dehydration may include:   Changes in skin, such as:  Cold and clammy skin.  Blotchy (mottled) or pale skin.  Skin that does  not quickly return to normal after being lightly pinched and released (poor skin turgor).  Changes in body fluids, such as:  Extreme thirst.  No tear production.  Inability to sweat when body temperature is high, such as in hot weather.  Very little urine production.  Changes in vital signs, such as:  Weak pulse.  Pulse that is more than 100 beats a minute when sitting still.  Rapid breathing.  Low blood pressure.  Other changes, such as:  Sunken eyes.  Cold hands and feet.  Confusion.  Lack of energy (lethargy).  Difficulty waking up from sleep.  Short-term weight loss.  Unconsciousness. How is this diagnosed? This condition is diagnosed based on your symptoms and a physical exam. Blood and urine tests may be done to help confirm the diagnosis. How is this treated? Treatment for this condition depends on the severity. Mild or moderate dehydration can often be treated at home. Treatment should be started right away. Do not wait until dehydration becomes severe. Severe dehydration is an emergency and it needs to be treated in a hospital. Treatment for mild dehydration may include:   Drinking more fluids.  Replacing salts and minerals in your blood (electrolytes) that you may have lost. Treatment for moderate dehydration may include:   Drinking an oral rehydration solution (ORS). This is a drink that helps you replace fluids and electrolytes (rehydrate). It can be found at pharmacies and retail stores. Treatment for severe dehydration may include:   Receiving fluids through an IV tube.  Receiving an electrolyte solution through a feeding tube that is   passed through your nose and into your stomach (nasogastric tube, or NG tube).  Correcting any abnormalities in electrolytes.  Treating the underlying cause of dehydration. Follow these instructions at home:  If directed by your health care provider, drink an ORS:  Make an ORS by following instructions on the  package.  Start by drinking small amounts, about  cup (120 mL) every 5-10 minutes.  Slowly increase how much you drink until you have taken the amount recommended by your health care provider.  Drink enough clear fluid to keep your urine clear or pale yellow. If you were told to drink an ORS, finish the ORS first, then start slowly drinking other clear fluids. Drink fluids such as:  Water. Do not drink only water. Doing that can lead to having too little salt (sodium) in the body (hyponatremia).  Ice chips.  Fruit juice that you have added water to (diluted fruit juice).  Low-calorie sports drinks.  Avoid:  Alcohol.  Drinks that contain a lot of sugar. These include high-calorie sports drinks, fruit juice that is not diluted, and soda.  Caffeine.  Foods that are greasy or contain a lot of fat or sugar.  Take over-the-counter and prescription medicines only as told by your health care provider.  Do not take sodium tablets. This can lead to having too much sodium in the body (hypernatremia).  Eat foods that contain a healthy balance of electrolytes, such as bananas, oranges, potatoes, tomatoes, and spinach.  Keep all follow-up visits as told by your health care provider. This is important. Contact a health care provider if:  You have abdominal pain that:  Gets worse.  Stays in one area (localizes).  You have a rash.  You have a stiff neck.  You are more irritable than usual.  You are sleepier or more difficult to wake up than usual.  You feel weak or dizzy.  You feel very thirsty.  You have urinated only a small amount of very dark urine over 6-8 hours. Get help right away if:  You have symptoms of severe dehydration.  You cannot drink fluids without vomiting.  Your symptoms get worse with treatment.  You have a fever.  You have a severe headache.  You have vomiting or diarrhea that:  Gets worse.  Does not go away.  You have blood or green matter  (bile) in your vomit.  You have blood in your stool. This may cause stool to look black and tarry.  You have not urinated in 6-8 hours.  You faint.  Your heart rate while sitting still is over 100 beats a minute.  You have trouble breathing. This information is not intended to replace advice given to you by your health care provider. Make sure you discuss any questions you have with your health care provider. Document Released: 04/20/2005 Document Revised: 11/15/2015 Document Reviewed: 06/14/2015 Elsevier Interactive Patient Education  2017 [employer]  

## 2016-09-16 ENCOUNTER — Other Ambulatory Visit: Payer: Self-pay | Admitting: *Deleted

## 2016-09-16 ENCOUNTER — Telehealth: Payer: Self-pay | Admitting: *Deleted

## 2016-09-16 DIAGNOSIS — C3490 Malignant neoplasm of unspecified part of unspecified bronchus or lung: Secondary | ICD-10-CM

## 2016-09-16 MED ORDER — OXYCODONE HCL 5 MG PO TABS: 10.0000 mg | ORAL_TABLET | ORAL | 0 refills | Status: DC | PRN

## 2016-09-16 NOTE — Telephone Encounter (Signed)
Patient's brother called requesting refill for Oxy IR.  Return number when ready for pick up is "[phone].  please call so I can arrive early.  She takes three pills every four hours around the clock.  She does not have enough to last until Friday.  The plan is to discuss changes to her pain medicine which I'm sure she will need.  In the mean time she needs a refill is why I'm calling today."

## 2016-09-18 ENCOUNTER — Ambulatory Visit (HOSPITAL_BASED_OUTPATIENT_CLINIC_OR_DEPARTMENT_OTHER): Payer: BLUE CROSS/BLUE SHIELD

## 2016-09-18 ENCOUNTER — Ambulatory Visit (HOSPITAL_BASED_OUTPATIENT_CLINIC_OR_DEPARTMENT_OTHER): Payer: BLUE CROSS/BLUE SHIELD | Admitting: Nurse Practitioner

## 2016-09-18 ENCOUNTER — Telehealth: Payer: Self-pay | Admitting: Oncology

## 2016-09-18 DIAGNOSIS — D630 Anemia in neoplastic disease: Secondary | ICD-10-CM

## 2016-09-18 DIAGNOSIS — C3492 Malignant neoplasm of unspecified part of left bronchus or lung: Secondary | ICD-10-CM

## 2016-09-18 DIAGNOSIS — R112 Nausea with vomiting, unspecified: Secondary | ICD-10-CM

## 2016-09-18 DIAGNOSIS — C7951 Secondary malignant neoplasm of bone: Secondary | ICD-10-CM

## 2016-09-18 DIAGNOSIS — C3490 Malignant neoplasm of unspecified part of unspecified bronchus or lung: Secondary | ICD-10-CM

## 2016-09-18 DIAGNOSIS — G893 Neoplasm related pain (acute) (chronic): Secondary | ICD-10-CM | POA: Diagnosis not present

## 2016-09-18 DIAGNOSIS — D6481 Anemia due to antineoplastic chemotherapy: Secondary | ICD-10-CM | POA: Diagnosis not present

## 2016-09-18 MED ORDER — OXYCODONE-ACETAMINOPHEN 5-325 MG PO TABS
ORAL_TABLET | ORAL | Status: AC
  Filled 2016-09-18: qty 2

## 2016-09-18 MED ORDER — OXYCODONE HCL 5 MG PO TABS: 10.0000 mg | ORAL_TABLET | ORAL | 0 refills | Status: DC | PRN

## 2016-09-18 MED ORDER — SODIUM CHLORIDE 0.9 % IV SOLN
INTRAVENOUS | Status: AC
  Administered 2016-09-18: 13:00:00 via INTRAVENOUS

## 2016-09-18 MED ORDER — OXYCODONE-ACETAMINOPHEN 5-325 MG PO TABS: 2.0000 | ORAL_TABLET | Freq: Once | ORAL | Status: DC

## 2016-09-18 MED ORDER — DEXAMETHASONE SODIUM PHOSPHATE 10 MG/ML IJ SOLN
10.0000 mg | Freq: Once | INTRAMUSCULAR | Status: AC
  Administered 2016-09-18: 10 mg via INTRAVENOUS

## 2016-09-18 MED ORDER — SODIUM CHLORIDE 0.9 % IV SOLN: 10.0000 mg | Freq: Once | INTRAVENOUS | Status: DC

## 2016-09-18 MED ORDER — OXYCODONE-ACETAMINOPHEN 5-325 MG PO TABS
2.0000 | ORAL_TABLET | Freq: Once | ORAL | Status: AC
  Administered 2016-09-18: 2 via ORAL

## 2016-09-18 MED ORDER — OXYCODONE HCL ER 40 MG PO T12A: 40.0000 mg | EXTENDED_RELEASE_TABLET | Freq: Two times a day (BID) | ORAL | 0 refills | Status: DC

## 2016-09-18 NOTE — Telephone Encounter (Signed)
Appointments scheduled per 09/11/16 los. Patient given a copy of the appointment schedule, per 09/11/16 los.

## 2016-09-18 NOTE — Progress Notes (Addendum)
[place] OFFICE PROGRESS NOTE   Diagnosis:  Small cell lung cancer  INTERVAL HISTORY:   Jenny [name] returns as scheduled. She completed another cycle of irinotecan/cisplatin 09/11/2016. She is having persistent nausea. She has had 2 episodes of vomiting. Compazine and Zofran are intermittently affected. No mouth sores. No diarrhea. She has pain at multiple locations. She is taking OxyContin 20 mg twice daily with oxycodone 10-15 mg 3-4 times a day.  Objective:  Vital signs in last 24 hours:  Blood pressure (!) 81/53, pulse (!) 138, temperature 98.5 F (36.9 C), temperature source Oral, resp. rate 17, height 5' (1.524 m), SpO2 100 %.    HEENT: No thrush or ulcers. Multiple scalp masses. Resp: Lungs are clear with diminished breath sounds at the right lower lung field. No respiratory distress. Cardio: Regular, tachycardic. GI: Soft, nontender. Vascular: No leg edema. Musculoskeletal: Palpable mass right mid to low back.    Lab Results:  Lab Results  Component Value Date   WBC 4.3 09/11/2016   HGB 8.9 (L) 09/11/2016   HCT 29.4 (L) 09/11/2016   MCV 89.4 09/11/2016   PLT 148 09/11/2016   NEUTROABS 3.0 09/11/2016    Imaging:  No results found.  Medications: I have reviewed the patient's current medications.  Assessment/Plan: 1.Limited stage small cell lung cancer, diagnosed on biopsy of a subcarinal lymph node and left lung mass by EBUSon 09/12/2015  PET scan 09/03/2015 confirmed a hypermetabolic left perihilar mass, 2 left lower lobe pulmonary masses, and mediastinal lymphadenopathy, no evidence of distant metastatic disease  Brain MRI negative for metastatic disease 09/13/2015  Status post chest radiation 09/26/2015 through 10/18/2015  Cycle 1 cisplatin/etoposide beginning 09/26/2015  Cycle 2 cisplatin/etoposide beginning 10/16/2015  Cycle 3 carboplatin/etoposide beginning 11/06/2015  12/17/2015 MRI of the brain negative  12/17/2015 CT  scans chest/abdomen/pelvis (report currently not available)  Bone scan 12/27/2015 with minimal increased uptake in the posterior aspect of the right 11th rib that does not appear abnormal on the CT images of August 2017; sclerotic focus noted on the previous CT scan in the body of L1 showed no abnormal uptake on the current bone scan; asymmetric increased uptake in the left SI joint region possibly corresponding to a subtle sclerotic focus in the medial posterior aspect of the left iliac bone  CT chest 04/07/2016-new liver metastases, new upper abdominal adenopathy, left iliac metastasis, new right posterior lateral Pleural/chest wall metastasis  Cycle 1 salvage chemotherapy with etoposide/carboplatin01/12/2016  Cycle 2 salvage chemotherapy with etoposide/carboplatin 06/09/2016-Neulasta added  Restaging CTs of the head, chest, abdomen, and pelvis on 07/06/2016-progressive skull metastases with intracranial/extracranial extension, stable lung nodules, progressive disease in the liver, bones, enlargement of a right chest wall mass, and enlarging peritoneal nodule  Cycle 1 irinotecan/cisplatin 07/13/2016  Cycle 2 irinotecan/cisplatin 07/27/2016  Cycle 3 irinotecan/cisplatin 08/10/2016  CT right arm 08/14/2016-metastatic lesion proximal right humeral shaft with a moth-eatenappearance. At risk for pathologic fracture. Metastatic lesion in the proximal right radial shaft with apparent extension into adjacent soft tissues.  CT chest 08/20/2016-overall stable disease, mixed response with a decrease in the right chest wall mass and a lingular nodule, slight increase in several of the liver lesions  Cycle 4 irinotecan/cisplatin 08/28/2016  Cycle 5 irinotecan/cisplatin 09/11/2016  2. 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 Weight loss secondary to #2  4. History of febrile neutropenia following chemotherapy  5. Remote history  of a right leg DVT-2008?-Maintained on Lovenox  6. History of tobacco use  7. Admission to [facility] 10/27/2015 with neutropenic fever, esophagitis, and dehydration  8. Hypercalcemia 05/01/2016-status post intravenous fluids and Zometa12/29/2017  9. Fever prior to hospital admission 05/17/2016-infectious versus tumor fever; treated for pneumonia, respiratory panel positive for coronavirus  10. 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 Altered mental status-potentially related to infection/delirium, concern for brain metastases; brain CT 05/19/2016-no brain metastases, multiple skull metastases; improved 05/28/2016, 06/22/2016.  12. History of Leukopenia/thrombocytopenia secondary to chemotherapy-Neulasta added with cycle 2 etoposide/carboplatin  13. Tachycardia/hypotension-persistent, likely secondary to dehydration;improved 09/04/2016    Disposition: Jenny [name] appears unchanged. She completed cycle 5 irinotecan/cisplatin 09/11/2016. She has persistent nausea with intermittent vomiting. This is likely delayed nausea related to the chemotherapy. She will receive a liter of normal saline while in the office today and dexamethasone 10 mg IV.  We adjusted the pain regimen by increasing OxyContin from 20 mg twice daily to 40 mg twice daily. She will continue oxycodone as needed for breakthrough pain.  She will return for a follow-up visit and the next cycle of chemotherapy in one week. She will contact the office in the interim with any problems.  Patient seen with Dr. Benay Spice.      Derinda, Bartus ANP/GNP-BC   09/18/2016  11:22 AM This was a shared visit with Ned Card. Ms. Krasner has delayed nausea following the chemotherapy last week. She will receive intravenous fluids and Decadron today. She is taking frequent oxycodone for pain. We increase the OxyContin dose today. She will continue  oxycodone for breakthrough pain.  Julieanne Manson, M.D.

## 2016-09-18 NOTE — Patient Instructions (Signed)
Dehydration, Adult Dehydration is a condition in which there is not enough fluid or water in the body. This happens when you lose more fluids than you take in. Important organs, such as the kidneys, brain, and heart, cannot function without a proper amount of fluids. Any loss of fluids from the body can lead to dehydration. Dehydration can range from mild to severe. This condition should be treated right away to prevent it from becoming severe. What are the causes? This condition may be caused by:  Vomiting.  Diarrhea.  Excessive sweating, such as from heat exposure or exercise.  Not drinking enough fluid, especially:  When ill.  While doing activity that requires a lot of energy.  Excessive urination.  Fever.  Infection.  Certain medicines, such as medicines that cause the body to lose excess fluid (diuretics).  Inability to access safe drinking water.  Reduced physical ability to get adequate water and food. What increases the risk? This condition is more likely to develop in people:  Who have a poorly controlled long-term (chronic) illness, such as diabetes, heart disease, or kidney disease.  Who are age 65 or older.  Who are disabled.  Who live in a place with high altitude.  Who play endurance sports. What are the signs or symptoms? Symptoms of mild dehydration may include:   Thirst.  Dry lips.  Slightly dry mouth.  Dry, warm skin.  Dizziness. Symptoms of moderate dehydration may include:   Very dry mouth.  Muscle cramps.  Dark urine. Urine may be the color of tea.  Decreased urine production.  Decreased tear production.  Heartbeat that is irregular or faster than normal (palpitations).  Headache.  Light-headedness, especially when you stand up from a sitting position.  Fainting (syncope). Symptoms of severe dehydration may include:   Changes in skin, such as:  Cold and clammy skin.  Blotchy (mottled) or pale skin.  Skin that does  not quickly return to normal after being lightly pinched and released (poor skin turgor).  Changes in body fluids, such as:  Extreme thirst.  No tear production.  Inability to sweat when body temperature is high, such as in hot weather.  Very little urine production.  Changes in vital signs, such as:  Weak pulse.  Pulse that is more than 100 beats a minute when sitting still.  Rapid breathing.  Low blood pressure.  Other changes, such as:  Sunken eyes.  Cold hands and feet.  Confusion.  Lack of energy (lethargy).  Difficulty waking up from sleep.  Short-term weight loss.  Unconsciousness. How is this diagnosed? This condition is diagnosed based on your symptoms and a physical exam. Blood and urine tests may be done to help confirm the diagnosis. How is this treated? Treatment for this condition depends on the severity. Mild or moderate dehydration can often be treated at home. Treatment should be started right away. Do not wait until dehydration becomes severe. Severe dehydration is an emergency and it needs to be treated in a hospital. Treatment for mild dehydration may include:   Drinking more fluids.  Replacing salts and minerals in your blood (electrolytes) that you may have lost. Treatment for moderate dehydration may include:   Drinking an oral rehydration solution (ORS). This is a drink that helps you replace fluids and electrolytes (rehydrate). It can be found at pharmacies and retail stores. Treatment for severe dehydration may include:   Receiving fluids through an IV tube.  Receiving an electrolyte solution through a feeding tube that is   passed through your nose and into your stomach (nasogastric tube, or NG tube).  Correcting any abnormalities in electrolytes.  Treating the underlying cause of dehydration. Follow these instructions at home:  If directed by your health care provider, drink an ORS:  Make an ORS by following instructions on the  package.  Start by drinking small amounts, about  cup (120 mL) every 5-10 minutes.  Slowly increase how much you drink until you have taken the amount recommended by your health care provider.  Drink enough clear fluid to keep your urine clear or pale yellow. If you were told to drink an ORS, finish the ORS first, then start slowly drinking other clear fluids. Drink fluids such as:  Water. Do not drink only water. Doing that can lead to having too little salt (sodium) in the body (hyponatremia).  Ice chips.  Fruit juice that you have added water to (diluted fruit juice).  Low-calorie sports drinks.  Avoid:  Alcohol.  Drinks that contain a lot of sugar. These include high-calorie sports drinks, fruit juice that is not diluted, and soda.  Caffeine.  Foods that are greasy or contain a lot of fat or sugar.  Take over-the-counter and prescription medicines only as told by your health care provider.  Do not take sodium tablets. This can lead to having too much sodium in the body (hypernatremia).  Eat foods that contain a healthy balance of electrolytes, such as bananas, oranges, potatoes, tomatoes, and spinach.  Keep all follow-up visits as told by your health care provider. This is important. Contact a health care provider if:  You have abdominal pain that:  Gets worse.  Stays in one area (localizes).  You have a rash.  You have a stiff neck.  You are more irritable than usual.  You are sleepier or more difficult to wake up than usual.  You feel weak or dizzy.  You feel very thirsty.  You have urinated only a small amount of very dark urine over 6-8 hours. Get help right away if:  You have symptoms of severe dehydration.  You cannot drink fluids without vomiting.  Your symptoms get worse with treatment.  You have a fever.  You have a severe headache.  You have vomiting or diarrhea that:  Gets worse.  Does not go away.  You have blood or green matter  (bile) in your vomit.  You have blood in your stool. This may cause stool to look black and tarry.  You have not urinated in 6-8 hours.  You faint.  Your heart rate while sitting still is over 100 beats a minute.  You have trouble breathing. This information is not intended to replace advice given to you by your health care provider. Make sure you discuss any questions you have with your health care provider. Document Released: 04/20/2005 Document Revised: 11/15/2015 Document Reviewed: 06/14/2015 Elsevier Interactive Patient Education  2017 [employer]  

## 2016-09-20 ENCOUNTER — Other Ambulatory Visit: Payer: Self-pay | Admitting: Oncology

## 2016-09-24 ENCOUNTER — Other Ambulatory Visit: Payer: Self-pay | Admitting: Nurse Practitioner

## 2016-09-24 ENCOUNTER — Telehealth: Payer: Self-pay | Admitting: Oncology

## 2016-09-24 ENCOUNTER — Ambulatory Visit (HOSPITAL_BASED_OUTPATIENT_CLINIC_OR_DEPARTMENT_OTHER): Payer: BLUE CROSS/BLUE SHIELD | Admitting: Nurse Practitioner

## 2016-09-24 ENCOUNTER — Ambulatory Visit (HOSPITAL_BASED_OUTPATIENT_CLINIC_OR_DEPARTMENT_OTHER): Payer: BLUE CROSS/BLUE SHIELD

## 2016-09-24 ENCOUNTER — Other Ambulatory Visit (HOSPITAL_BASED_OUTPATIENT_CLINIC_OR_DEPARTMENT_OTHER): Payer: BLUE CROSS/BLUE SHIELD

## 2016-09-24 VITALS — HR 108

## 2016-09-24 DIAGNOSIS — C3490 Malignant neoplasm of unspecified part of unspecified bronchus or lung: Secondary | ICD-10-CM

## 2016-09-24 DIAGNOSIS — R110 Nausea: Secondary | ICD-10-CM

## 2016-09-24 DIAGNOSIS — D630 Anemia in neoplastic disease: Secondary | ICD-10-CM

## 2016-09-24 DIAGNOSIS — Z5111 Encounter for antineoplastic chemotherapy: Secondary | ICD-10-CM | POA: Diagnosis not present

## 2016-09-24 DIAGNOSIS — D6481 Anemia due to antineoplastic chemotherapy: Secondary | ICD-10-CM

## 2016-09-24 DIAGNOSIS — G893 Neoplasm related pain (acute) (chronic): Secondary | ICD-10-CM

## 2016-09-24 DIAGNOSIS — C7951 Secondary malignant neoplasm of bone: Secondary | ICD-10-CM

## 2016-09-24 DIAGNOSIS — C3492 Malignant neoplasm of unspecified part of left bronchus or lung: Secondary | ICD-10-CM | POA: Diagnosis not present

## 2016-09-24 LAB — CBC WITH DIFFERENTIAL/PLATELET
BASO%: 0.7 % (ref 0.0–2.0)
Basophils Absolute: 0 10*3/uL (ref 0.0–0.1)
EOS%: 0.2 % (ref 0.0–7.0)
Eosinophils Absolute: 0 10*3/uL (ref 0.0–0.5)
HEMATOCRIT: 26.8 % — AB (ref 34.8–46.6)
HGB: 8.5 g/dL — ABNORMAL LOW (ref 11.6–15.9)
LYMPH#: 0.3 10*3/uL — AB (ref 0.9–3.3)
LYMPH%: 5.4 % — AB (ref 14.0–49.7)
MCH: 27.5 pg (ref 25.1–34.0)
MCHC: 31.6 g/dL (ref 31.5–36.0)
MCV: 87.1 fL (ref 79.5–101.0)
MONO#: 0.9 10*3/uL (ref 0.1–0.9)
MONO%: 15.9 % — ABNORMAL HIGH (ref 0.0–14.0)
NEUT#: 4.6 10*3/uL (ref 1.5–6.5)
NEUT%: 77.8 % — AB (ref 38.4–76.8)
Platelets: 166 10*3/uL (ref 145–400)
RBC: 3.08 10*6/uL — ABNORMAL LOW (ref 3.70–5.45)
RDW: 19.9 % — ABNORMAL HIGH (ref 11.2–14.5)
WBC: 5.9 10*3/uL (ref 3.9–10.3)

## 2016-09-24 LAB — COMPREHENSIVE METABOLIC PANEL
ALBUMIN: 3 g/dL — AB (ref 3.5–5.0)
ALK PHOS: 158 U/L — AB (ref 40–150)
ALT: 9 U/L (ref 0–55)
ANION GAP: 10 meq/L (ref 3–11)
AST: 13 U/L (ref 5–34)
BUN: 13.5 mg/dL (ref 7.0–26.0)
CALCIUM: 9.5 mg/dL (ref 8.4–10.4)
CHLORIDE: 104 meq/L (ref 98–109)
CO2: 23 mEq/L (ref 22–29)
CREATININE: 0.8 mg/dL (ref 0.6–1.1)
EGFR: 83 mL/min/{1.73_m2} — ABNORMAL LOW (ref >90)
Glucose: 112 mg/dl (ref 70–140)
POTASSIUM: 4.3 meq/L (ref 3.5–5.1)
Sodium: 137 mEq/L (ref 136–145)
Total Bilirubin: 0.22 mg/dL (ref 0.20–1.20)
Total Protein: 6.4 g/dL (ref 6.4–8.3)

## 2016-09-24 LAB — MAGNESIUM: MAGNESIUM: 1.5 mg/dL (ref 1.5–2.5)

## 2016-09-24 MED ORDER — SODIUM CHLORIDE 0.9 % IV SOLN
Freq: Once | INTRAVENOUS | Status: AC
  Administered 2016-09-24: 15:00:00 via INTRAVENOUS
  Filled 2016-09-24: qty 5

## 2016-09-24 MED ORDER — DEXTROSE-NACL 5-0.45 % IV SOLN
Freq: Once | INTRAVENOUS | Status: AC
  Administered 2016-09-24: 12:00:00 via INTRAVENOUS
  Filled 2016-09-24: qty 10

## 2016-09-24 MED ORDER — HEPARIN SOD (PORK) LOCK FLUSH 100 UNIT/ML IV SOLN
500.0000 [IU] | Freq: Once | INTRAVENOUS | Status: DC | PRN
  Filled 2016-09-24: qty 5

## 2016-09-24 MED ORDER — SODIUM CHLORIDE 0.9 % IV SOLN
30.0000 mg/m2 | Freq: Once | INTRAVENOUS | Status: AC
  Administered 2016-09-24: 41 mg via INTRAVENOUS
  Filled 2016-09-24: qty 41

## 2016-09-24 MED ORDER — IRINOTECAN HCL CHEMO INJECTION 100 MG/5ML
65.0000 mg/m2 | Freq: Once | INTRAVENOUS | Status: AC
  Administered 2016-09-24: 80 mg via INTRAVENOUS
  Filled 2016-09-24: qty 4

## 2016-09-24 MED ORDER — DEXAMETHASONE 4 MG PO TABS: 4.0000 mg | ORAL_TABLET | Freq: Two times a day (BID) | ORAL | 1 refills | Status: AC

## 2016-09-24 MED ORDER — SODIUM CHLORIDE 0.9% FLUSH
10.0000 mL | INTRAVENOUS | Status: DC | PRN
  Filled 2016-09-24: qty 10

## 2016-09-24 MED ORDER — SODIUM CHLORIDE 0.9 % IV SOLN
Freq: Once | INTRAVENOUS | Status: AC
  Administered 2016-09-24: 11:00:00 via INTRAVENOUS

## 2016-09-24 MED ORDER — PALONOSETRON HCL INJECTION 0.25 MG/5ML
0.2500 mg | Freq: Once | INTRAVENOUS | Status: AC
  Administered 2016-09-24: 0.25 mg via INTRAVENOUS

## 2016-09-24 MED ORDER — OXYCODONE-ACETAMINOPHEN 5-325 MG PO TABS
2.0000 | ORAL_TABLET | Freq: Once | ORAL | Status: AC
  Administered 2016-09-24: 2 via ORAL

## 2016-09-24 MED ORDER — OXYCODONE HCL 5 MG PO TABS: 10.0000 mg | ORAL_TABLET | ORAL | 0 refills | Status: DC | PRN

## 2016-09-24 MED ORDER — OXYCODONE-ACETAMINOPHEN 5-325 MG PO TABS: 2.0000 | ORAL_TABLET | Freq: Once | ORAL | 0 refills | Status: AC

## 2016-09-24 MED ORDER — ATROPINE SULFATE 1 MG/ML IJ SOLN
0.5000 mg | Freq: Once | INTRAMUSCULAR | Status: AC | PRN
  Administered 2016-09-24: 0.25 mg via INTRAVENOUS

## 2016-09-24 NOTE — Progress Notes (Signed)
[place] OFFICE PROGRESS NOTE   Diagnosis: Small cell lung cancer   INTERVAL HISTORY:   Jenny [name] returns as scheduled. She completed another cycle of irinotecan/cisplatin 09/11/2016. She received IV fluids last week. She was experiencing delayed nausea. She noted improvement with dexamethasone. She had mild nausea yesterday after eating. No vomiting. No mouth sores. No diarrhea. She has occasional loose stools. She notes improved pain control since the OxyContin dose was increased. She is taking less of the oxycodone. She thinks the skull masses are unchanged.  Objective:  Vital signs in last 24 hours:  Blood pressure 117/84, pulse (!) 129, temperature 97.5 F (36.4 C), temperature source Oral, resp. rate 17, height 5' (1.524 m), weight 88 lb 9.6 oz (40.2 kg), SpO2 98 %.    HEENT: No thrush or ulcers.  Resp: Distant breath sounds. Scattered wheezes. No respiratory distress. Cardio: Regular, tachycardic. GI: No hepatomegaly. Vascular: Trace edema bilateral ankles. Neuro: Alert and oriented.  Skin: No rash. Musculoskeletal: Multiple skull masses.    Lab Results:  Lab Results  Component Value Date   WBC 5.9 09/24/2016   HGB 8.5 (L) 09/24/2016   HCT 26.8 (L) 09/24/2016   MCV 87.1 09/24/2016   PLT 166 09/24/2016   NEUTROABS 4.6 09/24/2016    Imaging:  No results found.  Medications: I have reviewed the patient's current medications.  Assessment/Plan: 1.Limited stage small cell lung cancer, diagnosed on biopsy of a subcarinal lymph node and left lung mass by [facility] 09/12/2015  PET scan 09/03/2015 confirmed a hypermetabolic left perihilar mass, 2 left lower lobe pulmonary masses, and mediastinal lymphadenopathy, no evidence of distant metastatic disease  Brain MRI negative for metastatic disease 09/13/2015  Status post chest radiation 09/26/2015 through 10/18/2015  Cycle 1 cisplatin/etoposide beginning 09/26/2015  Cycle 2 cisplatin/etoposide  beginning 10/16/2015  Cycle 3 carboplatin/etoposide beginning 11/06/2015  12/17/2015 MRI of the brain negative  12/17/2015 CT scans chest/abdomen/pelvis (report currently not available)  Bone scan 12/27/2015 with minimal increased uptake in the posterior aspect of the right 11th rib that does not appear abnormal on the CT images of August 2017; sclerotic focus noted on the previous CT scan in the body of L1 showed no abnormal uptake on the current bone scan; asymmetric increased uptake in the left SI joint region possibly corresponding to a subtle sclerotic focus in the medial posterior aspect of the left iliac bone  CT chest 04/07/2016-new liver metastases, new upper abdominal adenopathy, left iliac metastasis, new right posterior lateral Pleural/chest wall metastasis  Cycle 1 salvage chemotherapy with etoposide/carboplatin01/12/2016  Cycle 2 salvage chemotherapy with etoposide/carboplatin 06/09/2016-Neulasta added  Restaging CTs of the head, chest, abdomen, and pelvis on 07/06/2016-progressive skull metastases with intracranial/extracranial extension, stable lung nodules, progressive disease in the liver, bones, enlargement of a right chest wall mass, and enlarging peritoneal nodule  Cycle 1 irinotecan/cisplatin 07/13/2016  Cycle 2 irinotecan/cisplatin 07/27/2016  Cycle 3 irinotecan/cisplatin 08/10/2016  CT right arm 08/14/2016-metastatic lesion proximal right humeral shaft with a moth-eatenappearance. At risk for pathologic fracture. Metastatic lesion in the proximal right radial shaft with apparent extension into adjacent soft tissues.  CT chest 08/20/2016-overall stable disease, mixed response with a decrease in the right chest wall mass and a lingular nodule, slight increase in several of the liver lesions  Cycle 4 irinotecan/cisplatin 08/28/2016  Cycle 5 irinotecan/cisplatin 09/11/2016  Cycle 6 irinotecan/cisplatin 09/24/2016  2. Odynophagia secondary to radiation  esophagitis, confirmed on upper endoscopy 11/12/2015  3. Weight loss secondary to #2  4. History of  febrile neutropenia following chemotherapy  5. Remote history of a right leg DVT-2008?-Maintained on Lovenox  6. History of tobacco use  7. Admission to [facility] 10/27/2015 with neutropenic fever, esophagitis, and dehydration  8. Hypercalcemia 05/01/2016-status post intravenous fluids and Zometa12/29/2017  9. Fever prior to hospital admission 05/17/2016-infectious versus tumor fever; treated for pneumonia, respiratory panel positive for coronavirus  10. 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 Altered mental status-potentially related to infection/delirium, concern for brain metastases; brain CT 05/19/2016-no brain metastases, multiple skull metastases; improved 05/28/2016, 06/22/2016.  12. History of Leukopenia/thrombocytopenia secondary to chemotherapy-Neulasta added with cycle 2 etoposide/carboplatin  13. Tachycardia/hypotension-persistent, likely secondary to dehydration;improved 09/04/2016    Disposition: Ms. Tilson appears stable. She has completed 5 cycles of irinotecan/cisplatin. Plan to proceed with cycle 6 today as scheduled.   She will begin dexamethasone twice daily for 3 days on 09/25/2016 for delayed nausea. She will receive IV fluids on 10/02/2016.   She notes improved pain control with the increased dose of OxyContin. She is taking less of the oxycodone. She was provided with a new oxycodone prescription at today's visit.   She will return for a follow-up visit and the next cycle of chemotherapy on 10/09/2016. She will contact the office in the interim with any problems.  Plan reviewed with Dr. Benay Spice. 25 minutes were spent face-to-face at today's visit with the majority of that time involved in counseling/coordination of care.   Jenny, [name] ANP/GNP-BC    09/24/2016  9:23 AM

## 2016-09-24 NOTE — Patient Instructions (Signed)
[place] Discharge Instructions for Patients Receiving Chemotherapy  Today you received the following chemotherapy agents Irinotecan/Cisplatin  To help prevent nausea and vomiting after your treatment, we encourage you to take your nausea medication    If you develop nausea and vomiting that is not controlled by your nausea medication, call the clinic.   BELOW ARE SYMPTOMS THAT SHOULD BE REPORTED IMMEDIATELY:  *FEVER GREATER THAN 100.5 F  *CHILLS WITH OR WITHOUT FEVER  NAUSEA AND VOMITING THAT IS NOT CONTROLLED WITH YOUR NAUSEA MEDICATION  *UNUSUAL SHORTNESS OF BREATH  *UNUSUAL BRUISING OR BLEEDING  TENDERNESS IN MOUTH AND THROAT WITH OR WITHOUT PRESENCE OF ULCERS  *URINARY PROBLEMS  *BOWEL PROBLEMS  UNUSUAL RASH Items with * indicate a potential emergency and should be followed up as soon as possible.  Feel free to call the clinic you have any questions or concerns. The clinic phone number is [phone].  Please show the [place] at check-in to the Emergency Department and triage nurse.

## 2016-09-24 NOTE — Progress Notes (Signed)
Okay for patient to only receive 250 ml of IVFs with Cisplatin, per Dr. Benay Spice.

## 2016-09-24 NOTE — Telephone Encounter (Signed)
Gave patient AVS and calender per 5/24 LOS -

## 2016-09-25 ENCOUNTER — Other Ambulatory Visit: Payer: Self-pay

## 2016-09-25 ENCOUNTER — Ambulatory Visit: Payer: Self-pay | Admitting: Oncology

## 2016-09-29 ENCOUNTER — Observation Stay (HOSPITAL_COMMUNITY)
Admission: EM | Admit: 2016-09-29 | Discharge: 2016-10-01 | Disposition: A | Discharge status: Home / Self Care | Payer: BLUE CROSS/BLUE SHIELD | Attending: Internal Medicine | Admitting: Internal Medicine

## 2016-09-29 ENCOUNTER — Encounter (HOSPITAL_COMMUNITY): Payer: Self-pay | Admitting: *Deleted

## 2016-09-29 ENCOUNTER — Observation Stay (HOSPITAL_COMMUNITY): Payer: BLUE CROSS/BLUE SHIELD

## 2016-09-29 DIAGNOSIS — R630 Anorexia: Secondary | ICD-10-CM | POA: Diagnosis present

## 2016-09-29 DIAGNOSIS — J449 Chronic obstructive pulmonary disease, unspecified: Secondary | ICD-10-CM | POA: Diagnosis present

## 2016-09-29 DIAGNOSIS — C3490 Malignant neoplasm of unspecified part of unspecified bronchus or lung: Secondary | ICD-10-CM | POA: Diagnosis present

## 2016-09-29 DIAGNOSIS — Z7901 Long term (current) use of anticoagulants: Secondary | ICD-10-CM | POA: Diagnosis not present

## 2016-09-29 DIAGNOSIS — R410 Disorientation, unspecified: Secondary | ICD-10-CM | POA: Diagnosis not present

## 2016-09-29 DIAGNOSIS — I82409 Acute embolism and thrombosis of unspecified deep veins of unspecified lower extremity: Secondary | ICD-10-CM | POA: Diagnosis present

## 2016-09-29 DIAGNOSIS — E860 Dehydration: Secondary | ICD-10-CM | POA: Diagnosis not present

## 2016-09-29 DIAGNOSIS — Z87891 Personal history of nicotine dependence: Secondary | ICD-10-CM | POA: Insufficient documentation

## 2016-09-29 DIAGNOSIS — G9340 Encephalopathy, unspecified: Secondary | ICD-10-CM | POA: Diagnosis present

## 2016-09-29 DIAGNOSIS — T451X5A Adverse effect of antineoplastic and immunosuppressive drugs, initial encounter: Secondary | ICD-10-CM | POA: Diagnosis not present

## 2016-09-29 DIAGNOSIS — Z85118 Personal history of other malignant neoplasm of bronchus and lung: Secondary | ICD-10-CM | POA: Diagnosis not present

## 2016-09-29 DIAGNOSIS — D6481 Anemia due to antineoplastic chemotherapy: Secondary | ICD-10-CM | POA: Diagnosis not present

## 2016-09-29 DIAGNOSIS — E43 Unspecified severe protein-calorie malnutrition: Secondary | ICD-10-CM | POA: Diagnosis present

## 2016-09-29 DIAGNOSIS — D649 Anemia, unspecified: Secondary | ICD-10-CM | POA: Diagnosis present

## 2016-09-29 DIAGNOSIS — R0602 Shortness of breath: Secondary | ICD-10-CM

## 2016-09-29 LAB — URINALYSIS, ROUTINE W REFLEX MICROSCOPIC
Bilirubin Urine: NEGATIVE
Glucose, UA: NEGATIVE mg/dL
Hgb urine dipstick: NEGATIVE
Ketones, ur: NEGATIVE mg/dL
LEUKOCYTES UA: NEGATIVE
NITRITE: NEGATIVE
PH: 5 (ref 5.0–8.0)
Protein, ur: NEGATIVE mg/dL
SPECIFIC GRAVITY, URINE: 1.005 (ref 1.005–1.030)

## 2016-09-29 LAB — FERRITIN: FERRITIN: 947 ng/mL — AB (ref 11–307)

## 2016-09-29 LAB — COMPREHENSIVE METABOLIC PANEL
ALT: 7 U/L — ABNORMAL LOW (ref 14–54)
AST: 18 U/L (ref 15–41)
Albumin: 2.7 g/dL — ABNORMAL LOW (ref 3.5–5.0)
Alkaline Phosphatase: 106 U/L (ref 38–126)
Anion gap: 9 (ref 5–15)
BUN: 20 mg/dL (ref 6–20)
CHLORIDE: 99 mmol/L — AB (ref 101–111)
CO2: 24 mmol/L (ref 22–32)
Calcium: 8.4 mg/dL — ABNORMAL LOW (ref 8.9–10.3)
Creatinine, Ser: 0.83 mg/dL (ref 0.44–1.00)
GFR calc Af Amer: >60 mL/min (ref >60)
Glucose, Bld: 89 mg/dL (ref 65–99)
POTASSIUM: 4.7 mmol/L (ref 3.5–5.1)
SODIUM: 132 mmol/L — AB (ref 135–145)
Total Bilirubin: 0.2 mg/dL — ABNORMAL LOW (ref 0.3–1.2)
Total Protein: 5.7 g/dL — ABNORMAL LOW (ref 6.5–8.1)

## 2016-09-29 LAB — PROTIME-INR
INR: 0.98
INR: 1
Prothrombin Time: 13 seconds (ref 11.4–15.2)
Prothrombin Time: 13.2 seconds (ref 11.4–15.2)

## 2016-09-29 LAB — CBC
HCT: 21.8 % — ABNORMAL LOW (ref 36.0–46.0)
Hemoglobin: 6.9 g/dL — CL (ref 12.0–15.0)
MCH: 27.8 pg (ref 26.0–34.0)
MCHC: 31.7 g/dL (ref 30.0–36.0)
MCV: 87.9 fL (ref 78.0–100.0)
Platelets: 98 10*3/uL — ABNORMAL LOW (ref 150–400)
RBC: 2.48 MIL/uL — AB (ref 3.87–5.11)
RDW: 18.5 % — ABNORMAL HIGH (ref 11.5–15.5)
WBC: 4.3 10*3/uL (ref 4.0–10.5)

## 2016-09-29 LAB — LACTATE DEHYDROGENASE: LDH: 132 U/L (ref 98–192)

## 2016-09-29 LAB — PREPARE RBC (CROSSMATCH)

## 2016-09-29 LAB — LIPASE, BLOOD: Lipase: 26 U/L (ref 11–51)

## 2016-09-29 LAB — RETICULOCYTES
RBC.: 2.43 MIL/uL — ABNORMAL LOW (ref 3.87–5.11)
Retic Ct Pct: <0.4 % — ABNORMAL LOW (ref 0.4–3.1)

## 2016-09-29 LAB — IRON AND TIBC
IRON: 16 ug/dL — AB (ref 28–170)
Saturation Ratios: 7 % — ABNORMAL LOW (ref 10.4–31.8)
TIBC: 237 ug/dL — ABNORMAL LOW (ref 250–450)
UIBC: 221 ug/dL

## 2016-09-29 LAB — TSH: TSH: 2.227 u[IU]/mL (ref 0.350–4.500)

## 2016-09-29 LAB — VITAMIN B12: Vitamin B-12: 160 pg/mL — ABNORMAL LOW (ref 180–914)

## 2016-09-29 LAB — FOLATE: Folate: 4.8 ng/mL — ABNORMAL LOW (ref >5.9)

## 2016-09-29 LAB — CBG MONITORING, ED: Glucose-Capillary: 82 mg/dL (ref 65–99)

## 2016-09-29 MED ORDER — BISACODYL 10 MG RE SUPP: 10.0000 mg | Freq: Every day | RECTAL | Status: DC | PRN

## 2016-09-29 MED ORDER — GUAIFENESIN ER 600 MG PO TB12
600.0000 mg | ORAL_TABLET | Freq: Two times a day (BID) | ORAL | Status: DC
  Filled 2016-09-29: qty 1

## 2016-09-29 MED ORDER — ALBUTEROL SULFATE (2.5 MG/3ML) 0.083% IN NEBU: 2.5000 mg | INHALATION_SOLUTION | RESPIRATORY_TRACT | Status: DC | PRN

## 2016-09-29 MED ORDER — HALOPERIDOL LACTATE 5 MG/ML IJ SOLN
2.0000 mg | Freq: Four times a day (QID) | INTRAMUSCULAR | Status: DC | PRN
  Administered 2016-09-29 – 2016-09-30 (×3): 2 mg via INTRAVENOUS
  Filled 2016-09-29 (×3): qty 1

## 2016-09-29 MED ORDER — SODIUM CHLORIDE 0.9 % IV BOLUS (SEPSIS)
1000.0000 mL | Freq: Once | INTRAVENOUS | Status: AC
  Administered 2016-09-29: 1000 mL via INTRAVENOUS

## 2016-09-29 MED ORDER — ENSURE ENLIVE PO LIQD: 237.0000 mL | Freq: Two times a day (BID) | ORAL | Status: DC

## 2016-09-29 MED ORDER — PROCHLORPERAZINE MALEATE 10 MG PO TABS: 10.0000 mg | ORAL_TABLET | Freq: Four times a day (QID) | ORAL | Status: DC | PRN

## 2016-09-29 MED ORDER — ACETAMINOPHEN 500 MG PO TABS: 500.0000 mg | ORAL_TABLET | Freq: Three times a day (TID) | ORAL | Status: DC | PRN

## 2016-09-29 MED ORDER — SODIUM CHLORIDE 0.9 % IV SOLN: Freq: Once | INTRAVENOUS | Status: DC

## 2016-09-29 MED ORDER — ENOXAPARIN SODIUM 60 MG/0.6ML ~~LOC~~ SOLN
1.5000 mg/kg | SUBCUTANEOUS | Status: DC
  Administered 2016-09-30: 18:00:00 60 mg via SUBCUTANEOUS
  Filled 2016-09-29: qty 0.6

## 2016-09-29 MED ORDER — POLYETHYLENE GLYCOL 3350 17 G PO PACK: 17.0000 g | PACK | Freq: Every day | ORAL | Status: DC | PRN

## 2016-09-29 MED ORDER — SENNOSIDES-DOCUSATE SODIUM 8.6-50 MG PO TABS
1.0000 | ORAL_TABLET | Freq: Two times a day (BID) | ORAL | Status: DC
  Administered 2016-09-30: 1 via ORAL
  Filled 2016-09-29 (×3): qty 1

## 2016-09-29 MED ORDER — ONDANSETRON HCL 4 MG PO TABS: 4.0000 mg | ORAL_TABLET | Freq: Four times a day (QID) | ORAL | Status: DC | PRN

## 2016-09-29 MED ORDER — ALPRAZOLAM 0.25 MG PO TABS
0.2500 mg | ORAL_TABLET | Freq: Every day | ORAL | Status: DC | PRN
  Administered 2016-09-29 – 2016-09-30 (×2): 0.25 mg via ORAL
  Filled 2016-09-29 (×2): qty 1

## 2016-09-29 MED ORDER — OXYCODONE HCL ER 40 MG PO T12A
40.0000 mg | EXTENDED_RELEASE_TABLET | Freq: Two times a day (BID) | ORAL | Status: DC
  Administered 2016-09-29 – 2016-09-30 (×3): 40 mg via ORAL
  Filled 2016-09-29 (×2): qty 1

## 2016-09-29 MED ORDER — SODIUM CHLORIDE 0.9 % IV SOLN: 10.0000 mL/h | Freq: Once | INTRAVENOUS | Status: DC

## 2016-09-29 MED ORDER — PANTOPRAZOLE SODIUM 40 MG PO TBEC
40.0000 mg | DELAYED_RELEASE_TABLET | Freq: Two times a day (BID) | ORAL | Status: DC
  Administered 2016-09-29 – 2016-09-30 (×3): 40 mg via ORAL
  Filled 2016-09-29 (×4): qty 1

## 2016-09-29 MED ORDER — OXYCODONE HCL ER 10 MG PO T12A: 40.0000 mg | EXTENDED_RELEASE_TABLET | Freq: Two times a day (BID) | ORAL | Status: DC

## 2016-09-29 MED ORDER — OXYCODONE HCL 5 MG PO TABS
10.0000 mg | ORAL_TABLET | ORAL | Status: DC | PRN
  Administered 2016-09-29 – 2016-09-30 (×4): 15 mg via ORAL
  Filled 2016-09-29 (×4): qty 3

## 2016-09-29 MED ORDER — FUROSEMIDE 10 MG/ML IJ SOLN
40.0000 mg | Freq: Once | INTRAMUSCULAR | Status: AC
  Administered 2016-09-29: 40 mg via INTRAVENOUS
  Filled 2016-09-29: qty 4

## 2016-09-29 MED ORDER — DIPHENOXYLATE-ATROPINE 2.5-0.025 MG PO TABS: 2.0000 | ORAL_TABLET | Freq: Four times a day (QID) | ORAL | Status: DC | PRN

## 2016-09-29 MED ORDER — DEXAMETHASONE 4 MG PO TABS: 4.0000 mg | ORAL_TABLET | Freq: Two times a day (BID) | ORAL | Status: DC

## 2016-09-29 MED ORDER — ONDANSETRON HCL 4 MG/2ML IJ SOLN: 4.0000 mg | Freq: Four times a day (QID) | INTRAMUSCULAR | Status: DC | PRN

## 2016-09-29 NOTE — H&P (Signed)
[facility] H&P   Patient Demographics:    Jenny [name], is a 66 y.o. female  MRN: [id number]   DOB - Jan 09, 1951  Admit Date - 09/29/2016  Outpatient Primary MD for the patient is Eulas Post, MD  Outpatient Specialists: Dr Benay Spice    Patient coming from: Home  Chief Complaint  Patient presents with  . Altered Mental Status  . decreased appetite      HPI:    Jenny [name]  is a 66 y.o. female, With history of left-sided small cell lung cancer undergoing chemotherapy, under the care of Dr. Malachy Mood, history of DVT on Lovenox, history of chemotherapy-induced anemia requiring outpatient transfusion, remote history of smoking, COPD, severe protein calorie malnutrition, anxiety brought in by family members with chief complaints of not feeling well, feeling weak and slightly confused, came to the ER where workup suggested symptomatic anemia and I was called to admit.  Patient denies any fever or chills, no headache, no chest or abdominal pain, no productive cough or shortness of breath, she is mildly constipated but no blood in stool or urine, no dark stools, no focal weakness, no skin rashes or bruises, no new joint pains or aches.    Review of systems:    In addition to the HPI above,   No Fever-chills, No Headache, No changes with Vision or hearing, No problems swallowing food or Liquids, No Chest pain, Cough or Shortness of Breath, No Abdominal pain, No Nausea or Vommitting, Bowel movements are regular, No Blood in stool or Urine, No dysuria, No new skin rashes or bruises, No new joints pains-aches,  No new weakness, tingling, numbness in any extremity, Positive generalized weakness and fatigue No recent weight  gain or loss, No polyuria, polydypsia or polyphagia, No significant Mental Stressors.  A full 10 point Review of Systems was done, except as stated above, all other Review of Systems were negative.   With Past History of the following :    Past Medical History:  Diagnosis Date  . DVT (deep venous thrombosis) ([place])    1 year ago  . History of chemotherapy   . Hx of radiation therapy   . Hypercholesteremia   . Lung cancer [facility])       Past Surgical History:  Procedure Laterality Date  . CESAREAN SECTION    . ESOPHAGOGASTRODUODENOSCOPY (  EGD) WITH PROPOFOL N/A 11/12/2015   Procedure: ESOPHAGOGASTRODUODENOSCOPY (EGD) WITH PROPOFOL;  Surgeon: Doran Stabler, MD;  Location: [facility] ENDOSCOPY;  Service: Endoscopy;  Laterality: N/A;  With propofol if available      Social History:     Social History  Substance Use Topics  . Smoking status: Former Smoker    Packs/day: 1.00    Years: 39.00    Types: Cigarettes    Quit date: 08/20/2015  . Smokeless tobacco: Never Used  . Alcohol use No         Family History :     Family History  Problem Relation Age of Onset  . Stroke Mother   . Hypertension Mother   . Cancer Mother        head and neck cancer and non hodgkin's lymphoma  . Cancer Father        colon/rectal and squamous cell carcinoma skin  . Cancer Paternal Uncle        lung  . Cancer Paternal Grandfather        lymphoma  . Cancer Maternal Aunt        colon cancer  . Cancer Maternal Grandfather        mesothelioma       Home Medications:   Prior to Admission medications   Medication Sig Start Date End Date Taking? Authorizing Provider  acetaminophen (TYLENOL) 500 MG tablet Take 500 mg by mouth every 8 (eight) hours as needed for mild pain.    Yes [provider]  ALPRAZolam (XANAX) 0.25 MG tablet Take 1 tablet (0.25 mg total) by mouth daily as needed for anxiety or sleep. 08/28/16  Yes Owens Shark, NP  dexamethasone (DECADRON) 4 MG tablet Take 1  tablet (4 mg total) by mouth 2 (two) times daily with a meal. Begin the day after chemotherapy 09/24/16  Yes Owens Shark, NP  diphenoxylate-atropine (LOMOTIL) 2.5-0.025 MG tablet Take 2 tablets by mouth 4 (four) times daily as needed for diarrhea or loose stools. 08/20/16  Yes Ladell Pier, MD  enoxaparin (LOVENOX) 80 MG/0.8ML injection Inject 0.65 mLs (65 mg total) into the skin daily. 06/10/16  Yes Burchette, Alinda Sierras, MD  feeding supplement, ENSURE ENLIVE, (ENSURE ENLIVE) LIQD Take 237 mLs by mouth 2 (two) times daily between meals. 05/20/16  Yes Sheikh, Omair Latif, DO  ondansetron (ZOFRAN-ODT) 8 MG disintegrating tablet Take 1 tablet (8 mg total) by mouth every 8 (eight) hours as needed for nausea or vomiting. 08/21/16  Yes Ladell Pier, MD  oxyCODONE (OXY IR/ROXICODONE) 5 MG immediate release tablet Take 2-3 tablets (10-15 mg total) by mouth every 4 (four) hours as needed for severe pain. 09/24/16  Yes Owens Shark, NP  oxyCODONE (OXYCONTIN) 40 mg 12 hr tablet Take 1 tablet (40 mg total) by mouth every 12 (twelve) hours. 09/18/16  Yes Owens Shark, NP  polyethylene glycol [facility]) packet Take 17 g by mouth daily as needed for mild constipation. 05/20/16  Yes Sheikh, Omair Latif, DO  prochlorperazine (COMPAZINE) 10 MG tablet Take 1 tablet (10 mg total) by mouth every 6 (six) hours as needed for nausea or vomiting. 07/13/16  Yes Ladell Pier, MD  senna-docusate (SENOKOT-S) 8.6-50 MG tablet Take 1 tablet by mouth 2 (two) times daily. 05/01/16  Yes Ladell Pier, MD  guaiFENesin (MUCINEX) 600 MG 12 hr tablet Take 1 tablet (600 mg total) by mouth 2 (two) times daily. Patient not taking: Reported on 09/29/2016 05/20/16  Sheikh, Omair Latif, DO  Multiple Vitamin (MULTIVITAMIN WITH MINERALS) TABS tablet Take 1 tablet by mouth daily. Patient not taking: Reported on 09/29/2016 05/21/16   Kerney Elbe, DO     Allergies:     Allergies  Allergen Reactions  . Fentanyl     Patient  prefers to never take this medication again. She doesn't like the way it makes her feel.   . Morphine And Related Itching and Rash     Physical Exam:   Vitals  Blood pressure 99/64, pulse (!) 113, temperature 97.5 F (36.4 C), resp. rate 18, height 5\' 5"  (1.651 m), weight 39.9 kg (88 lb).   1. General Thin cachectic middle-aged white female sitting in hospital bed in no discomfort,  2. Normal affect and insight, Not Suicidal or Homicidal, Awake , mildly confused oriented 2.  3. No F.N deficits, ALL C.Nerves Intact, Strength 5/5 all 4 extremities, Sensation intact all 4 extremities, Plantars down going.  4. Ears and Eyes appear Normal, Conjunctivae clear, PERRLA. Moist Oral Mucosa.  5. Supple Neck, No JVD, No cervical lymphadenopathy appriciated, No Carotid Bruits.  6. Symmetrical Chest wall movement, Good air movement bilaterally, CTAB.  7. RRR, No Gallops, Rubs or Murmurs, No Parasternal Heave.  8. Positive Bowel Sounds, Abdomen Soft, No tenderness, No organomegaly appriciated,No rebound -guarding or rigidity. Hemoccult negative.  9.  No Cyanosis, Normal Skin Turgor, No Skin Rash or Bruise.  10. Good muscle tone,  joints appear normal , no effusions, Normal ROM.  11. No Palpable Lymph Nodes in Neck or Axillae      Data Review:    CBC  Recent Labs Lab 09/24/16 0827 09/29/16 0638  WBC 5.9 4.3  HGB 8.5* 6.9*  HCT 26.8* 21.8*  PLT 166 98*  MCV 87.1 87.9  MCH 27.5 27.8  MCHC 31.6 31.7  RDW 19.9* 18.5*  LYMPHSABS 0.3*  --   MONOABS 0.9  --   EOSABS 0.0  --   BASOSABS 0.0  --    ------------------------------------------------------------------------------------------------------------------  Chemistries   Recent Labs Lab 09/24/16 0826 09/29/16 0638  NA 137 132*  K 4.3 4.7  CL  --  99*  CO2 23 24  GLUCOSE 112 89  BUN 13.5 20  CREATININE 0.8 0.83  CALCIUM 9.5 8.4*  MG 1.5  --   AST 13 18  ALT 9 7*  ALKPHOS 158* 106  BILITOT 0.22 0.2*    ------------------------------------------------------------------------------------------------------------------ estimated creatinine clearance is 43.7 mL/min (by C-G formula based on SCr of 0.83 mg/dL). ------------------------------------------------------------------------------------------------------------------ No results for input(s): TSH, T4TOTAL, T3FREE, THYROIDAB in the last 72 hours.  Invalid input(s): FREET3  Coagulation profile  Recent Labs Lab 09/29/16 0819  INR 0.98   ------------------------------------------------------------------------------------------------------------------- No results for input(s): DDIMER in the last 72 hours. -------------------------------------------------------------------------------------------------------------------  Cardiac Enzymes No results for input(s): CKMB, TROPONINI, MYOGLOBIN in the last 168 hours.  Invalid input(s): CK ------------------------------------------------------------------------------------------------------------------ No results found for: BNP   ---------------------------------------------------------------------------------------------------------------  Urinalysis    Component Value Date/Time   COLORURINE YELLOW 05/17/2016 0933   APPEARANCEUR CLEAR 05/17/2016 0933   LABSPEC 1.016 05/17/2016 0933   PHURINE 6.0 05/17/2016 0933   GLUCOSEU NEGATIVE 05/17/2016 0933   HGBUR NEGATIVE 05/17/2016 0933   BILIRUBINUR NEGATIVE 05/17/2016 0933   BILIRUBINUR negative 04/18/2015 0856   KETONESUR 20 (A) 05/17/2016 0933   PROTEINUR NEGATIVE 05/17/2016 0933   UROBILINOGEN 0.2 04/18/2015 0856   NITRITE NEGATIVE 05/17/2016 0933   LEUKOCYTESUR NEGATIVE 05/17/2016 0933    ----------------------------------------------------------------------------------------------------------------   Imaging Results:  No results found.    Baseline chest x-ray and UA ordered.   Assessment & Plan:      1. Generalized  fatigue, mild confusion. Likely due to symptomatic anemia chemotherapy induced. She had her last chemotherapy a few days ago, Hemoccult negative and no indications of acute blood loss. Will obtain anemia panel, transfused 2 units of packed RBCs as ordered by the ED physician, gentle Lasix after the second unit, repeat H&H in the morning. If no further blood loss and she is symptom free can be discharged. We'll elect to rule out UTI and will check a UA, no pulmonary symptoms but will check a baseline chest x-ray unless we see a large new infiltrate I do not suspect pneumonia etc.   2. Left-sided small cell lung cancer. Dr. Benay Spice informed through epic, outpatient follow-up with him after discharge.  3. Severe protein calorie malnutrition with cachexia. Continue protein supplementation.  4. History of DVT. Pharmacy to dose for dose Lovenox which she takes at home.  5. Underlying COPD. No acute issues. Supportive care.    DVT Prophylaxis Lovenox    AM Labs Ordered, also please review Full Orders  Family Communication: Admission, patients condition and plan of care including tests being ordered have been discussed with the patient and family who indicate understanding and agree with the plan and Code Status.  Code Status DNR  Likely DC to  Home  Condition GUARDED    Consults called: Oncology informed - Dr Benay Spice    Admission status: Obs    Time spent in minutes : 30   Lala Lund M.D on 09/29/2016 at 9:13 AM  Between 7am to 7pm - Pager - [phone] ( page via [facility], text pages only, please mention full 10 digit call back number).  After 7pm go to [email or web address] - password [facility]  [facility] - Office  [phone]

## 2016-09-29 NOTE — Progress Notes (Addendum)
[facility] for Lovenox Indication: h/o DVT  Allergies  Allergen Reactions  . Fentanyl     Patient prefers to never take this medication again. She doesn't like the way it makes her feel.   . Morphine And Related Itching and Rash    Patient Measurements: Height: 5\' 5"  (165.1 cm) Weight: 88 lb (39.9 kg) IBW/kg (Calculated) : 57  Vital Signs: Temp: 98.5 F (36.9 C) (05/29 1131) Temp Source: Oral (05/29 1131) BP: 106/67 (05/29 1131) Pulse Rate: 94 (05/29 1131)  Labs:  Recent Labs  09/29/16 0638 09/29/16 0819  HGB 6.9*  --   HCT 21.8*  --   PLT 98*  --   LABPROT  --  13.0  INR  --  0.98  CREATININE 0.83  --     Estimated Creatinine Clearance: 43.7 mL/min (by C-G formula based on SCr of 0.83 mg/dL).    Assessment: 53 yoF with history of left-sided small cell lung cancer undergoing chemotherapy, history of DVT on Lovenox, admitted for chemotherapy-induced anemia.  Hemoccult negative and no indications of acute blood loss per MD. Transfusion of 2 units PRBC ordered.  Pharmacy asked to resume patient's home Lovenox treatment.  PTA dose is Lovenox 65 mg daily with last dose reported at 5/28 at 0800.  Appears patient has experienced some weight loss recently so will reduce dose slightly.    Goal of Therapy:  Anti-Xa level 0.6-1 units/ml 4hrs after LMWH dose given Monitor platelets by anticoagulation protocol: Yes   Plan:  Lovenox 60 mg (1.5 mg/kg) SQ q24h.   Monitor SCr and CBC at least q72h.  Hershal Coria 09/29/2016,12:01 PM

## 2016-09-29 NOTE — Progress Notes (Signed)
As I was administering the diluted 40 mg of Lasix via the l AC piv .Pessy began to scream loudly that it hurt over and over. There was a good blood from the IV site o edema or redness. I attempted to use the other PIV in the left cephalic piv.She screamed then also. The piv has a good blood return, no edema or redness, The lasix was not completely administered

## 2016-09-29 NOTE — ED Triage Notes (Signed)
Pt BIB family members for a sudden onset of confusion this morning and possible dehydration. Per husband pt has been eating well however not drinking. Pt had similar episodes in the past when she had to be admitted for dehydration. Pt's last chemo therapy was last Thursday (every 2 weeks).

## 2016-09-29 NOTE — ED Provider Notes (Signed)
Emergency Department Provider Note   I have reviewed the triage vital signs and the nursing notes.   HISTORY  Chief Complaint Altered Mental Status and decreased appetite   HPI Jenny [name] is a 66 y.o. 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 Family at bedside states that over the past 2 days she's become much more weak and slightly confused. She's had similar episodes in the past with dehydration. She's also had issues with anemia in the past that required blood transfusions. Her last blood transfusion was January of this year. The patient denies any pain other than chronic pain in her lower back. No fevers or chills. No difficulty breathing or chest pain. Patient has not had vomiting or diarrhea. She did feel sick yesterday after eating a peanut butter and jelly sandwich but is otherwise been eating well. The family states she's not been drinking much of anything. They estimate that she had approximate 4 ounces of fluid yesterday PO. No radiation of symptoms.   Chemotherapy: 6 cycle irinotecan/cisplatin Oncologist: Dr. Benay Spice    Past Medical History:  Diagnosis Date  . DVT (deep venous thrombosis) ([place])    1 year ago  . History of chemotherapy   . Hx of radiation therapy   . Hypercholesteremia   . Lung cancer [facility])     Patient Active Problem List   Diagnosis Date Noted  . Symptomatic anemia 09/29/2016  . Constipation 08/31/2016  . Protein-calorie malnutrition, severe 05/19/2016  . Altered mental status   . Encephalopathy acute   . Pressure injury of skin 05/18/2016  . Anemia associated with chemotherapy 05/17/2016  . HCAP (healthcare-associated pneumonia) 05/17/2016  . Encephalopathy 05/17/2016  . Dehydration with hyponatremia 05/17/2016  . Goals of care, counseling/discussion 05/01/2016  . Malnutrition of moderate  degree 11/12/2015  . Dysphagia 11/11/2015  . Esophagitis 11/11/2015  . Odynophagia   . Anorexia   . Small cell lung cancer ([place]) 09/18/2015  . Community acquired pneumonia of left lower lobe of lung ([place]) 08/28/2015  . Hemoptysis 08/28/2015  . COPD (chronic obstructive pulmonary disease) ([place]) 08/28/2015  . Lung mass 08/26/2015  . Other acute sinusitis 10/27/2013  . Anticoagulant long-term use 10/27/2013  . Cough over 2 weeks 10/27/2013  . Encounter for therapeutic drug monitoring 10/05/2013  . Hyperlipidemia 11/04/2011  . DVT, lower extremity, recurrent ([place]) 11/04/2011  . Nicotine use disorder 11/04/2011    Past Surgical History:  Procedure Laterality Date  . CESAREAN SECTION    . ESOPHAGOGASTRODUODENOSCOPY (EGD) WITH PROPOFOL N/A 11/12/2015   Procedure: ESOPHAGOGASTRODUODENOSCOPY (EGD) WITH PROPOFOL;  Surgeon: Doran Stabler, MD;  Location: [facility];  Service: Endoscopy;  Laterality: N/A;  With propofol if available    Current Outpatient Rx  . Order #: [id number] Class: Historical Med  . Order #: [id number] Class: Print  . Order #: [id number] Class: Normal  . Order #: [id number] Class: Phone In  . Order #: [id number] Class: Normal  . Order #: [id number] Class: Normal  . Order #: [id number] Class: Normal  . Order #: [id number] Class: Print  . Order #: [id number] Class: Print  . Order #: [id number] Class: Normal  . Order #: [id number] Class: Normal  . Order #: [id number] Class: OTC  . Order #: [id number] Class: Normal  . Order #: [id number] Class: Normal    Allergies Fentanyl and Morphine and related  Family History  Problem Relation Age of Onset  . Stroke Mother   . Hypertension  Mother   . Cancer Mother        head and neck cancer and non hodgkin's lymphoma  . Cancer Father        colon/rectal and squamous cell carcinoma skin  . Cancer Paternal Uncle        lung  . Cancer Paternal Grandfather        lymphoma  . Cancer Maternal Aunt        colon cancer  . Cancer Maternal  Grandfather        mesothelioma    Social History Social History  Substance Use Topics  . Smoking status: Former Smoker    Packs/day: 1.00    Years: 39.00    Types: Cigarettes    Quit date: 08/20/2015  . Smokeless tobacco: Never Used  . Alcohol use No    Review of Systems  Constitutional: No fever/chills. Positive generalized weakness and confusion.  Eyes: No visual changes. ENT: No sore throat. Cardiovascular: Denies chest pain. Respiratory: Denies shortness of breath. Gastrointestinal: No abdominal pain. Positive nausea, no vomiting.  No diarrhea.  No constipation. Genitourinary: Negative for dysuria. Musculoskeletal: Positive for back pain. Skin: Negative for rash. Neurological: Negative for headaches, focal weakness or numbness.  10-point ROS otherwise negative.  ____________________________________________   PHYSICAL EXAM:  VITAL SIGNS: ED Triage Vitals  Enc Vitals Group     BP 09/29/16 0605 99/64     Pulse Rate 09/29/16 0605 (!) 113     Resp 09/29/16 0605 18     Temp 09/29/16 0605 97.5 F (36.4 C)     Weight 09/29/16 0643 88 lb (39.9 kg)     Height 09/29/16 0643 5\' 5"  (1.651 m)     Pain Score 09/29/16 0603 8   Constitutional: Alert with some mild confusion. Appears chronically very ill and thin. No acute distress.  Eyes: Conjunctivae are normal.  Head: Atraumatic. Nose: No congestion/rhinnorhea. Mouth/Throat: Mucous membranes are very dry.  Neck: No stridor. Cardiovascular: Sinus tachcyardia. Good peripheral circulation. Grossly normal heart sounds.   Respiratory: Normal respiratory effort.  No retractions. Lungs CTAB. Gastrointestinal: Soft and nontender. No distention.  Musculoskeletal: No lower extremity tenderness nor edema. No gross deformities of extremities. Neurologic:  Normal speech and language. No gross focal neurologic deficits are appreciated.  Skin:  Skin is warm, dry and intact. No rash  noted.  ____________________________________________   LABS (all labs ordered are listed, but only abnormal results are displayed)  Labs Reviewed  COMPREHENSIVE METABOLIC PANEL - Abnormal; Notable for the following:       Result Value   Sodium 132 (*)    Chloride 99 (*)    Calcium 8.4 (*)    Total Protein 5.7 (*)    Albumin 2.7 (*)    ALT 7 (*)    Total Bilirubin 0.2 (*)    All other components within normal limits  CBC - Abnormal; Notable for the following:    RBC 2.48 (*)    Hemoglobin 6.9 (*)    HCT 21.8 (*)    RDW 18.5 (*)    Platelets 98 (*)    All other components within normal limits  URINE CULTURE  LIPASE, BLOOD  PROTIME-INR  URINALYSIS, ROUTINE W REFLEX MICROSCOPIC  VITAMIN B12  FOLATE  IRON AND TIBC  FERRITIN  RETICULOCYTES  LACTATE DEHYDROGENASE  HAPTOGLOBIN  URINALYSIS, ROUTINE W REFLEX MICROSCOPIC  CBG MONITORING, ED  TYPE AND SCREEN  PREPARE RBC (CROSSMATCH)   ____________________________________________  RADIOLOGY  None ____________________________________________   PROCEDURES  Procedure(s) performed:   Procedures  CRITICAL CARE Performed by: Margette Fast Total critical care time: 30 minutes Critical care time was exclusive of separately billable procedures and treating other patients. Critical care was necessary to treat or prevent imminent or life-threatening deterioration. Critical care was time spent personally by me on the following activities: development of treatment plan with patient and/or surrogate as well as nursing, discussions with consultants, evaluation of patient's response to treatment, examination of patient, obtaining history from patient or surrogate, ordering and performing treatments and interventions, ordering and review of laboratory studies, ordering and review of radiographic studies, pulse oximetry and re-evaluation of patient's condition.  Nanda Quinton, MD Emergency  Medicine  ____________________________________________   INITIAL IMPRESSION / ASSESSMENT AND PLAN / ED COURSE  Pertinent labs & imaging results that were available during my care of the patient were reviewed by me and considered in my medical decision making (see chart for details).  Patient resents to the emergency department for evaluation of dehydration, generalized weakness, confusion. Patient completed her sixth cycle of chemotherapy recently and is currently on dexamethasone for delayed nausea symptoms. She is followed closely by Dr. Benay Spice. Family reports similar symptoms of dehydration in the past. She is found to have significantly lower hemoglobin compared to her baseline values. No report of black or grossly bloody bowel movements. Plan for blood transfusion as I think this may be contributing to her symptoms along with IV fluids. I discussed the risks and benefits of blood transfusion in detail. Patient is currently on Lovenox for DVT. No symptoms of chest pain, shortness of breath, hypoxemia to suggest PE. Anticipate admission.   Patient refusing rectal exam. Explained that if anemia is caused by GI bleeding our treatment plan would change. She verbalizes understanding but refuses. Will discuss admission with hospitalist.   09:18 AM Patient agreed to rectal exam. Hemoccult negative. Updated patient and family at bedside regarding result and plan for admission and blood transfusion.   Discussed patient's case with hospitalist, Dr. Candiss Norse. Patient and family (if present) updated with plan. Care transferred to hospitalist service.  I reviewed all nursing notes, vitals, pertinent old records, EKGs, labs, imaging (as available).  ____________________________________________  FINAL CLINICAL IMPRESSION(S) / ED DIAGNOSES  Final diagnoses:  Symptomatic anemia  Disorientation  Dehydration     MEDICATIONS GIVEN DURING THIS VISIT:  Medications  0.9 %  sodium chloride infusion  (not administered)  0.9 %  sodium chloride infusion (not administered)  sodium chloride 0.9 % bolus 1,000 mL (1,000 mLs Intravenous New Bag/Given 09/29/16 0813)   2 Units PRBC transfusion.   NEW OUTPATIENT MEDICATIONS STARTED DURING THIS VISIT:  None   Note:  This document was prepared using Dragon voice recognition software and may include unintentional dictation errors.  Nanda Quinton, MD Emergency Medicine   Long, [facility], MD 09/29/16 [phone]

## 2016-09-30 DIAGNOSIS — E43 Unspecified severe protein-calorie malnutrition: Secondary | ICD-10-CM | POA: Diagnosis not present

## 2016-09-30 DIAGNOSIS — D6481 Anemia due to antineoplastic chemotherapy: Secondary | ICD-10-CM | POA: Diagnosis not present

## 2016-09-30 DIAGNOSIS — T451X5A Adverse effect of antineoplastic and immunosuppressive drugs, initial encounter: Secondary | ICD-10-CM | POA: Diagnosis not present

## 2016-09-30 DIAGNOSIS — G893 Neoplasm related pain (acute) (chronic): Secondary | ICD-10-CM

## 2016-09-30 DIAGNOSIS — C3492 Malignant neoplasm of unspecified part of left bronchus or lung: Secondary | ICD-10-CM

## 2016-09-30 DIAGNOSIS — R410 Disorientation, unspecified: Secondary | ICD-10-CM | POA: Diagnosis not present

## 2016-09-30 DIAGNOSIS — C7951 Secondary malignant neoplasm of bone: Secondary | ICD-10-CM

## 2016-09-30 DIAGNOSIS — R627 Adult failure to thrive: Secondary | ICD-10-CM

## 2016-09-30 LAB — TYPE AND SCREEN
ABO/RH(D): O POS
Antibody Screen: NEGATIVE
UNIT DIVISION: 0
UNIT DIVISION: 0

## 2016-09-30 LAB — BASIC METABOLIC PANEL
ANION GAP: 12 (ref 5–15)
BUN: 16 mg/dL (ref 6–20)
CALCIUM: 8.6 mg/dL — AB (ref 8.9–10.3)
CO2: 22 mmol/L (ref 22–32)
CREATININE: 0.76 mg/dL (ref 0.44–1.00)
Chloride: 100 mmol/L — ABNORMAL LOW (ref 101–111)
GFR calc non Af Amer: >60 mL/min (ref >60)
Glucose, Bld: 86 mg/dL (ref 65–99)
Potassium: 4.5 mmol/L (ref 3.5–5.1)
SODIUM: 134 mmol/L — AB (ref 135–145)

## 2016-09-30 LAB — BPAM RBC
BLOOD PRODUCT EXPIRATION DATE: 201806202359
BLOOD PRODUCT EXPIRATION DATE: 201806202359
ISSUE DATE / TIME: 201805291345
ISSUE DATE / TIME: 201805291656
UNIT TYPE AND RH: 5100
Unit Type and Rh: 5100

## 2016-09-30 LAB — CBC
HEMATOCRIT: 31.9 % — AB (ref 36.0–46.0)
Hemoglobin: 10.5 g/dL — ABNORMAL LOW (ref 12.0–15.0)
MCH: 27.9 pg (ref 26.0–34.0)
MCHC: 32.9 g/dL (ref 30.0–36.0)
MCV: 84.8 fL (ref 78.0–100.0)
Platelets: 91 10*3/uL — ABNORMAL LOW (ref 150–400)
RBC: 3.76 MIL/uL — ABNORMAL LOW (ref 3.87–5.11)
RDW: 17.4 % — AB (ref 11.5–15.5)
WBC: 5.1 10*3/uL (ref 4.0–10.5)

## 2016-09-30 LAB — URINE CULTURE: Culture: NO GROWTH

## 2016-09-30 LAB — HAPTOGLOBIN: Haptoglobin: 454 mg/dL — ABNORMAL HIGH (ref 34–200)

## 2016-09-30 MED ORDER — ALPRAZOLAM 0.25 MG PO TABS
0.2500 mg | ORAL_TABLET | Freq: Three times a day (TID) | ORAL | Status: DC | PRN
  Administered 2016-09-30: 0.25 mg via ORAL
  Filled 2016-09-30: qty 1

## 2016-09-30 MED ORDER — VITAMIN B-12 1000 MCG PO TABS
1000.0000 ug | ORAL_TABLET | Freq: Every day | ORAL | Status: DC
  Administered 2016-09-30: 1000 ug via ORAL
  Filled 2016-09-30 (×2): qty 1

## 2016-09-30 MED ORDER — ALPRAZOLAM 0.25 MG PO TABS
0.2500 mg | ORAL_TABLET | Freq: Three times a day (TID) | ORAL | Status: DC | PRN
Start: 2016-09-30 — End: 2016-10-01
  Administered 2016-09-30: 0.5 mg via ORAL
  Filled 2016-09-30: qty 2

## 2016-09-30 MED ORDER — OXYCODONE HCL 5 MG PO TABS
5.0000 mg | ORAL_TABLET | ORAL | Status: DC | PRN
  Administered 2016-09-30: 10 mg via ORAL
  Filled 2016-09-30: qty 2
  Filled 2016-09-30: qty 1

## 2016-09-30 MED ORDER — HALOPERIDOL LACTATE 5 MG/ML IJ SOLN: 1.0000 mg | Freq: Four times a day (QID) | INTRAMUSCULAR | Status: DC | PRN

## 2016-09-30 MED ORDER — HALOPERIDOL 0.5 MG PO TABS
0.5000 mg | ORAL_TABLET | Freq: Four times a day (QID) | ORAL | Status: DC | PRN
  Filled 2016-09-30: qty 1

## 2016-09-30 NOTE — Progress Notes (Signed)
CM consult for home with hospice. This CM met with sister Erasmo Downer as she is the one in the room and pt is confused. Erasmo Downer confirmed that the pt would be going home with family at discharge. This CM provided Erasmo Downer with a home hospice provider list for family to look over. CM will continue to follow. Marney Doctor RN,BSN,NCM [phone]

## 2016-09-30 NOTE — Progress Notes (Signed)
PROGRESS NOTE  Jenny [name] GQB:[id number] DOB: 04/30/1951 DOA: 09/29/2016 PCP: Eulas Post, MD  HPI/Recap of past 24 hours:  Confused, she has pulled out her IV multiple times, family at bedside  Assessment/Plan: Principal Problem:   Anemia associated with chemotherapy Active Problems:   DVT, lower extremity, recurrent (HCC)   COPD (chronic obstructive pulmonary disease) ([place])   Small cell lung cancer ([place])   Encephalopathy   Protein-calorie malnutrition, severe   Symptomatic anemia   1. Generalized fatigue, mild confusion. - Likely multifactorial including progressive cancer, and chemo related anemia, mild hyponatremia from dehydration.  ua and cxr no acute findings, oncology also report patient had increased requirement of pain meds.  - Hemoccult negative and no indications of acute blood loss.  -S/p ed 2 units of packed RBCs, post transfusion H&H with improvement.   2. 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  3. Severe protein calorie malnutrition with cachexia. Continue protein supplementation. Family declined nutrition consult, they report have had multiple nutrition consult in the past  4. History of DVT. Pharmacy to dose for dose Lovenox which she takes at home.  5. Underlying COPD. No acute issues. Supportive care.  6. FTT: increased fall recently, family declined Pt eval, they are leaning toward home with home hospice.    DVT Prophylaxis Lovenox      Family Communication: daughter and son at bedside.  Code Status DNR  Likely DC to  Home with home hospice in am  Condition GUARDED    Consults called: Oncology informed - Dr Benay Spice     Procedures:  prbc x2units  Antibiotics:  none   Objective: BP 109/66 (BP Location: Right Arm)   Pulse (!) 108   Temp 98.5 F (36.9 C) (Oral)    Resp 18   Ht 5\' 5"  (1.651 m)   Wt 38.4 kg (84 lb 11.2 oz)   SpO2 99%   BMI 14.09 kg/m   Intake/Output Summary (Last 24 hours) at 09/30/16 0801 Last data filed at 09/30/16 0400  Gross per 24 hour  Intake           1458.5 ml  Output              800 ml  Net            658.5 ml   Filed Weights   09/29/16 0643 09/30/16 0121  Weight: 39.9 kg (88 lb) 38.4 kg (84 lb 11.2 oz)    Exam:   General:  Frail, pale, cachectic, chronically ill appearing  Cardiovascular: mild sinus tachycardia  Respiratory: CTABL  Abdomen: Soft/ND/NT, positive BS  Musculoskeletal: No Edema  Neuro: confused  Data Reviewed: Basic Metabolic Panel:  Recent Labs Lab 09/24/16 0826 09/29/16 0638 09/30/16 0422  NA 137 132* 134*  K 4.3 4.7 4.5  CL  --  99* 100*  CO2 23 24 22   GLUCOSE 112 89 86  BUN 13.5 20 16   CREATININE 0.8 0.83 0.76  CALCIUM 9.5 8.4* 8.6*  MG 1.5  --   --    Liver Function Tests:  Recent Labs Lab 09/24/16 0826 09/29/16 0638  AST 13 18  ALT 9 7*  ALKPHOS 158* 106  BILITOT 0.22 0.2*  PROT 6.4 5.7*  ALBUMIN 3.0* 2.7*    Recent Labs Lab 09/29/16 0819  LIPASE 26   No results for input(s): AMMONIA in  the last 168 hours. CBC:  Recent Labs Lab 09/24/16 0827 09/29/16 0638 09/30/16 0422  WBC 5.9 4.3 QUESTIONABLE RESULTS, RECOMMEND RECOLLECT TO VERIFY  NEUTROABS 4.6  --   --   HGB 8.5* 6.9* QUESTIONABLE RESULTS, RECOMMEND RECOLLECT TO VERIFY  HCT 26.8* 21.8* QUESTIONABLE RESULTS, RECOMMEND RECOLLECT TO VERIFY  MCV 87.1 87.9 QUESTIONABLE RESULTS, RECOMMEND RECOLLECT TO VERIFY  PLT 166 98* QUESTIONABLE RESULTS, RECOMMEND RECOLLECT TO VERIFY   Cardiac Enzymes:   No results for input(s): CKTOTAL, CKMB, CKMBINDEX, TROPONINI in the last 168 hours. BNP (last 3 results) No results for input(s): BNP in the last 8760 hours.  ProBNP (last 3 results) No results for input(s): PROBNP in the last 8760 hours.  CBG:  Recent Labs Lab 09/29/16 0635  GLUCAP 82    No  results found for this or any previous visit (from the past 240 hour(s)).   Studies: Dg Chest Port 1 View  Result Date: 09/29/2016 CLINICAL DATA:  Lung cancer. EXAM: PORTABLE CHEST 1 VIEW COMPARISON:  CT 08/20/2016.  Chest x-ray 05/17/2016 . FINDINGS: Mediastinum is stable. Persistent left suprahilar density and very interstitial interstitial prominence. Findings may be related to treatment. Pneumonitis cannot be excluded. Pleural thickening on the left noted most likely related to scarring. Small effusion cannot be excluded. No acute bony abnormality identified. Stable mild sclerotic changes right humeral head . IMPRESSION: 1. Left suprahilar density and perihilar interstitial prominence possibly related to radiation treatment for the patient's known lung cancer. Pneumonitis cannot be excluded. 2. Left-sided pleural thickening, most likely related scarring. Small left pleural effusion cannot be excluded Electronically Signed   By: [place]   On: 09/29/2016 10:24    Scheduled Meds: . enoxaparin (LOVENOX) injection  1.5 mg/kg Subcutaneous Q24H  . feeding supplement (ENSURE ENLIVE)  237 mL Oral BID BM  . guaiFENesin  600 mg Oral BID  . oxyCODONE  40 mg Oral Q12H  . pantoprazole  40 mg Oral BID  . senna-docusate  1 tablet Oral BID    Continuous Infusions: . sodium chloride       Time spent: 18mins  Shallen Luedke MD, PhD  [facility] Pager [phone]. If 7PM-7AM, please contact night-coverage at [email or web address], password [facility] 09/30/2016, 8:01 AM  LOS: 0 days

## 2016-09-30 NOTE — Progress Notes (Signed)
Today Jenny [name] ate  The toppings of a slice of pizza and stated "I am starving". She continues  to deny foods and to drink.Marland Kitchen I

## 2016-09-30 NOTE — Progress Notes (Signed)
I have offered Jenny [name] multiple drinks  Today and given her a variety of flavors. She refuses to eat or drink

## 2016-09-30 NOTE — Progress Notes (Signed)
I gave [place] 240 ml of apple juice and I ask  Jenny [name] husband to let me know how much she drinks. I will give the family paper and pen to write down Jenny [name] food and fluids

## 2016-09-30 NOTE — Progress Notes (Signed)
Jenny [name] was sitting in a chair in the room attempting to "use the bathroom". I attempted to to re-orient her to the bathroom and she walked and sat on the bed. I helped her to the bathroom and she began to pull her pants down and sit on the trash can I attempted to help her move to the toilet . She became argumentative , as she began to sit on the trash can I assisted her to the floor.

## 2016-09-30 NOTE — Progress Notes (Addendum)
IP PROGRESS NOTE  Subjective:   Jenny [name] is well-known to me with a history of extensive stage small cell lung cancer. She is currently being treated with salvage irinotecan/cisplatin chemotherapy, last given 09/24/2016. She was admitted 09/29/2016 with failure to thrive and altered mental status. She was found to have severe anemia. She was transfused packed blood cells.  Her brother is present this morning. He reports that she has been more confused over the past several days. She has increased pain.  Objective: Vital signs in last 24 hours: Blood pressure 109/66, pulse (!) 108, temperature 98.5 F (36.9 C), temperature source Oral, resp. rate 18, height 5\' 5"  (1.651 m), weight 84 lb 11.2 oz (38.4 kg), SpO2 99 %.  Intake/Output from previous day: 05/29 0701 - 05/30 0700 In: 1458.5 [P.O.:120; Blood:338.5; IV Piggyback:1000] Out: 800 [Urine:800]  Physical Exam:  HEENT: No thrush Lungs: Clear bilaterally Cardiac: Regular rate and rhythm, tachycardia Abdomen: No hepatosplenomegaly, nontender Extremities: No leg edema Musculoskeletal: Mass at the right low posterior chest, multiple scalp masses Neurologic: Alert, follows commands, not oriented    Lab Results:  Recent Labs  09/30/16 0422 09/30/16 1016  WBC QUESTIONABLE RESULTS, RECOMMEND RECOLLECT TO VERIFY 5.1  HGB QUESTIONABLE RESULTS, RECOMMEND RECOLLECT TO VERIFY 10.5*  HCT QUESTIONABLE RESULTS, RECOMMEND RECOLLECT TO VERIFY 31.9*  PLT QUESTIONABLE RESULTS, RECOMMEND RECOLLECT TO VERIFY 91*    BMET  Recent Labs  09/29/16 0638 09/30/16 0422  NA 132* 134*  K 4.7 4.5  CL 99* 100*  CO2 24 22  GLUCOSE 89 86  BUN 20 16  CREATININE 0.83 0.76  CALCIUM 8.4* 8.6*    Studies/Results: Dg Chest Port 1 View  Result Date: 09/29/2016 CLINICAL DATA:  Lung cancer. EXAM: PORTABLE CHEST 1 VIEW COMPARISON:  CT 08/20/2016.  Chest x-ray 05/17/2016 . FINDINGS: Mediastinum is stable. Persistent left suprahilar density and very  interstitial interstitial prominence. Findings may be related to treatment. Pneumonitis cannot be excluded. Pleural thickening on the left noted most likely related to scarring. Small effusion cannot be excluded. No acute bony abnormality identified. Stable mild sclerotic changes right humeral head . IMPRESSION: 1. Left suprahilar density and perihilar interstitial prominence possibly related to radiation treatment for the patient's known lung cancer. Pneumonitis cannot be excluded. 2. Left-sided pleural thickening, most likely related scarring. Small left pleural effusion cannot be excluded Electronically Signed   By: Marcello Moores  Register   On: 09/29/2016 10:24    Medications: I have reviewed the patient's current medications.  Assessment/Plan: 1.Limited stage small cell lung cancer, diagnosed on biopsy of a subcarinal lymph node and left lung mass by EBUSon 09/12/2015  PET scan 09/03/2015 confirmed a hypermetabolic left perihilar mass, 2 left lower lobe pulmonary masses, and mediastinal lymphadenopathy, no evidence of distant metastatic disease  Brain MRI negative for metastatic disease 09/13/2015  Status post chest radiation 09/26/2015 through 10/18/2015  Cycle 1 cisplatin/etoposide beginning 09/26/2015  Cycle 2 cisplatin/etoposide beginning 10/16/2015  Cycle 3 carboplatin/etoposide beginning 11/06/2015  12/17/2015 MRI of the brain negative  12/17/2015 CT scans chest/abdomen/pelvis (report currently not available)  Bone scan 12/27/2015 with minimal increased uptake in the posterior aspect of the right 11th rib that does not appear abnormal on the CT images of August 2017; sclerotic focus noted on the previous CT scan in the body of L1 showed no abnormal uptake on the current bone scan; asymmetric increased uptake in the left SI joint region possibly corresponding to a subtle sclerotic focus in the medial posterior aspect of the left iliac bone  CT chest 04/07/2016-new liver metastases, new  upper abdominal adenopathy, left iliac metastasis, new right posterior lateral Pleural/chest wall metastasis  Cycle 1 salvage chemotherapy with etoposide/carboplatin01/12/2016  Cycle 2 salvage chemotherapy with etoposide/carboplatin 06/09/2016-Neulasta added  Restaging CTs of the head, chest, abdomen, and pelvis on 07/06/2016-progressive skull metastases with intracranial/extracranial extension, stable lung nodules, progressive disease in the liver, bones, enlargement of a right chest wall mass, and enlarging peritoneal nodule  Cycle 1 irinotecan/cisplatin 07/13/2016  Cycle 2 irinotecan/cisplatin 07/27/2016  Cycle 3 irinotecan/cisplatin 08/10/2016  CT right arm 08/14/2016-metastatic lesion proximal right humeral shaft with a moth-eatenappearance. At risk for pathologic fracture. Metastatic lesion in the proximal right radial shaft with apparent extension into adjacent soft tissues.  CT chest 08/20/2016-overall stable disease, mixed response with a decrease in the right chest wall mass and a lingular nodule, slight increase in several of the liver lesions  Cycle 4 irinotecan/cisplatin 08/28/2016  Cycle 5 irinotecan/cisplatin 09/11/2016  Cycle 6 irinotecan/cisplatin 09/24/2016  2. 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 Weight loss secondary to #2  4. History of febrile neutropenia following chemotherapy  5. Remote history of a right leg DVT-2008?-Maintained on Lovenox  6. History of tobacco use  7. Admission to [facility] 10/27/2015 with neutropenic fever, esophagitis, and dehydration  8. Hypercalcemia 05/01/2016-status post intravenous fluids and Zometa12/29/2017  9. Fever prior to hospital admission 05/17/2016-infectious versus tumor fever; treated for pneumonia, respiratory panel positive for coronavirus  10. 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 Altered mental status-potentially related to infection/delirium, concern for brain metastases; brain CT 05/19/2016-no brain metastases, multiple skull metastases; improved 05/28/2016, 06/22/2016.  12. History of Leukopenia/thrombocytopenia secondary to chemotherapy-Neulasta added with cycle 2 etoposide/carboplatin  13. Tachycardia/hypotension-persistent, likely secondary to dehydration  Jenny [name] has extensive stage small cell lung cancer. She is admitted with failure to thrive, confusion, and severe anemia. The confusion may be related to polypharmacy. The OxyContin dose was recently increased. She has a history of hypercalcemia, but the calcium level is now normal. The confusion may be related to delirium associated with malnutrition and critical illness.  I discussed the situation with her brother. Her overall performance status has declined over the past several weeks. I recommend home Hospice care. He will discuss this with her husband.  Recommendations: 1. Hold OxyContin 2. I will have further discussions with her family regarding home Hospice care.  I appreciate the care from the hospitalist service. I will continue following her while in the hospital.    LOS: 0 days   Donneta Romberg, MD   09/30/2016, 3:27 PM   Further discussion with husband and sister.  They report mental status change is acute over 1-2 days and correlates with increased use of oxycodone.  They are not ready to discuss hospice. We will decrease the oxycodone dose, hold oxycontin, and continue xanax for agitation.  B12 is low, will start replacement.

## 2016-10-01 DIAGNOSIS — D6481 Anemia due to antineoplastic chemotherapy: Secondary | ICD-10-CM | POA: Diagnosis not present

## 2016-10-01 DIAGNOSIS — R627 Adult failure to thrive: Secondary | ICD-10-CM | POA: Diagnosis not present

## 2016-10-01 DIAGNOSIS — T451X5A Adverse effect of antineoplastic and immunosuppressive drugs, initial encounter: Secondary | ICD-10-CM | POA: Diagnosis not present

## 2016-10-01 DIAGNOSIS — E43 Unspecified severe protein-calorie malnutrition: Secondary | ICD-10-CM | POA: Diagnosis not present

## 2016-10-01 DIAGNOSIS — R410 Disorientation, unspecified: Secondary | ICD-10-CM | POA: Diagnosis not present

## 2016-10-01 LAB — CBC WITH DIFFERENTIAL/PLATELET
BASOS ABS: 0 10*3/uL (ref 0.0–0.1)
BASOS PCT: 0 %
EOS ABS: 0 10*3/uL (ref 0.0–0.7)
Eosinophils Relative: 1 %
HCT: 31.1 % — ABNORMAL LOW (ref 36.0–46.0)
HEMOGLOBIN: 10.1 g/dL — AB (ref 12.0–15.0)
Lymphocytes Relative: 14 %
Lymphs Abs: 0.6 10*3/uL — ABNORMAL LOW (ref 0.7–4.0)
MCH: 27.7 pg (ref 26.0–34.0)
MCHC: 32.5 g/dL (ref 30.0–36.0)
MCV: 85.4 fL (ref 78.0–100.0)
MONOS PCT: 12 %
Monocytes Absolute: 0.5 10*3/uL (ref 0.1–1.0)
NEUTROS PCT: 72 %
Neutro Abs: 2.8 10*3/uL (ref 1.7–7.7)
Platelets: 73 10*3/uL — ABNORMAL LOW (ref 150–400)
RBC: 3.64 MIL/uL — ABNORMAL LOW (ref 3.87–5.11)
RDW: 17.3 % — ABNORMAL HIGH (ref 11.5–15.5)
WBC: 3.9 10*3/uL — AB (ref 4.0–10.5)

## 2016-10-01 LAB — BASIC METABOLIC PANEL
ANION GAP: 9 (ref 5–15)
BUN: 14 mg/dL (ref 6–20)
CALCIUM: 8.5 mg/dL — AB (ref 8.9–10.3)
CHLORIDE: 101 mmol/L (ref 101–111)
CO2: 25 mmol/L (ref 22–32)
CREATININE: 0.67 mg/dL (ref 0.44–1.00)
GFR calc non Af Amer: >60 mL/min (ref >60)
Glucose, Bld: 99 mg/dL (ref 65–99)
Potassium: 4 mmol/L (ref 3.5–5.1)
SODIUM: 135 mmol/L (ref 135–145)

## 2016-10-01 LAB — MAGNESIUM: MAGNESIUM: 1.2 mg/dL — AB (ref 1.7–2.4)

## 2016-10-01 MED ORDER — MAGNESIUM OXIDE 400 (241.3 MG) MG PO TABS: 400.0000 mg | ORAL_TABLET | Freq: Every day | ORAL | 0 refills | Status: AC

## 2016-10-01 MED ORDER — CYANOCOBALAMIN 1000 MCG PO TABS: 1000.0000 ug | ORAL_TABLET | Freq: Every day | ORAL | 0 refills | Status: AC

## 2016-10-01 MED ORDER — MAGNESIUM OXIDE 400 (241.3 MG) MG PO TABS
400.0000 mg | ORAL_TABLET | Freq: Every day | ORAL | Status: DC
  Filled 2016-10-01: qty 1

## 2016-10-01 NOTE — Progress Notes (Signed)
IP PROGRESS NOTE  Subjective:   Jenny [name] is up in the chair this morning. She was able to eat last night. She denies pain at present. Her family has noted improvement in the confusion, but she is not at baseline. She took only 1 dose of oxycodone last night.  Objective: Vital signs in last 24 hours: Blood pressure 109/70, pulse (!) 110, temperature 98.1 F (36.7 C), temperature source Oral, resp. rate 14, height 5\' 5"  (1.651 m), weight 84 lb 11.2 oz (38.4 kg), SpO2 95 %.  Intake/Output from previous day: 05/30 0701 - 05/31 0700 In: 200 [P.O.:200] Out: 500 [Urine:500]  Physical Exam:  HEENT: No thrush Lungs: Moves air bilaterally, no respiratory distress Cardiac: Regular rate and rhythm, tachycardia Abdomen: No hepatosplenomegaly, nontender Extremities: No leg edema Neurologic: Alert, follows commands, oriented to place. She can name her brother and husband. Not oriented to year    Lab Results:  Recent Labs  09/30/16 1016 10/01/16 0424  WBC 5.1 3.9*  HGB 10.5* 10.1*  HCT 31.9* 31.1*  PLT 91* 73*    BMET  Recent Labs  09/30/16 0422 10/01/16 0424  NA 134* 135  K 4.5 4.0  CL 100* 101  CO2 22 25  GLUCOSE 86 99  BUN 16 14  CREATININE 0.76 0.67  CALCIUM 8.6* 8.5*    Studies/Results: Dg Chest Port 1 View  Result Date: 09/29/2016 CLINICAL DATA:  Lung cancer. EXAM: PORTABLE CHEST 1 VIEW COMPARISON:  CT 08/20/2016.  Chest x-ray 05/17/2016 . FINDINGS: Mediastinum is stable. Persistent left suprahilar density and very interstitial interstitial prominence. Findings may be related to treatment. Pneumonitis cannot be excluded. Pleural thickening on the left noted most likely related to scarring. Small effusion cannot be excluded. No acute bony abnormality identified. Stable mild sclerotic changes right humeral head . IMPRESSION: 1. Left suprahilar density and perihilar interstitial prominence possibly related to radiation treatment for the patient's known lung cancer.  Pneumonitis cannot be excluded. 2. Left-sided pleural thickening, most likely related scarring. Small left pleural effusion cannot be excluded Electronically Signed   By: Marcello Moores  Register   On: 09/29/2016 10:24    Medications: I have reviewed the patient's current medications.  Assessment/Plan: 1.Limited stage small cell lung cancer, diagnosed on biopsy of a subcarinal lymph node and left lung mass by EBUSon 09/12/2015  PET scan 09/03/2015 confirmed a hypermetabolic left perihilar mass, 2 left lower lobe pulmonary masses, and mediastinal lymphadenopathy, no evidence of distant metastatic disease  Brain MRI negative for metastatic disease 09/13/2015  Status post chest radiation 09/26/2015 through 10/18/2015  Cycle 1 cisplatin/etoposide beginning 09/26/2015  Cycle 2 cisplatin/etoposide beginning 10/16/2015  Cycle 3 carboplatin/etoposide beginning 11/06/2015  12/17/2015 MRI of the brain negative  12/17/2015 CT scans chest/abdomen/pelvis (report currently not available)  Bone scan 12/27/2015 with minimal increased uptake in the posterior aspect of the right 11th rib that does not appear abnormal on the CT images of August 2017; sclerotic focus noted on the previous CT scan in the body of L1 showed no abnormal uptake on the current bone scan; asymmetric increased uptake in the left SI joint region possibly corresponding to a subtle sclerotic focus in the medial posterior aspect of the left iliac bone  CT chest 04/07/2016-new liver metastases, new upper abdominal adenopathy, left iliac metastasis, new right posterior lateral Pleural/chest wall metastasis  Cycle 1 salvage chemotherapy with etoposide/carboplatin01/12/2016  Cycle 2 salvage chemotherapy with etoposide/carboplatin 06/09/2016-Neulasta added  Restaging CTs of the head, chest, abdomen, and pelvis on 07/06/2016-progressive skull metastases with  intracranial/extracranial extension, stable lung nodules, progressive disease in the  liver, bones, enlargement of a right chest wall mass, and enlarging peritoneal nodule  Cycle 1 irinotecan/cisplatin 07/13/2016  Cycle 2 irinotecan/cisplatin 07/27/2016  Cycle 3 irinotecan/cisplatin 08/10/2016  CT right arm 08/14/2016-metastatic lesion proximal right humeral shaft with a moth-eatenappearance. At risk for pathologic fracture. Metastatic lesion in the proximal right radial shaft with apparent extension into adjacent soft tissues.  CT chest 08/20/2016-overall stable disease, mixed response with a decrease in the right chest wall mass and a lingular nodule, slight increase in several of the liver lesions  Cycle 4 irinotecan/cisplatin 08/28/2016  Cycle 5 irinotecan/cisplatin 09/11/2016  Cycle 6 irinotecan/cisplatin 09/24/2016  2. 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 Weight loss secondary to #2  4. History of febrile neutropenia following chemotherapy  5. Remote history of a right leg DVT-2008?-Maintained on Lovenox  6. History of tobacco use  7. Admission to [facility] 10/27/2015 with neutropenic fever, esophagitis, and dehydration  8. Hypercalcemia 05/01/2016-status post intravenous fluids and Zometa12/29/2017  9. Fever prior to hospital admission 05/17/2016-infectious versus tumor fever; treated for pneumonia, respiratory panel positive for coronavirus  10. 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 Altered mental status-potentially related to infection/delirium, concern for brain metastases; brain CT 05/19/2016-no brain metastases, multiple skull metastases; improved 05/28/2016, 06/22/2016.  12. History of Leukopenia/thrombocytopenia secondary to chemotherapy-Neulasta added with cycle 2 etoposide/carboplatin  13. Tachycardia/hypotension-persistent, multifactorial  Jenny [name] continues to  have confusion, but appears improved. I suspect the confusion was related to increased dosing of oxycodone. I discussed the situation with her brother and husband. I think she is stable for discharge to home as long as she has someone with her at all times. She is scheduled for a follow-up appointment at the [facility] for IV fluids on 10/02/2016.  There is no obvious evidence of progressive small cell lung cancer. I have a low clinical suspicion for CNS metastases.   Recommendations: 1. Hold OxyContin 2. Continue oxycodone at a dose of 5-10 milligrams every 4 hours as needed for pain 3. Follow-up at the [facility] as scheduled on 10/02/2016 and 10/09/2016 4. Continue vitamin B-12 replacement  I appreciate the care from Dr. Erlinda Hong.   LOS: 0 days   Donneta Romberg, MD   10/01/2016, 8:16 AM   Further discussion with husband and sister.  They report mental status change is acute over 1-2 days and correlates with increased use of oxycodone.  They are not ready to discuss hospice. We will decrease the oxycodone dose, hold oxycontin, and continue xanax for agitation.  B12 is low, will start replacement.

## 2016-10-01 NOTE — Progress Notes (Signed)
Jenny [name] refused all of her medications after I asked her the date ,day and year and her pain scale. She did not know the year or date or day/ She started to cry when I told her the year and day.She refused her pain med even tho she had ask for it

## 2016-10-01 NOTE — Progress Notes (Signed)
Per MD notes, family not ready for hospice. Orders written for HHPT/RN. This CM met with pt and husband at bedside for home health choice. [facility] chosen and [facility] rep contacted for referral. No other CM needs communicated. Marney Doctor RN,BSN,NCM [phone]

## 2016-10-01 NOTE — Discharge Summary (Signed)
Discharge Summary  Jenny [name] TMH:[id number] DOB: 1950-12-05  PCP: Eulas Post, MD  Admit date: 09/29/2016 Discharge date: 10/01/2016  Time spent: <64mins  Recommendations for Outpatient Follow-up:  1. F/u with oncology dr Learta Codding   Discharge Diagnoses:  Active Hospital Problems   Diagnosis Date Noted  . Anemia associated with chemotherapy 05/17/2016  . Symptomatic anemia 09/29/2016  . Protein-calorie malnutrition, severe 05/19/2016  . Encephalopathy 05/17/2016  . Small cell lung cancer ([place]) 09/18/2015  . COPD (chronic obstructive pulmonary disease) ([place]) 08/28/2015  . 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    Discharge Condition: stable  Diet recommendation: regular diet  Filed Weights   09/29/16 9211 09/30/16 0121  Weight: 39.9 kg (88 lb) 38.4 kg (84 lb 11.2 oz)    History of present illness:  Jenny [name]  is a 66 y.o. female, With history of left-sided small cell lung cancer undergoing chemotherapy, under the care of Dr. Malachy Mood, history of DVT on Lovenox, history of chemotherapy-induced anemia requiring outpatient transfusion, remote history of smoking, COPD, severe protein calorie malnutrition, anxiety brought in by family members with chief complaints of not feeling well, feeling weak and slightly confused, came to the ER where workup suggested symptomatic anemia and I was called to admit.  Patient denies any fever or chills, no headache, no chest or abdominal pain, no productive cough or shortness of breath, she is mildly constipated but no blood in stool or urine, no dark stools, no focal weakness, no skin rashes or bruises, no new joint pains or aches.  Hospital Course:  Principal Problem:   Anemia associated with chemotherapy Active Problems:   DVT, lower extremity, recurrent (HCC)   COPD (chronic obstructive pulmonary disease) (HCC)  Small cell lung cancer (HCC)   Encephalopathy   Protein-calorie malnutrition, severe   Symptomatic anemia    Generalized fatigue, mild confusion. /encephalopathy - Likely multifactorial including progressive cancer, and chemo related anemia, mild hyponatremia from dehydration.  ua and cxr no acute findings, oncology also reports patient had increased requirement of pain meds.  - Hemoccult negative and no indications of acute blood loss.  -S/p ed 2 units of packed RBCs, post transfusion H&H with improvement. Home meds oxycontin discontinued, she is continued on prn oxycodone.   Left-sided small cell lung cancer with calvarium mets, progressive decline, family discussed with Dr Learta Codding, they want go home with home health and continue cancer therapy if patient is able to tolerate.  Severe protein calorie malnutrition with cachexia.  Body mass index is 14.09 kg/m. Continue protein supplementation. Family declined nutrition consult, they report have had multiple nutrition consult in the past  Hypomagnesemia: replace mag.  Low b12, start b12 supplement.  History of DVT. Pharmacy to dose for dose Lovenox which she takes at home.  Underlying COPD. No acute issues. Supportive care.  FTT: increased fall recently, son and daughter declined Pt eval, they are leaning toward home with home hospice. Husband does not want hospice, they want to go home with home health.      Family Communication:husband at bedside  Code Status DNR  DC to Home with home health   Condition GUARDED   Consults called:Oncology informed - Dr Benay Spice   Procedures:  prbc x2units  Antibiotics:  none   Discharge Exam: BP 109/70 (BP Location: Left Arm)   Pulse (!) 110   Temp 98.1 F (36.7 C) (Oral)  Resp 14   Ht 5\' 5"  (1.651 m)   Wt 38.4 kg (84 lb 11.2 oz)   SpO2 95%   BMI 14.09 kg/m    General:  Frail, pale, cachectic, chronically ill appearing  Cardiovascular: mild  sinus tachycardia  Respiratory: CTABL  Abdomen: Soft/ND/NT, positive BS  Musculoskeletal: No Edema  Neuro: confused, states it is 1999,    Discharge Instructions You were cared for by a hospitalist during your hospital stay. If you have any questions about your discharge medications or the care you received while you were in the hospital after you are discharged, you can call the unit and asked to speak with the hospitalist on call if the hospitalist that took care of you is not available. Once you are discharged, your primary care physician will handle any further medical issues. Please note that NO REFILLS for any discharge medications will be authorized once you are discharged, as it is imperative that you return to your primary care physician (or establish a relationship with a primary care physician if you do not have one) for your aftercare needs so that they can reassess your need for medications and monitor your lab values.  Discharge Instructions    Face-to-face encounter (required for Medicare/Medicaid patients)    Complete by:  As directed    I Amanat Hackel certify that this patient is under my care and that I, or a nurse practitioner or physician's assistant working with me, had a face-to-face encounter that meets the physician face-to-face encounter requirements with this patient on 10/01/2016. The encounter with the patient was in whole, or in part for the following medical condition(s) which is the primary reason for home health care (List medical condition): FTT   The encounter with the patient was in whole, or in part, for the following medical condition, which is the primary reason for home health care:  FTT   I certify that, based on my findings, the following services are medically necessary home health services:   Nursing Physical therapy     Reason for Medically Necessary Home Health Services:  Skilled Nursing- Change/Decline in Patient Status   My clinical findings support the  need for the above services:  Pain interferes with ambulation/mobility   Further, I certify that my clinical findings support that this patient is homebound due to:  Mental confusion   Home Health    Complete by:  As directed    To provide the following care/treatments:   PT RN       Allergies as of 10/01/2016      Reactions   Fentanyl    Patient prefers to never take this medication again. She doesn't like the way it makes her feel.    Morphine And Related Itching, Rash      Medication List    TAKE these medications   acetaminophen 500 MG tablet Commonly known as:  TYLENOL Take 500 mg by mouth every 8 (eight) hours as needed for mild pain.   ALPRAZolam 0.25 MG tablet Commonly known as:  XANAX Take 1 tablet (0.25 mg total) by mouth daily as needed for anxiety or sleep.   cyanocobalamin 1000 MCG tablet Take 1 tablet (1,000 mcg total) by mouth daily.   dexamethasone 4 MG tablet Commonly known as:  DECADRON Take 1 tablet (4 mg total) by mouth 2 (two) times daily with a meal. Begin the day after chemotherapy   diphenoxylate-atropine 2.5-0.025 MG tablet Commonly known as:  LOMOTIL Take 2 tablets by mouth 4 (  four) times daily as needed for diarrhea or loose stools.   enoxaparin 80 MG/0.8ML injection Commonly known as:  LOVENOX Inject 0.65 mLs (65 mg total) into the skin daily.   feeding supplement (ENSURE ENLIVE) Liqd Take 237 mLs by mouth 2 (two) times daily between meals.   guaiFENesin 600 MG 12 hr tablet Commonly known as:  MUCINEX Take 1 tablet (600 mg total) by mouth 2 (two) times daily.   magnesium oxide 400 (241.3 Mg) MG tablet Commonly known as:  MAG-OX Take 1 tablet (400 mg total) by mouth daily.   multivitamin with minerals Tabs tablet Take 1 tablet by mouth daily.   ondansetron 8 MG disintegrating tablet Commonly known as:  ZOFRAN-ODT Take 1 tablet (8 mg total) by mouth every 8 (eight) hours as needed for nausea or vomiting.   oxyCODONE 5 MG immediate  release tablet Commonly known as:  Oxy IR/ROXICODONE Take 2-3 tablets (10-15 mg total) by mouth every 4 (four) hours as needed for severe pain. What changed:  Another medication with the same name was removed. Continue taking this medication, and follow the directions you see here.   polyethylene glycol packet Commonly known as:  MIRALAX Take 17 g by mouth daily as needed for mild constipation.   prochlorperazine 10 MG tablet Commonly known as:  COMPAZINE Take 1 tablet (10 mg total) by mouth every 6 (six) hours as needed for nausea or vomiting.   senna-docusate 8.6-50 MG tablet Commonly known as:  Senokot-S Take 1 tablet by mouth 2 (two) times daily.      Allergies  Allergen Reactions  . Fentanyl     Patient prefers to never take this medication again. She doesn't like the way it makes her feel.   . Morphine And Related Itching and Rash   Follow-up Information    Ladell Pier, MD Follow up on 10/02/2016.   Specialty:  Oncology Contact information: [place] [address] [phone]            The results of significant diagnostics from this hospitalization (including imaging, microbiology, ancillary and laboratory) are listed below for reference.    Significant Diagnostic Studies: Dg Chest Port 1 View  Result Date: 09/29/2016 CLINICAL DATA:  Lung cancer. EXAM: PORTABLE CHEST 1 VIEW COMPARISON:  CT 08/20/2016.  Chest x-ray 05/17/2016 . FINDINGS: Mediastinum is stable. Persistent left suprahilar density and very interstitial interstitial prominence. Findings may be related to treatment. Pneumonitis cannot be excluded. Pleural thickening on the left noted most likely related to scarring. Small effusion cannot be excluded. No acute bony abnormality identified. Stable mild sclerotic changes right humeral head . IMPRESSION: 1. Left suprahilar density and perihilar interstitial prominence possibly related to radiation treatment for the patient's known  lung cancer. Pneumonitis cannot be excluded. 2. Left-sided pleural thickening, most likely related scarring. Small left pleural effusion cannot be excluded Electronically Signed   By: Marcello Moores  Register   On: 09/29/2016 10:24    Microbiology: Recent Results (from the past 240 hour(s))  Urine culture     Status: None   Collection Time: 09/29/16  4:42 PM  Result Value Ref Range Status   Specimen Description URINE, CLEAN CATCH  Final   Special Requests NONE  Final   Culture   Final    NO GROWTH Performed at [facility], [address] [place], [place] [address]    Report Status 09/30/2016 FINAL  Final     Labs: Basic Metabolic Panel:  Recent Labs Lab  09/29/16 0638 09/30/16 0422 10/01/16 0424  NA 132* 134* 135  K 4.7 4.5 4.0  CL 99* 100* 101  CO2 24 22 25   GLUCOSE 89 86 99  BUN 20 16 14   CREATININE 0.83 0.76 0.67  CALCIUM 8.4* 8.6* 8.5*  MG  --   --  1.2*   Liver Function Tests:  Recent Labs Lab 09/29/16 0638  AST 18  ALT 7*  ALKPHOS 106  BILITOT 0.2*  PROT 5.7*  ALBUMIN 2.7*    Recent Labs Lab 09/29/16 0819  LIPASE 26   No results for input(s): AMMONIA in the last 168 hours. CBC:  Recent Labs Lab 09/29/16 0638 09/30/16 0422 09/30/16 1016 10/01/16 0424  WBC 4.3 QUESTIONABLE RESULTS, RECOMMEND RECOLLECT TO VERIFY 5.1 3.9*  NEUTROABS  --   --   --  2.8  HGB 6.9* QUESTIONABLE RESULTS, RECOMMEND RECOLLECT TO VERIFY 10.5* 10.1*  HCT 21.8* QUESTIONABLE RESULTS, RECOMMEND RECOLLECT TO VERIFY 31.9* 31.1*  MCV 87.9 QUESTIONABLE RESULTS, RECOMMEND RECOLLECT TO VERIFY 84.8 85.4  PLT 98* QUESTIONABLE RESULTS, RECOMMEND RECOLLECT TO VERIFY 91* 73*   Cardiac Enzymes: No results for input(s): CKTOTAL, CKMB, CKMBINDEX, TROPONINI in the last 168 hours. BNP: BNP (last 3 results) No results for input(s): BNP in the last 8760 hours.  ProBNP (last 3 results) No results for input(s): PROBNP in the last 8760 hours.  CBG:  Recent Labs Lab 09/29/16 0635    GLUCAP 82       Signed:  Gianella Chismar MD, PhD  [facility] 10/01/2016, 9:25 AM

## 2016-10-02 ENCOUNTER — Telehealth: Payer: Self-pay

## 2016-10-02 ENCOUNTER — Other Ambulatory Visit: Payer: Self-pay | Admitting: *Deleted

## 2016-10-02 ENCOUNTER — Ambulatory Visit (HOSPITAL_BASED_OUTPATIENT_CLINIC_OR_DEPARTMENT_OTHER): Payer: BLUE CROSS/BLUE SHIELD

## 2016-10-02 ENCOUNTER — Other Ambulatory Visit: Payer: Self-pay | Admitting: Nurse Practitioner

## 2016-10-02 VITALS — BP 103/72 | HR 88 | Temp 97.8°F | Resp 16

## 2016-10-02 DIAGNOSIS — C3492 Malignant neoplasm of unspecified part of left bronchus or lung: Secondary | ICD-10-CM | POA: Diagnosis not present

## 2016-10-02 DIAGNOSIS — C3490 Malignant neoplasm of unspecified part of unspecified bronchus or lung: Secondary | ICD-10-CM

## 2016-10-02 DIAGNOSIS — Z9189 Other specified personal risk factors, not elsewhere classified: Secondary | ICD-10-CM

## 2016-10-02 MED ORDER — OXYCODONE HCL 5 MG PO TABS: 5.0000 mg | ORAL_TABLET | ORAL | 0 refills | Status: DC | PRN

## 2016-10-02 MED ORDER — OXYCODONE-ACETAMINOPHEN 5-325 MG PO TABS
2.0000 | ORAL_TABLET | Freq: Once | ORAL | Status: AC
  Administered 2016-10-02: 2 via ORAL

## 2016-10-02 MED ORDER — SODIUM CHLORIDE 0.9 % IV SOLN
INTRAVENOUS | Status: AC
  Administered 2016-10-02: 09:00:00 via INTRAVENOUS

## 2016-10-02 NOTE — Patient Instructions (Signed)
Dehydration, Adult Dehydration is a condition in which there is not enough fluid or water in the body. This happens when you lose more fluids than you take in. Important organs, such as the kidneys, brain, and heart, cannot function without a proper amount of fluids. Any loss of fluids from the body can lead to dehydration. Dehydration can range from mild to severe. This condition should be treated right away to prevent it from becoming severe. What are the causes? This condition may be caused by:  Vomiting.  Diarrhea.  Excessive sweating, such as from heat exposure or exercise.  Not drinking enough fluid, especially: ? When ill. ? While doing activity that requires a lot of energy.  Excessive urination.  Fever.  Infection.  Certain medicines, such as medicines that cause the body to lose excess fluid (diuretics).  Inability to access safe drinking water.  Reduced physical ability to get adequate water and food.  What increases the risk? This condition is more likely to develop in people:  Who have a poorly controlled long-term (chronic) illness, such as diabetes, heart disease, or kidney disease.  Who are age 65 or older.  Who are disabled.  Who live in a place with high altitude.  Who play endurance sports.  What are the signs or symptoms? Symptoms of mild dehydration may include:  Thirst.  Dry lips.  Slightly dry mouth.  Dry, warm skin.  Dizziness. Symptoms of moderate dehydration may include:  Very dry mouth.  Muscle cramps.  Dark urine. Urine may be the color of tea.  Decreased urine production.  Decreased tear production.  Heartbeat that is irregular or faster than normal (palpitations).  Headache.  Light-headedness, especially when you stand up from a sitting position.  Fainting (syncope). Symptoms of severe dehydration may include:  Changes in skin, such as: ? Cold and clammy skin. ? Blotchy (mottled) or pale skin. ? Skin that does  not quickly return to normal after being lightly pinched and released (poor skin turgor).  Changes in body fluids, such as: ? Extreme thirst. ? No tear production. ? Inability to sweat when body temperature is high, such as in hot weather. ? Very little urine production.  Changes in vital signs, such as: ? Weak pulse. ? Pulse that is more than 100 beats a minute when sitting still. ? Rapid breathing. ? Low blood pressure.  Other changes, such as: ? Sunken eyes. ? Cold hands and feet. ? Confusion. ? Lack of energy (lethargy). ? Difficulty waking up from sleep. ? Short-term weight loss. ? Unconsciousness. How is this diagnosed? This condition is diagnosed based on your symptoms and a physical exam. Blood and urine tests may be done to help confirm the diagnosis. How is this treated? Treatment for this condition depends on the severity. Mild or moderate dehydration can often be treated at home. Treatment should be started right away. Do not wait until dehydration becomes severe. Severe dehydration is an emergency and it needs to be treated in a hospital. Treatment for mild dehydration may include:  Drinking more fluids.  Replacing salts and minerals in your blood (electrolytes) that you may have lost. Treatment for moderate dehydration may include:  Drinking an oral rehydration solution (ORS). This is a drink that helps you replace fluids and electrolytes (rehydrate). It can be found at pharmacies and retail stores. Treatment for severe dehydration may include:  Receiving fluids through an IV tube.  Receiving an electrolyte solution through a feeding tube that is passed through your nose   and into your stomach (nasogastric tube, or NG tube).  Correcting any abnormalities in electrolytes.  Treating the underlying cause of dehydration. Follow these instructions at home:  If directed by your health care provider, drink an ORS: ? Make an ORS by following instructions on the  package. ? Start by drinking small amounts, about  cup (120 mL) every 5-10 minutes. ? Slowly increase how much you drink until you have taken the amount recommended by your health care provider.  Drink enough clear fluid to keep your urine clear or pale yellow. If you were told to drink an ORS, finish the ORS first, then start slowly drinking other clear fluids. Drink fluids such as: ? Water. Do not drink only water. Doing that can lead to having too little salt (sodium) in the body (hyponatremia). ? Ice chips. ? Fruit juice that you have added water to (diluted fruit juice). ? Low-calorie sports drinks.  Avoid: ? Alcohol. ? Drinks that contain a lot of sugar. These include high-calorie sports drinks, fruit juice that is not diluted, and soda. ? Caffeine. ? Foods that are greasy or contain a lot of fat or sugar.  Take over-the-counter and prescription medicines only as told by your health care provider.  Do not take sodium tablets. This can lead to having too much sodium in the body (hypernatremia).  Eat foods that contain a healthy balance of electrolytes, such as bananas, oranges, potatoes, tomatoes, and spinach.  Keep all follow-up visits as told by your health care provider. This is important. Contact a health care provider if:  You have abdominal pain that: ? Gets worse. ? Stays in one area (localizes).  You have a rash.  You have a stiff neck.  You are more irritable than usual.  You are sleepier or more difficult to wake up than usual.  You feel weak or dizzy.  You feel very thirsty.  You have urinated only a small amount of very dark urine over 6-8 hours. Get help right away if:  You have symptoms of severe dehydration.  You cannot drink fluids without vomiting.  Your symptoms get worse with treatment.  You have a fever.  You have a severe headache.  You have vomiting or diarrhea that: ? Gets worse. ? Does not go away.  You have blood or green matter  (bile) in your vomit.  You have blood in your stool. This may cause stool to look black and tarry.  You have not urinated in 6-8 hours.  You faint.  Your heart rate while sitting still is over 100 beats a minute.  You have trouble breathing. This information is not intended to replace advice given to you by your health care provider. Make sure you discuss any questions you have with your health care provider. Document Released: 04/20/2005 Document Revised: 11/15/2015 Document Reviewed: 06/14/2015 Elsevier Interactive Patient Education  2018 [employer]vier [employer]  

## 2016-10-02 NOTE — Telephone Encounter (Signed)
Called pt to inform her Oxycodone script is ready for pick up. Dose has been decreased to 1-2 tablets Q4hours PRN. She voiced understanding.

## 2016-10-02 NOTE — Telephone Encounter (Signed)
Husband called for oxycodone refill. They only have 5 tabs left.  Last rx was 09/24/16 for #120 2-3 tabs q4hr prn.

## 2016-10-04 ENCOUNTER — Other Ambulatory Visit: Payer: Self-pay | Admitting: Oncology

## 2016-10-07 ENCOUNTER — Telehealth: Payer: Self-pay | Admitting: *Deleted

## 2016-10-07 ENCOUNTER — Telehealth: Payer: Self-pay | Admitting: Hematology & Oncology

## 2016-10-07 ENCOUNTER — Telehealth: Payer: Self-pay

## 2016-10-07 NOTE — Telephone Encounter (Signed)
Pt called stating that when she received fluids last week she was supposed to call if she felt it did not improve her condition. She is calling 1) she feels her pain level is not being managed. 2) she has more pain than before 3) maybe need a different drug  Her oxycontin was increased to 40 at [facility].  it was reduced when she was discharged from the hospital to 20 because she was increased lethargic and confused at 40 mg.   She is on short term 1-2 q4 hr. And she is using every 4 hours.   Pain level 6/10 now, 8-9/10 at worst, 3/10 is her goal. It is same pain as before.

## 2016-10-07 NOTE — Telephone Encounter (Signed)
Message received from [place] with [facility] physical therapy requesting a verbal order from Dr. Benay Spice for home PT for 1-2 times a week for 4-6 weeks for patient.  Call placed back to [facility] and message left on her private cell phone with verbal order from Dr. Benay Spice regarding her request for home PT.  Instructed Kelly to call [facility] back with any further questions.

## 2016-10-07 NOTE — Telephone Encounter (Signed)
I received a call from the answering service. They called Re: Jenny [name]. She is having more pain. It sounds like from what they told me she was admitted or discharge from hospital last week because of overdosing on pain medicine.  I looked in the discharge summary and I did not see anything that looked she had overdosed. The liver she was anemic. She was transfused. She was on OxyContin and oxycodone. The OxyContin was discontinued.  I told the answering service that they can tell her that she have one extra 5 mg of oxycodone at night. Apparently, her husband has a pain medicine under lock and key. He will not be home until later. As such, she cannot get access to her pain medicine until he gets home.  The answering service will speak to her family to let them know that when her husband gets home that he can give her one extra 5 mg oxycodone.  It looks like she is declining. I don't want to see her suffer. However, she does not need to and up having too much pain medication in her system again.  Lattie Haw, MD

## 2016-10-07 NOTE — Telephone Encounter (Signed)
Call placed back to patient and unable to reach her at her home or cell number.  Message left on her private cell phone and pt instructed to call [place] back as soon as possible.

## 2016-10-08 ENCOUNTER — Telehealth: Payer: Self-pay | Admitting: *Deleted

## 2016-10-08 DIAGNOSIS — C3490 Malignant neoplasm of unspecified part of unspecified bronchus or lung: Secondary | ICD-10-CM

## 2016-10-08 MED ORDER — OXYCODONE HCL 5 MG PO TABS: 5.0000 mg | ORAL_TABLET | ORAL | 0 refills | Status: DC | PRN

## 2016-10-08 NOTE — Telephone Encounter (Addendum)
Call placed to patient regarding pain.  Patient states that she is currently taking Oxycontin 20 mg twice a day and Oxycodone 5 mg 1-2 tablets every 4 hrs as needed and that pain continues at a 6/10 to back.  Patient is currently with her mother, Peg and she confirms doses of Oxycontin and Oxycodone as patient stated.  Patient states that she does not feel as though she is able to come in today for office visit.  Informed patient that I would speak with Dr. Benay Spice and call her back with further orders.

## 2016-10-08 NOTE — Telephone Encounter (Signed)
Call placed back to patient to inform her per order of Dr. Benay Spice to increase dose of Oxycodone to 5-15 mg every 4 hrs as needed. Teach back done.   Dose confirmed with patient's mother, Peg.  Patient appreciative of call back and states that she will be here at [facility] for tomorrow's scheduled appointments.

## 2016-10-09 ENCOUNTER — Ambulatory Visit (HOSPITAL_BASED_OUTPATIENT_CLINIC_OR_DEPARTMENT_OTHER): Payer: BLUE CROSS/BLUE SHIELD

## 2016-10-09 ENCOUNTER — Other Ambulatory Visit: Payer: Self-pay | Admitting: *Deleted

## 2016-10-09 ENCOUNTER — Telehealth: Payer: Self-pay | Admitting: Family Medicine

## 2016-10-09 ENCOUNTER — Ambulatory Visit (HOSPITAL_BASED_OUTPATIENT_CLINIC_OR_DEPARTMENT_OTHER): Payer: BLUE CROSS/BLUE SHIELD | Admitting: Oncology

## 2016-10-09 ENCOUNTER — Other Ambulatory Visit: Payer: Self-pay | Admitting: Nurse Practitioner

## 2016-10-09 ENCOUNTER — Other Ambulatory Visit (HOSPITAL_BASED_OUTPATIENT_CLINIC_OR_DEPARTMENT_OTHER): Payer: BLUE CROSS/BLUE SHIELD

## 2016-10-09 VITALS — Wt 84.8 lb

## 2016-10-09 VITALS — BP 128/90 | HR 121 | Temp 97.8°F | Resp 18 | Ht 65.0 in

## 2016-10-09 DIAGNOSIS — C7951 Secondary malignant neoplasm of bone: Secondary | ICD-10-CM

## 2016-10-09 DIAGNOSIS — C3492 Malignant neoplasm of unspecified part of left bronchus or lung: Secondary | ICD-10-CM

## 2016-10-09 DIAGNOSIS — C3490 Malignant neoplasm of unspecified part of unspecified bronchus or lung: Secondary | ICD-10-CM

## 2016-10-09 DIAGNOSIS — Z5111 Encounter for antineoplastic chemotherapy: Secondary | ICD-10-CM | POA: Diagnosis not present

## 2016-10-09 DIAGNOSIS — M549 Dorsalgia, unspecified: Secondary | ICD-10-CM

## 2016-10-09 DIAGNOSIS — R109 Unspecified abdominal pain: Secondary | ICD-10-CM

## 2016-10-09 LAB — CBC WITH DIFFERENTIAL/PLATELET
BASO%: 0.1 % (ref 0.0–2.0)
Basophils Absolute: 0 10*3/uL (ref 0.0–0.1)
EOS%: 0.1 % (ref 0.0–7.0)
Eosinophils Absolute: 0 10*3/uL (ref 0.0–0.5)
HCT: 37.5 % (ref 34.8–46.6)
HGB: 11.9 g/dL (ref 11.6–15.9)
LYMPH%: 4.3 % — ABNORMAL LOW (ref 14.0–49.7)
MCH: 27.9 pg (ref 25.1–34.0)
MCHC: 31.7 g/dL (ref 31.5–36.0)
MCV: 87.8 fL (ref 79.5–101.0)
MONO#: 1.4 10*3/uL — ABNORMAL HIGH (ref 0.1–0.9)
MONO%: 15.3 % — AB (ref 0.0–14.0)
NEUT%: 80.2 % — ABNORMAL HIGH (ref 38.4–76.8)
NEUTROS ABS: 7.3 10*3/uL — AB (ref 1.5–6.5)
NRBC: 0 % (ref 0–0)
Platelets: 85 10*3/uL — ABNORMAL LOW (ref 145–400)
RBC: 4.27 10*6/uL (ref 3.70–5.45)
RDW: 17.1 % — ABNORMAL HIGH (ref 11.2–14.5)
WBC: 9.1 10*3/uL (ref 3.9–10.3)
lymph#: 0.4 10*3/uL — ABNORMAL LOW (ref 0.9–3.3)

## 2016-10-09 LAB — COMPREHENSIVE METABOLIC PANEL
ALT: 16 U/L (ref 0–55)
AST: 29 U/L (ref 5–34)
Albumin: 2.7 g/dL — ABNORMAL LOW (ref 3.5–5.0)
Alkaline Phosphatase: 278 U/L — ABNORMAL HIGH (ref 40–150)
Anion Gap: 13 mEq/L — ABNORMAL HIGH (ref 3–11)
BILIRUBIN TOTAL: 0.45 mg/dL (ref 0.20–1.20)
BUN: 16.2 mg/dL (ref 7.0–26.0)
CHLORIDE: 99 meq/L (ref 98–109)
CO2: 24 meq/L (ref 22–29)
Calcium: 9.9 mg/dL (ref 8.4–10.4)
Creatinine: 0.8 mg/dL (ref 0.6–1.1)
EGFR: 78 mL/min/{1.73_m2} — AB (ref >90)
GLUCOSE: 138 mg/dL (ref 70–140)
POTASSIUM: 4.9 meq/L (ref 3.5–5.1)
SODIUM: 136 meq/L (ref 136–145)
TOTAL PROTEIN: 6.7 g/dL (ref 6.4–8.3)

## 2016-10-09 LAB — MAGNESIUM: Magnesium: 1.4 mg/dl — CL (ref 1.5–2.5)

## 2016-10-09 MED ORDER — OXYCODONE-ACETAMINOPHEN 5-325 MG PO TABS
2.0000 | ORAL_TABLET | Freq: Once | ORAL | Status: AC
  Administered 2016-10-09: 2 via ORAL

## 2016-10-09 MED ORDER — OXYCODONE HCL 5 MG PO TABS: 5.0000 mg | ORAL_TABLET | ORAL | 0 refills | Status: DC | PRN

## 2016-10-09 MED ORDER — SODIUM CHLORIDE 0.9 % IV SOLN
Freq: Once | INTRAVENOUS | Status: AC
  Administered 2016-10-09: 11:00:00 via INTRAVENOUS

## 2016-10-09 MED ORDER — POTASSIUM CHLORIDE 2 MEQ/ML IV SOLN
Freq: Once | INTRAVENOUS | Status: AC
  Administered 2016-10-09: 11:00:00 via INTRAVENOUS
  Filled 2016-10-09: qty 10

## 2016-10-09 MED ORDER — ACETAMINOPHEN 325 MG PO TABS
ORAL_TABLET | ORAL | Status: AC
  Filled 2016-10-09: qty 2

## 2016-10-09 MED ORDER — OXYCODONE HCL ER 10 MG PO T12A: 10.0000 mg | EXTENDED_RELEASE_TABLET | Freq: Two times a day (BID) | ORAL | 0 refills | Status: DC

## 2016-10-09 MED ORDER — SODIUM CHLORIDE 0.9 % IV SOLN
Freq: Once | INTRAVENOUS | Status: AC
  Administered 2016-10-09: 14:00:00 via INTRAVENOUS
  Filled 2016-10-09: qty 5

## 2016-10-09 MED ORDER — PALONOSETRON HCL INJECTION 0.25 MG/5ML
0.2500 mg | Freq: Once | INTRAVENOUS | Status: AC
  Administered 2016-10-09: 0.25 mg via INTRAVENOUS

## 2016-10-09 MED ORDER — ACETAMINOPHEN 325 MG PO TABS
650.0000 mg | ORAL_TABLET | Freq: Once | ORAL | Status: AC
  Administered 2016-10-09: 650 mg via ORAL

## 2016-10-09 MED ORDER — DEXTROSE 5 % IV SOLN
65.0000 mg/m2 | Freq: Once | INTRAVENOUS | Status: AC
  Administered 2016-10-09: 80 mg via INTRAVENOUS
  Filled 2016-10-09: qty 4

## 2016-10-09 MED ORDER — CISPLATIN CHEMO INJECTION 100MG/100ML
30.0000 mg/m2 | Freq: Once | INTRAVENOUS | Status: AC
  Administered 2016-10-09: 41 mg via INTRAVENOUS
  Filled 2016-10-09: qty 41

## 2016-10-09 MED ORDER — ATROPINE SULFATE 1 MG/ML IJ SOLN
0.5000 mg | Freq: Once | INTRAMUSCULAR | Status: AC | PRN
  Administered 2016-10-09: 0.5 mg via INTRAVENOUS

## 2016-10-09 NOTE — Patient Instructions (Signed)
[facility] Discharge Instructions for Patients Receiving Chemotherapy  Today you received the following chemotherapy agents: Irinotecan and Cisplatin   To help prevent nausea and vomiting after your treatment, we encourage you to take your nausea medication as directed.    If you develop nausea and vomiting that is not controlled by your nausea medication, call the clinic.   BELOW ARE SYMPTOMS THAT SHOULD BE REPORTED IMMEDIATELY:  *FEVER GREATER THAN 100.5 F  *CHILLS WITH OR WITHOUT FEVER  NAUSEA AND VOMITING THAT IS NOT CONTROLLED WITH YOUR NAUSEA MEDICATION  *UNUSUAL SHORTNESS OF BREATH  *UNUSUAL BRUISING OR BLEEDING  TENDERNESS IN MOUTH AND THROAT WITH OR WITHOUT PRESENCE OF ULCERS  *URINARY PROBLEMS  *BOWEL PROBLEMS  UNUSUAL RASH Items with * indicate a potential emergency and should be followed up as soon as possible.  Feel free to call the clinic you have any questions or concerns. The clinic phone number is [phone].  Please show the [place] at check-in to the Emergency Department and triage nurse.

## 2016-10-09 NOTE — Progress Notes (Signed)
OK to treat with platelet count of 85 per Dr. Benay Spice.

## 2016-10-09 NOTE — Progress Notes (Signed)
Per Lorriane Shire, RN per Dr. Benay Spice okay to treat with heart rate of 121.   Per Lorriane Shire, RN per Dr. Benay Spice okay to proceed with Irinotecan infusion with 193mls of urine output.   This RN noted slight swelling above patient's right wrist IV. Irinotecan stopped and cool compress applied. New IV started. No swelling, redness or irritation noted at previous IV site. Patient instructed to call if irritation is noted at the site.   Patient able to produce 170mls more of urine. Per Lorriane Shire, RN per Dr Benay Spice, able to proceed with Cisplatin and okay to run Cisplatin fluids with Cisplatin.

## 2016-10-09 NOTE — Progress Notes (Signed)
[place] OFFICE PROGRESS NOTE   Diagnosis: Small cell lung cancer   INTERVAL HISTORY:   Jenny [name] returns as scheduled. She was discharged from the hospital 10/01/2016 after admission with altered mental status. The mental status change was felt to be secondary to narcotics. She had increased the dose of OxyContin and oxycodone over the days prior to hospital admission. OxyContin has been on hold. She presents today with multiple family members. They report her mental status is improved. She continues to have pain in the abdomen and back. She is taking oxycodone for pain. She takes oxycodone approximately every 4 hours. The abdominal pain is worse after eating. She feels the scalp masses have not changed.  She is ambulatory in the home. She dresses herself and uses the restroom independently.  Objective:  Vital signs in last 24 hours:  Blood pressure 128/90, pulse (!) 121, temperature 97.8 F (36.6 C), temperature source Oral, resp. rate 18, height 5\' 5"  (1.651 m).    HEENT: No thrush  Resp: Lungs clear bilaterally  Cardio: Regular rate and rhythm  GI: Slight fullness in the right compared to the left upper abdomen without a discrete liver edge or mass.  Vascular: No leg edema  Neuro:Alert and oriented, the tongue deviates to the left  Skin:Multiple scalp masses appear unchanged   Lab Results:  Lab Results  Component Value Date   WBC 9.1 10/09/2016   HGB 11.9 10/09/2016   HCT 37.5 10/09/2016   MCV 87.8 10/09/2016   PLT 85 (L) 10/09/2016   NEUTROABS 7.3 (H) 10/09/2016      Imaging:  No results found.  Medications: I have reviewed the patient's current medications.  Assessment/Plan: 1. Limited stage small cell lung cancer, diagnosed on biopsy of a subcarinal lymph node and left lung mass by [facility] 09/12/2015  PET scan 09/03/2015 confirmed a hypermetabolic left perihilar mass, 2 left lower lobe pulmonary masses, and mediastinal lymphadenopathy, no  evidence of distant metastatic disease  Brain MRI negative for metastatic disease 09/13/2015  Status post chest radiation 09/26/2015 through 10/18/2015  Cycle 1 cisplatin/etoposide beginning 09/26/2015  Cycle 2 cisplatin/etoposide beginning 10/16/2015  Cycle 3 carboplatin/etoposide beginning 11/06/2015  12/17/2015 MRI of the brain negative  12/17/2015 CT scans chest/abdomen/pelvis (report currently not available)  Bone scan 12/27/2015 with minimal increased uptake in the posterior aspect of the right 11th rib that does not appear abnormal on the CT images of August 2017; sclerotic focus noted on the previous CT scan in the body of L1 showed no abnormal uptake on the current bone scan; asymmetric increased uptake in the left SI joint region possibly corresponding to a subtle sclerotic focus in the medial posterior aspect of the left iliac bone  CT chest 04/07/2016-new liver metastases, new upper abdominal adenopathy, left iliac metastasis, new right posterior lateral Pleural/chest wall metastasis  Cycle 1 salvage chemotherapy with etoposide/carboplatin01/12/2016  Cycle 2 salvage chemotherapy with etoposide/carboplatin 06/09/2016-Neulasta added  Restaging CTs of the head, chest, abdomen, and pelvis on 07/06/2016-progressive skull metastases with intracranial/extracranial extension, stable lung nodules, progressive disease in the liver, bones, enlargement of a right chest wall mass, and enlarging peritoneal nodule  Cycle 1 irinotecan/cisplatin 07/13/2016  Cycle 2 irinotecan/cisplatin 07/27/2016  Cycle 3 irinotecan/cisplatin 08/10/2016  CT right arm 08/14/2016-metastatic lesion proximal right humeral shaft with a moth-eatenappearance. At risk for pathologic fracture. Metastatic lesion in the proximal right radial shaft with apparent extension into adjacent soft tissues.  Status post pelvic radiation to the proximal right humerus and proximal right  radius/ulna/distal humerus in 1  fraction 08/26/2016   CT chest 08/20/2016-overall stable disease, mixed response with a decrease in the right chest wall mass and a lingular nodule, slight increase in several of the liver lesions  Cycle 4 irinotecan/cisplatin 08/28/2016  Cycle 5 irinotecan/cisplatin 09/11/2016  Cycle 6 irinotecan/cisplatin 09/24/2016  Cycle 7 irinotecan/cisplatin 10/09/2016  2. 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 Weight loss secondary to #2  4. History of febrile neutropenia following chemotherapy  5. Remote history of a right leg DVT-2008?-Maintained on Lovenox  6. History of tobacco use  7. Admission to [facility] 10/27/2015 with neutropenic fever, esophagitis, and dehydration  8. Hypercalcemia 05/01/2016-status post intravenous fluids and Zometa12/29/2017  9. Fever prior to hospital admission 05/17/2016-infectious versus tumor fever; treated for pneumonia, respiratory panel positive for coronavirus  10. 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 Altered mental status-potentially related to infection/delirium, concern for brain metastases; brain CT 05/19/2016-no brain metastases, multiple skull metastases; improved 05/28/2016, 06/22/2016.  12. History of Leukopenia/thrombocytopenia secondary to chemotherapy-Neulasta added with cycle 2 etoposide/carboplatin  13. Tachycardia/hypotension-persistent, multifactorial   Disposition:  Her overall status appears unchanged. I discussed the risk/benefit of continuing chemotherapy with Jenny [name] and her family. She feels she is benefiting from the chemotherapy. There is no clear evidence of disease progression. She continues to have a borderline performance status to receive chemotherapy.  We discussed pain control issues. We decided to resume OxyContin at a dose of 10 mg twice  daily. She'll continue oxycodone as needed for breakthrough pain. Her family will contact us if she has confusion.  Jenny [name] will return for IV fluids on 10/13/2016. She will be scheduled for an office and lab visit on 10/16/2016.  25 minutes were spent with the patient today. The majority of the time was used for counseling and coordination of care.  Donneta Romberg, MD  10/09/2016  9:32 AM

## 2016-10-09 NOTE — Telephone Encounter (Signed)
Pt refused Skill Nursing Thursday and Friday and asked the nurse to call back on Monday so that she can resume her visits.

## 2016-10-12 ENCOUNTER — Telehealth: Payer: Self-pay | Admitting: *Deleted

## 2016-10-12 ENCOUNTER — Other Ambulatory Visit: Payer: Self-pay | Admitting: *Deleted

## 2016-10-12 ENCOUNTER — Ambulatory Visit (HOSPITAL_BASED_OUTPATIENT_CLINIC_OR_DEPARTMENT_OTHER): Payer: BLUE CROSS/BLUE SHIELD

## 2016-10-12 DIAGNOSIS — C3490 Malignant neoplasm of unspecified part of unspecified bronchus or lung: Secondary | ICD-10-CM

## 2016-10-12 MED ORDER — SODIUM CHLORIDE 0.9 % IV SOLN
INTRAVENOUS | Status: AC
  Administered 2016-10-12: 16:00:00 via INTRAVENOUS

## 2016-10-12 MED ORDER — OXYCODONE-ACETAMINOPHEN 5-325 MG PO TABS
2.0000 | ORAL_TABLET | Freq: Once | ORAL | Status: AC
  Administered 2016-10-12: 2 via ORAL

## 2016-10-12 NOTE — Patient Instructions (Signed)
Dehydration, Adult Dehydration is when there is not enough fluid or water in your body. This happens when you lose more fluids than you take in. Dehydration can range from mild to very bad. It should be treated right away to keep it from getting very bad. Symptoms of mild dehydration may include:  Thirst.  Dry lips.  Slightly dry mouth.  Dry, warm skin.  Dizziness. Symptoms of moderate dehydration may include:  Very dry mouth.  Muscle cramps.  Dark pee (urine). Pee may be the color of tea.  Your body making less pee.  Your eyes making fewer tears.  Heartbeat that is uneven or faster than normal (palpitations).  Headache.  Light-headedness, especially when you stand up from sitting.  Fainting (syncope). Symptoms of very bad dehydration may include:  Changes in skin, such as: ? Cold and clammy skin. ? Blotchy (mottled) or pale skin. ? Skin that does not quickly return to normal after being lightly pinched and let go (poor skin turgor).  Changes in body fluids, such as: ? Feeling very thirsty. ? Your eyes making fewer tears. ? Not sweating when body temperature is high, such as in hot weather. ? Your body making very little pee.  Changes in vital signs, such as: ? Weak pulse. ? Pulse that is more than 100 beats a minute when you are sitting still. ? Fast breathing. ? Low blood pressure.  Other changes, such as: ? Sunken eyes. ? Cold hands and feet. ? Confusion. ? Lack of energy (lethargy). ? Trouble waking up from sleep. ? Short-term weight loss. ? Unconsciousness. Follow these instructions at home:  If told by your doctor, drink an ORS: ? Make an ORS by using instructions on the package. ? Start by drinking small amounts, about  cup (120 mL) every 5-10 minutes. ? Slowly drink more until you have had the amount that your doctor said to have.  Drink enough clear fluid to keep your pee clear or pale yellow. If you were told to drink an ORS, finish the ORS  first, then start slowly drinking clear fluids. Drink fluids such as: ? Water. Do not drink only water by itself. Doing that can make the salt (sodium) level in your body get too low (hyponatremia). ? Ice chips. ? Fruit juice that you have added water to (diluted). ? Low-calorie sports drinks.  Avoid: ? Alcohol. ? Drinks that have a lot of sugar. These include high-calorie sports drinks, fruit juice that does not have water added, and soda. ? Caffeine. ? Foods that are greasy or have a lot of fat or sugar.  Take over-the-counter and prescription medicines only as told by your doctor.  Do not take salt tablets. Doing that can make the salt level in your body get too high (hypernatremia).  Eat foods that have minerals (electrolytes). Examples include bananas, oranges, potatoes, tomatoes, and spinach.  Keep all follow-up visits as told by your doctor. This is important. Contact a doctor if:  You have belly (abdominal) pain that: ? Gets worse. ? Stays in one area (localizes).  You have a rash.  You have a stiff neck.  You get angry or annoyed more easily than normal (irritability).  You are more sleepy than normal.  You have a harder time waking up than normal.  You feel: ? Weak. ? Dizzy. ? Very thirsty.  You have peed (urinated) only a small amount of very dark pee during 6-8 hours. Get help right away if:  You have symptoms of   very bad dehydration.  You cannot drink fluids without throwing up (vomiting).  Your symptoms get worse with treatment.  You have a fever.  You have a very bad headache.  You are throwing up or having watery poop (diarrhea) and it: ? Gets worse. ? Does not go away.  You have blood or something green (bile) in your throw-up.  You have blood in your poop (stool). This may cause poop to look black and tarry.  You have not peed in 6-8 hours.  You pass out (faint).  Your heart rate when you are sitting still is more than 100 beats a  minute.  You have trouble breathing. This information is not intended to replace advice given to you by your health care provider. Make sure you discuss any questions you have with your health care provider. Document Released: 02/14/2009 Document Revised: 11/08/2015 Document Reviewed: 06/14/2015 Elsevier Interactive Patient Education  2018 [employer]vier Inc.  

## 2016-10-12 NOTE — Telephone Encounter (Signed)
OK 

## 2016-10-12 NOTE — Progress Notes (Signed)
Spoke with desk RN, Lavella Lemons, to express patient's desire of a port-a-cath placement. Lavella Lemons, RN voiced understanding.   Wylene Simmer, BSN, RN 10/12/2016 3:57 PM

## 2016-10-12 NOTE — Telephone Encounter (Signed)
I called Jenny [name] and informed her of the message below.

## 2016-10-12 NOTE — Telephone Encounter (Signed)
Coordinated with infusion charge RN: called pt with IV fluids appt for 2:30 today. She will need to go to scheduling to set up next office/treatment and IV fluids for 6/15.

## 2016-10-13 ENCOUNTER — Telehealth: Payer: Self-pay

## 2016-10-13 ENCOUNTER — Ambulatory Visit: Payer: Self-pay

## 2016-10-13 NOTE — Telephone Encounter (Signed)
[name] RN with [facility] for skilled nursing called to let Dr Benay Spice know that the pt refused her entry to the house last week and today. Today her brother answered the door and said she did not want to see anyone, she was in the bathroom throwing up. Jenny Reichmann tried to encourage brother and pt  to let cindy  see the pt especially since she was throwing up but was unable to.  Called pt. Pt thought it was PT coming to see her. "I am so sick". She can't hold her head up b/c it hurts. She is nauseated and vomiting. She did use her zofran ODT about 1130 today. She has diarrhea this AM x2. She was vomiting yesterday when she got home from [facility], she was not vomiting at [facility]. She was at [facility] for fluids. She has vomited once or twice "badly".  She had irinotecan and cisplatin on 6/8. Her next OV with Lisa/ [facility] is 6/15.  Instructed her to go to ER if she gets worse tonight. Instructed her to call in the morning to give an update.

## 2016-10-15 ENCOUNTER — Encounter (HOSPITAL_COMMUNITY): Payer: Self-pay | Admitting: Emergency Medicine

## 2016-10-15 ENCOUNTER — Emergency Department (HOSPITAL_COMMUNITY)
Admission: EM | Admit: 2016-10-15 | Discharge: 2016-10-15 | Disposition: A | Discharge status: Home / Self Care | Payer: BLUE CROSS/BLUE SHIELD | Attending: Emergency Medicine | Admitting: Emergency Medicine

## 2016-10-15 ENCOUNTER — Emergency Department (HOSPITAL_COMMUNITY): Payer: BLUE CROSS/BLUE SHIELD

## 2016-10-15 DIAGNOSIS — G9340 Encephalopathy, unspecified: Secondary | ICD-10-CM

## 2016-10-15 DIAGNOSIS — Z87891 Personal history of nicotine dependence: Secondary | ICD-10-CM | POA: Insufficient documentation

## 2016-10-15 DIAGNOSIS — C3490 Malignant neoplasm of unspecified part of unspecified bronchus or lung: Secondary | ICD-10-CM | POA: Diagnosis not present

## 2016-10-15 DIAGNOSIS — R4182 Altered mental status, unspecified: Secondary | ICD-10-CM | POA: Diagnosis present

## 2016-10-15 DIAGNOSIS — R410 Disorientation, unspecified: Secondary | ICD-10-CM | POA: Diagnosis not present

## 2016-10-15 DIAGNOSIS — Z85118 Personal history of other malignant neoplasm of bronchus and lung: Secondary | ICD-10-CM | POA: Diagnosis not present

## 2016-10-15 LAB — COMPREHENSIVE METABOLIC PANEL
ALK PHOS: 176 U/L — AB (ref 38–126)
ALT: 14 U/L (ref 14–54)
AST: 27 U/L (ref 15–41)
Albumin: 3.1 g/dL — ABNORMAL LOW (ref 3.5–5.0)
Anion gap: 12 (ref 5–15)
BILIRUBIN TOTAL: 0.6 mg/dL (ref 0.3–1.2)
BUN: 25 mg/dL — AB (ref 6–20)
CO2: 23 mmol/L (ref 22–32)
Calcium: 8.9 mg/dL (ref 8.9–10.3)
Chloride: 98 mmol/L — ABNORMAL LOW (ref 101–111)
Creatinine, Ser: 0.84 mg/dL (ref 0.44–1.00)
GFR calc Af Amer: >60 mL/min (ref >60)
GFR calc non Af Amer: >60 mL/min (ref >60)
GLUCOSE: 104 mg/dL — AB (ref 65–99)
POTASSIUM: 4.4 mmol/L (ref 3.5–5.1)
SODIUM: 133 mmol/L — AB (ref 135–145)
TOTAL PROTEIN: 7.1 g/dL (ref 6.5–8.1)

## 2016-10-15 LAB — URINALYSIS, ROUTINE W REFLEX MICROSCOPIC
BACTERIA UA: NONE SEEN
Bilirubin Urine: NEGATIVE
Glucose, UA: NEGATIVE mg/dL
Hgb urine dipstick: NEGATIVE
KETONES UR: NEGATIVE mg/dL
Nitrite: NEGATIVE
PROTEIN: NEGATIVE mg/dL
Specific Gravity, Urine: 1.016 (ref 1.005–1.030)
pH: 5 (ref 5.0–8.0)

## 2016-10-15 LAB — CBC WITH DIFFERENTIAL/PLATELET
Basophils Absolute: 0 10*3/uL (ref 0.0–0.1)
Basophils Relative: 0 %
EOS PCT: 2 %
Eosinophils Absolute: 0.1 10*3/uL (ref 0.0–0.7)
HEMATOCRIT: 34.3 % — AB (ref 36.0–46.0)
Hemoglobin: 11.2 g/dL — ABNORMAL LOW (ref 12.0–15.0)
LYMPHS PCT: 12 %
Lymphs Abs: 0.3 10*3/uL — ABNORMAL LOW (ref 0.7–4.0)
MCH: 28.2 pg (ref 26.0–34.0)
MCHC: 32.7 g/dL (ref 30.0–36.0)
MCV: 86.4 fL (ref 78.0–100.0)
MONO ABS: 0.5 10*3/uL (ref 0.1–1.0)
MONOS PCT: 18 %
Neutro Abs: 1.9 10*3/uL (ref 1.7–7.7)
Neutrophils Relative %: 68 %
PLATELETS: 57 10*3/uL — AB (ref 150–400)
RBC: 3.97 MIL/uL (ref 3.87–5.11)
RDW: 16.9 % — AB (ref 11.5–15.5)
WBC: 2.8 10*3/uL — ABNORMAL LOW (ref 4.0–10.5)

## 2016-10-15 LAB — MAGNESIUM: Magnesium: 1.4 mg/dL — ABNORMAL LOW (ref 1.7–2.4)

## 2016-10-15 MED ORDER — ONDANSETRON HCL 4 MG/2ML IJ SOLN
4.0000 mg | Freq: Once | INTRAMUSCULAR | Status: AC
  Administered 2016-10-15: 4 mg via INTRAVENOUS
  Filled 2016-10-15: qty 2

## 2016-10-15 MED ORDER — OXYCODONE HCL 5 MG PO TABS
10.0000 mg | ORAL_TABLET | Freq: Once | ORAL | Status: AC
  Administered 2016-10-15: 10 mg via ORAL
  Filled 2016-10-15: qty 2

## 2016-10-15 MED ORDER — SODIUM CHLORIDE 0.9 % IV BOLUS (SEPSIS)
1000.0000 mL | Freq: Once | INTRAVENOUS | Status: AC
  Administered 2016-10-15: 1000 mL via INTRAVENOUS

## 2016-10-15 MED ORDER — MAGNESIUM SULFATE 2 GM/50ML IV SOLN
2.0000 g | Freq: Once | INTRAVENOUS | Status: AC
Start: 2016-10-15 — End: 2016-10-15
  Administered 2016-10-15: 2 g via INTRAVENOUS
  Filled 2016-10-15: qty 50

## 2016-10-15 NOTE — H&P (Addendum)
Medical Consultation   Jenny [name]  FTD:[id number]  DOB: 07/12/1950  DOA: 10/15/2016  PCP: Eulas Post, MD  Outpatient Specialists: Dr. Benay Spice oncology   Requesting physician: Dr. Florina Ou  Reason for consultation: Encephalopathy   History of Present Illness: Jenny [name] is an 66 y.o. 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  Patient was recently admitted from 5/29 to 5/31 with AMS / confusion which was determined to most likely be secondary to Oxycodone.  Patient was taken off of Oxycodone and her confusion resolved.  Unfortunately this resulted in uncontrolled cancer pain over the next week, and Oxy IR was resumed on 6/8 at at a lower dose.  Since then, for the past couple of days she has had increased confusion.  he has had difficulty choosing words to say. She has had difficulty performing simple tasks that she has performed multiple times before, such as opening her Lovenox pen to inject herself. Her family brought her to the ED this morning because she said she wanted to go to the bathroom and then walked in the wrong direction to another room and had to be redirected.   Review of Systems:  ROS As per HPI otherwise 10 point review of systems negative.     Past Medical History: Past Medical History:  Diagnosis Date  . DVT (deep venous thrombosis) ([place])    1 year ago  . History of chemotherapy   . Hx of radiation therapy   . Hypercholesteremia   . Lung cancer [facility])     Past Surgical History: Past Surgical History:  Procedure Laterality Date  . CESAREAN SECTION    . ESOPHAGOGASTRODUODENOSCOPY (EGD) WITH PROPOFOL N/A 11/12/2015   Procedure: ESOPHAGOGASTRODUODENOSCOPY (EGD) WITH PROPOFOL;  Surgeon: Doran Stabler, MD;  Location: [facility] ENDOSCOPY;  Service: Endoscopy;  Laterality: N/A;  With propofol if available     Allergies:   Allergies  Allergen Reactions  . Fentanyl     Patient prefers to never take this  medication again. She doesn't like the way it makes her feel.   . Morphine And Related Itching and Rash     Social History:  reports that she quit smoking about 13 months ago. Her smoking use included Cigarettes. She has a 39.00 pack-year smoking history. She has never used smokeless tobacco. She reports that she uses drugs, including Benzodiazepines. She reports that she does not drink alcohol.   Family History: Family History  Problem Relation Age of Onset  . Stroke Mother   . Hypertension Mother   . Cancer Mother        head and neck cancer and non hodgkin's lymphoma  . Cancer Father        colon/rectal and squamous cell carcinoma skin  . Cancer Paternal Uncle        lung  . Cancer Paternal Grandfather        lymphoma  . Cancer Maternal Aunt        colon cancer  . Cancer Maternal Grandfather        mesothelioma      Physical Exam: Vitals:   10/15/16 0200 10/15/16 0220 10/15/16 0300 10/15/16 0330  BP: 97/67 97/67 105/71 (!) 85/59  Pulse: (!) 128 82 (!) 111 (!) 118  Resp: (!) 23 13 17  (!) 21  Temp:      TempSrc:      SpO2: 99% 98% 97%  99%    Constitutional: Alert and awake, oriented x3, not in any acute distress. Emaciated and profoundly malnourished Eyes: PERLA, EOMI, irises appear normal, anicteric sclera,  ENMT: external ears and nose appear normal, Masses noted to anterior skull            Lips appears normal, oropharynx mucosa, tongue, posterior pharynx appear normal  Neck: neck appears normal, no masses, normal ROM, no thyromegaly, no JVD  CVS: S1-S2 clear, no murmur rubs or gallops, no LE edema, normal pedal pulses  Respiratory:  clear to auscultation bilaterally, no wheezing, rales or rhonchi. Respiratory effort normal. No accessory muscle use.  Abdomen: soft nontender, nondistended, normal bowel sounds, no hepatosplenomegaly, no hernias  Musculoskeletal: : no cyanosis, clubbing or edema noted bilaterally Neuro: Cranial nerves II-XII intact, strength,  sensation, reflexes Psych: Does have mild confusion, forgot that she just had oxy IR in ED, was able to tel me that it was June, Weds, etc. Skin: no rashes or lesions or ulcers, no induration or nodules  Data reviewed:  I have personally reviewed following labs and imaging studies Labs:  CBC:  Recent Labs Lab 10/09/16 0854 10/15/16 0153  WBC 9.1 2.8*  NEUTROABS 7.3* 1.9  HGB 11.9 11.2*  HCT 37.5 34.3*  MCV 87.8 86.4  PLT 85* 57*    Basic Metabolic Panel:  Recent Labs Lab 10/09/16 0854 10/15/16 0153  NA 136 133*  K 4.9 4.4  CL  --  98*  CO2 24 23  GLUCOSE 138 104*  BUN 16.2 25*  CREATININE 0.8 0.84  CALCIUM 9.9 8.9  MG 1.4 Repeated and Verified* 1.4*   GFR Estimated Creatinine Clearance: 41.6 mL/min (by C-G formula based on SCr of 0.84 mg/dL). Liver Function Tests:  Recent Labs Lab 10/09/16 0854 10/15/16 0153  AST 29 27  ALT 16 14  ALKPHOS 278* 176*  BILITOT 0.45 0.6  PROT 6.7 7.1  ALBUMIN 2.7* 3.1*   No results for input(s): LIPASE, AMYLASE in the last 168 hours. No results for input(s): AMMONIA in the last 168 hours. Coagulation profile No results for input(s): INR, PROTIME in the last 168 hours.  Cardiac Enzymes: No results for input(s): CKTOTAL, CKMB, CKMBINDEX, TROPONINI in the last 168 hours. BNP: Invalid input(s): POCBNP CBG: No results for input(s): GLUCAP in the last 168 hours. D-Dimer No results for input(s): DDIMER in the last 72 hours. Hgb A1c No results for input(s): HGBA1C in the last 72 hours. Lipid Profile No results for input(s): CHOL, HDL, LDLCALC, TRIG, CHOLHDL, LDLDIRECT in the last 72 hours. Thyroid function studies No results for input(s): TSH, T4TOTAL, T3FREE, THYROIDAB in the last 72 hours.  Invalid input(s): FREET3 Anemia work up No results for input(s): VITAMINB12, FOLATE, FERRITIN, TIBC, IRON, RETICCTPCT in the last 72 hours. Urinalysis    Component Value Date/Time   COLORURINE STRAW (A) 09/29/2016 1642    APPEARANCEUR CLEAR 09/29/2016 1642   LABSPEC 1.005 09/29/2016 1642   PHURINE 5.0 09/29/2016 1642   GLUCOSEU NEGATIVE 09/29/2016 1642   HGBUR NEGATIVE 09/29/2016 1642   BILIRUBINUR NEGATIVE 09/29/2016 1642   BILIRUBINUR negative 04/18/2015 0856   KETONESUR NEGATIVE 09/29/2016 1642   PROTEINUR NEGATIVE 09/29/2016 1642   UROBILINOGEN 0.2 04/18/2015 0856   NITRITE NEGATIVE 09/29/2016 [place] 09/29/2016 1642     Microbiology No results found for this or any previous visit (from the past 240 hour(s)).     Inpatient Medications:   Scheduled Meds: Continuous Infusions: . magnesium sulfate 2 g (10/15/16 0309)  Radiological Exams on Admission: No results found.  Impression/Recommendations Principal Problem:   Encephalopathy acute Active Problems:   Small cell lung cancer ([place])  1. Acute Encephalopathy - Admission offered to but declined by patient and husband. 1. Most likely given the HPI think that return of confusion is related to resumption of oxycodone; however, certainly expect that this patient with terminal, wide spread metastatic cancer will need some opiate for pain. 2. Perhaps a different opiate could be used. 3. Will defer this decision to Oncology for the moment as they dont want admission / pal care consult at this time. 4. Husband feels that patients confusion is improved since coming to ED and they plan on calling Dr. Benay Spice later today. 5. In ED: Getting UA and CXR to r/o any other obvious cause of confusion, but patient having no other symptoms to suggest UTI or PNA.  No fever, no cough, no dysuria, etc. 1. If either found, then we would push even harder for admission. 6. Alternatively could be related to intracranial extension of cranial mets of tumor (as demonstrated on CT scan in march); however, this didn't seem to have any significant mass effect at that time, and I suspect that if there was much more that could be reasonably done  about these tumors (radiation / chemo) it would have already been done.  Additionally doubt that this profoundly malnourished patient would be a great candidate for open brain surgery even if we did find something on an MRI at this stage. 1. With that said, could consider CT head to see if any progression to be causing confusion or vasogenic edema which might respond to steroids. 2. Hypomagnesemia - getting replaced in ED 3. Sinus tachycardia 110s-120s - patient and husband are very clear that patient has chronic sinus tachycardia at baseline for over a year now (review of HRs during last admission do seem to support this, patient discharged with HR 110, office visit 6/8 HR 121). 4. Mild hypotension with SBP in the 90s to 100s - Patient and husband are also very clear that this is baseline as well (again review of BPs during last visit do seem to support this claim, admission SBP 99, discharge SBP 109)      Time Spent: 80 min  Valissa Lyvers M. D.O. [facility] 10/15/2016, 3:47 AM

## 2016-10-15 NOTE — ED Triage Notes (Signed)
Pt brought in by her family  Family states she is confused and disoriented and has been nonverbal  Family states this happened a couple weeks ago and it was from her medication  Family states for instance she will get up to go to the bathroom but instead she will walk into a different room  Family states they noticed some small things a couple days ago but it has progressively gotten worse

## 2016-10-15 NOTE — ED Notes (Signed)
Bed: VK12 Expected date:  Expected time:  Means of arrival:  Comments: 66 yr old allergic reaction to OJ

## 2016-10-15 NOTE — ED Notes (Addendum)
Pt is alert and oriented x 4, pt is  verbally responsive . Pt spouse states that she has increased confusion, and per husband pt communications has decreased, and pt is not able to carry on conversation that has started or worsened today . Pt reports that she has generalized pain, pt appears frail and pale. Pt had last chemo on 10/09/16. Pt spouse brother and sister are present in the room.

## 2016-10-15 NOTE — ED Provider Notes (Signed)
[facility] DEPT Provider Note: Jenny Spurling, MD, FACEP  CSN: [id number] MRN: [id number] ARRIVAL: 10/15/16 at [place]: [place]  Altered Mental Status   HISTORY OF PRESENT ILLNESS  Jenny [name] is a 66 y.o. female with metastatic small cell lung cancer. She has been to the ED several times in recent months for acute confusion. Most recently this was attributed to high doses of OxyContin and oxycodone and the OxyContin was discontinued and the oxycodone dose reduced. She was recently started on a lower dose of OxyContin about a week ago due to breakthrough pain.  She is here with a two-day history of new onset confusion. She has had difficulty choosing words to say. She has had difficulty performing simple tasks that she has performed multiple times before, such as opening her Lovenox pen to inject herself. Her family brought her to the ED this morning because she said she wanted to go to the bathroom and then walked in the wrong direction to another room and had to be redirected. She denies any new pain apart from her known chronic pain. She is having nausea but no vomiting. There is been no acute change in her chronic shortness of breath. She has known chronic tachycardia. She has not had any pain medication in about 6 hours and is requesting her usual dose. Her confusion is similar previous episodes but without agitation or combativeness as she has shown before.   Past Medical History:  Diagnosis Date  . DVT (deep venous thrombosis) ([place])    1 year ago  . History of chemotherapy   . Hx of radiation therapy   . Hypercholesteremia   . Lung cancer [facility])     Past Surgical History:  Procedure Laterality Date  . CESAREAN SECTION    . ESOPHAGOGASTRODUODENOSCOPY (EGD) WITH PROPOFOL N/A 11/12/2015   Procedure: ESOPHAGOGASTRODUODENOSCOPY (EGD) WITH PROPOFOL;  Surgeon: Doran Stabler, MD;  Location: [facility] ENDOSCOPY;  Service: Endoscopy;  Laterality: N/A;  With  propofol if available    Family History  Problem Relation Age of Onset  . Stroke Mother   . Hypertension Mother   . Cancer Mother        head and neck cancer and non hodgkin's lymphoma  . Cancer Father        colon/rectal and squamous cell carcinoma skin  . Cancer Paternal Uncle        lung  . Cancer Paternal Grandfather        lymphoma  . Cancer Maternal Aunt        colon cancer  . Cancer Maternal Grandfather        mesothelioma    Social History  Substance Use Topics  . Smoking status: Former Smoker    Packs/day: 1.00    Years: 39.00    Types: Cigarettes    Quit date: 08/20/2015  . Smokeless tobacco: Never Used  . Alcohol use No    Prior to Admission medications   Medication Sig Start Date End Date Taking? Authorizing Provider  acetaminophen (TYLENOL) 500 MG tablet Take 500 mg by mouth every 8 (eight) hours as needed for mild pain.    Yes [provider]  ALPRAZolam (XANAX) 0.25 MG tablet Take 1 tablet (0.25 mg total) by mouth daily as needed for anxiety or sleep. 08/28/16  Yes Owens Shark, NP  dexamethasone (DECADRON) 4 MG tablet Take 1 tablet (4 mg total) by mouth 2 (two) times daily with a meal. Begin the  day after chemotherapy 09/24/16  Yes Owens Shark, NP  diphenoxylate-atropine (LOMOTIL) 2.5-0.025 MG tablet Take 2 tablets by mouth 4 (four) times daily as needed for diarrhea or loose stools. 08/20/16  Yes Ladell Pier, MD  enoxaparin (LOVENOX) 80 MG/0.8ML injection Inject 0.65 mLs (65 mg total) into the skin daily. 06/10/16  Yes Burchette, Alinda Sierras, MD  feeding supplement, ENSURE ENLIVE, (ENSURE ENLIVE) LIQD Take 237 mLs by mouth 2 (two) times daily between meals. 05/20/16  Yes Sheikh, Omair Latif, DO  magnesium oxide (MAG-OX) 400 (241.3 Mg) MG tablet Take 1 tablet (400 mg total) by mouth daily. 10/01/16  Yes Florencia Reasons, MD  ondansetron (ZOFRAN-ODT) 8 MG disintegrating tablet Take 1 tablet (8 mg total) by mouth every 8 (eight) hours as needed for nausea or  vomiting. 08/21/16  Yes Ladell Pier, MD  oxyCODONE (OXY IR/ROXICODONE) 5 MG immediate release tablet Take 1-2 tablets (5-10 mg total) by mouth every 4 (four) hours as needed for severe pain. 10/09/16  Yes Ladell Pier, MD  oxyCODONE (OXYCONTIN) 10 mg 12 hr tablet Take 1 tablet (10 mg total) by mouth every 12 (twelve) hours. 10/09/16  Yes Ladell Pier, MD  polyethylene glycol [facility]) packet Take 17 g by mouth daily as needed for mild constipation. 05/20/16  Yes Sheikh, Omair Latif, DO  prochlorperazine (COMPAZINE) 10 MG tablet Take 1 tablet (10 mg total) by mouth every 6 (six) hours as needed for nausea or vomiting. 07/13/16  Yes Ladell Pier, MD  senna-docusate (SENOKOT-S) 8.6-50 MG tablet Take 1 tablet by mouth 2 (two) times daily. Patient taking differently: Take 1 tablet by mouth daily as needed for mild constipation.  05/01/16  Yes Ladell Pier, MD  vitamin B-12 1000 MCG tablet Take 1 tablet (1,000 mcg total) by mouth daily. 10/01/16  Yes Florencia Reasons, MD    Allergies Fentanyl and Morphine and related   REVIEW OF SYSTEMS  Negative except as noted here or in the History of Present Illness.   PHYSICAL EXAMINATION  Initial Vital Signs Blood pressure 100/69, pulse (!) 120, temperature 97.7 F (36.5 C), temperature source Oral, resp. rate 18, SpO2 100 %.  Examination General: Well-developed, cachectic female in no acute distress; appearance consistent with age of record HENT: Multiple bony nodules of skull consistent with metastases seen on prior CT Eyes: pupils equal, round and reactive to light; extraocular muscles intact Neck: supple Heart: regular rate and rhythm with frequent PACs; tachycardia Lungs: Faint left upper expiratory wheeze Abdomen: soft; nondistended; left and right upper quadrant tender masses consistent with location of metastases seen on prior CT scan; bowel sounds present Extremities: No deformity; full range of motion; pulses normal; +1 edema of ankles  and feet Neurologic: Awake, alert and oriented x 2; motor function intact in all extremities and symmetric; no facial droop Skin: Warm and dry; pale Psychiatric: Flat affect   RESULTS  Summary of this visit's results, reviewed by myself:   EKG Interpretation  Date/Time:    Ventricular Rate:    PR Interval:    QRS Duration:   QT Interval:    QTC Calculation:   R Axis:     Text Interpretation:        Laboratory Studies: Results for orders placed or performed during the hospital encounter of 10/15/16 (from the past 24 hour(s))  Comprehensive metabolic panel     Status: Abnormal   Collection Time: 10/15/16  1:53 AM  Result Value Ref Range   Sodium 133 (L)  135 - 145 mmol/L   Potassium 4.4 3.5 - 5.1 mmol/L   Chloride 98 (L) 101 - 111 mmol/L   CO2 23 22 - 32 mmol/L   Glucose, Bld 104 (H) 65 - 99 mg/dL   BUN 25 (H) 6 - 20 mg/dL   Creatinine, Ser 0.84 0.44 - 1.00 mg/dL   Calcium 8.9 8.9 - 10.3 mg/dL   Total Protein 7.1 6.5 - 8.1 g/dL   Albumin 3.1 (L) 3.5 - 5.0 g/dL   AST 27 15 - 41 U/L   ALT 14 14 - 54 U/L   Alkaline Phosphatase 176 (H) 38 - 126 U/L   Total Bilirubin 0.6 0.3 - 1.2 mg/dL   GFR calc non Af Amer >60 >60 mL/min   GFR calc Af Amer >60 >60 mL/min   Anion gap 12 5 - 15  CBC with Differential/Platelet     Status: Abnormal   Collection Time: 10/15/16  1:53 AM  Result Value Ref Range   WBC 2.8 (L) 4.0 - 10.5 K/uL   RBC 3.97 3.87 - 5.11 MIL/uL   Hemoglobin 11.2 (L) 12.0 - 15.0 g/dL   HCT 34.3 (L) 36.0 - 46.0 %   MCV 86.4 78.0 - 100.0 fL   MCH 28.2 26.0 - 34.0 pg   MCHC 32.7 30.0 - 36.0 g/dL   RDW 16.9 (H) 11.5 - 15.5 %   Platelets 57 (L) 150 - 400 K/uL   Neutrophils Relative % 68 %   Neutro Abs 1.9 1.7 - 7.7 K/uL   Lymphocytes Relative 12 %   Lymphs Abs 0.3 (L) 0.7 - 4.0 K/uL   Monocytes Relative 18 %   Monocytes Absolute 0.5 0.1 - 1.0 K/uL   Eosinophils Relative 2 %   Eosinophils Absolute 0.1 0.0 - 0.7 K/uL   Basophils Relative 0 %   Basophils Absolute  0.0 0.0 - 0.1 K/uL  Magnesium     Status: Abnormal   Collection Time: 10/15/16  1:53 AM  Result Value Ref Range   Magnesium 1.4 (L) 1.7 - 2.4 mg/dL  Urinalysis, Routine w reflex microscopic     Status: Abnormal   Collection Time: 10/15/16  5:16 AM  Result Value Ref Range   Color, Urine YELLOW YELLOW   APPearance CLEAR CLEAR   Specific Gravity, Urine 1.016 1.005 - 1.030   pH 5.0 5.0 - 8.0   Glucose, UA NEGATIVE NEGATIVE mg/dL   Hgb urine dipstick NEGATIVE NEGATIVE   Bilirubin Urine NEGATIVE NEGATIVE   Ketones, ur NEGATIVE NEGATIVE mg/dL   Protein, ur NEGATIVE NEGATIVE mg/dL   Nitrite NEGATIVE NEGATIVE   Leukocytes, UA TRACE (A) NEGATIVE   RBC / HPF 0-5 0 - 5 RBC/hpf   WBC, UA 0-5 0 - 5 WBC/hpf   Bacteria, UA NONE SEEN NONE SEEN   Squamous Epithelial / LPF 0-5 (A) NONE SEEN   Mucous PRESENT    Hyaline Casts, UA PRESENT    Imaging Studies: Ct Head Wo Contrast  Result Date: 10/15/2016 CLINICAL DATA:  Increasing confusion, disoriented and nonverbal for a few days. History of lung cancer with known skull metastasis. EXAM: CT HEAD WITHOUT CONTRAST TECHNIQUE: Contiguous axial images were obtained from the base of the skull through the vertex without intravenous contrast. COMPARISON:  CT HEAD July 06, 2016 FINDINGS: BRAIN: Increasing size of previous existing calvarial metastasis with dural invasion. LEFT frontal metastasis previously demonstrated 4 mm intracranial component, now 8 mm in AP dimension with chronic superior sagittal sinus invasion. New LEFT frontal calvarial metastasis  with 4 mm intracranial extension. No intraparenchymal hemorrhage, significant mass effect nor midline shift. The ventricles and sulci are normal for age. No acute large vascular territory infarcts. No abnormal extra-axial fluid collections. Basal cisterns are patent. VASCULAR: Trace calcific atherosclerosis of the carotid siphons. SKULL: No skull fracture. Diffuse osseous metastasis including skullbase with  permeative to lytic appearance. Enlarging scalp metastasis. SINUSES/ORBITS: Sphenoid mucosal thickening. Mastoid air cells are well aerated.The included ocular globes and orbital contents are non-suspicious. OTHER: None. IMPRESSION: Multiple new and increasing size of previous existing calvarial metastasis with dural invasion. No midline shift or acute intracranial process. Electronically Signed   By: Elon Alas M.D.   On: 10/15/2016 05:28   Dg Chest Port 1 View  Result Date: 10/15/2016 CLINICAL DATA:  Acute onset of altered mental status. Initial encounter. EXAM: PORTABLE CHEST 1 VIEW COMPARISON:  Chest radiograph performed 09/29/2016 FINDINGS: The lungs are well-aerated. Left perihilar airspace opacity is similar in appearance and may reflect postradiation changes. Known malignancy is difficult to characterize on radiograph. Retrocardiac airspace opacity is nonspecific, and pneumonia cannot be excluded. No significant pleural effusion or pneumothorax is seen. The cardiomediastinal silhouette is borderline enlarged. No acute osseous abnormalities are seen. IMPRESSION: 1. Retrocardiac airspace opacity is nonspecific. Pneumonia cannot be excluded. 2. Left perihilar airspace opacity is similar in appearance and may reflect postradiation changes. Known malignancy is not well characterized on radiograph. 3. Borderline cardiomegaly. Electronically Signed   By: Garald Balding M.D.   On: 10/15/2016 04:10    ED COURSE  Nursing notes and initial vitals signs, including pulse oximetry, reviewed.  Vitals:   10/15/16 0500 10/15/16 0530 10/15/16 0600 10/15/16 0634  BP: (!) 122/95 113/71 114/79 94/83  Pulse: (!) 111 (!) 117 (!) 118 (!) 138  Resp: 12 20 18  (!) 24  Temp:      TempSrc:      SpO2: 96% 97% 99% 98%   2:52 AM IV fluid bolus given for hypertension on arrival. Magnesium sulfate ordered for hypomagnesemia.   6:53 AM See consult note by Dr. Alcario Drought off [facility] hospitalist. Patient and husband are  not seeking admission at this time. She was given IV fluids in the ED. She is not showing evidence of an acute infection. Her scar metastases have enlarged but there is no significant mass effect or intraparenchymal brain metastases seen. They will contact her physician later today for advice on adjustment of pain medications. This seems to be the most likely cause of her transient delirium episodes.  PROCEDURES    ED DIAGNOSES     ICD-10-CM   1. Delirium R41.0   2. Encephalopathy acute G93.40 DG CHEST PORT 1 VIEW    DG CHEST PORT 1 VIEW       Yasmene Salomone, MD 10/15/16 [phone]

## 2016-10-16 ENCOUNTER — Ambulatory Visit (HOSPITAL_BASED_OUTPATIENT_CLINIC_OR_DEPARTMENT_OTHER): Payer: BLUE CROSS/BLUE SHIELD | Admitting: Nurse Practitioner

## 2016-10-16 ENCOUNTER — Ambulatory Visit (HOSPITAL_BASED_OUTPATIENT_CLINIC_OR_DEPARTMENT_OTHER): Payer: BLUE CROSS/BLUE SHIELD

## 2016-10-16 VITALS — BP 97/63 | HR 103 | Temp 98.8°F | Resp 16

## 2016-10-16 VITALS — BP 84/62 | HR 149 | Temp 98.9°F | Resp 18 | Ht 65.0 in | Wt 84.2 lb

## 2016-10-16 DIAGNOSIS — C7931 Secondary malignant neoplasm of brain: Secondary | ICD-10-CM | POA: Diagnosis not present

## 2016-10-16 DIAGNOSIS — C3490 Malignant neoplasm of unspecified part of unspecified bronchus or lung: Secondary | ICD-10-CM | POA: Diagnosis not present

## 2016-10-16 MED ORDER — SODIUM CHLORIDE 0.9 % IV SOLN
Freq: Once | INTRAVENOUS | Status: AC
  Administered 2016-10-16: 16:00:00 via INTRAVENOUS

## 2016-10-16 MED ORDER — OXYCODONE HCL 5 MG PO TABS: 5.0000 mg | ORAL_TABLET | ORAL | 0 refills | Status: DC | PRN

## 2016-10-16 MED ORDER — ALPRAZOLAM 0.25 MG PO TABS: 0.2500 mg | ORAL_TABLET | Freq: Every day | ORAL | 0 refills | Status: AC | PRN

## 2016-10-16 NOTE — Progress Notes (Signed)
[facility] OFFICE PROGRESS NOTE   Diagnosis:  Small cell lung cancer  INTERVAL HISTORY:   Ms. Benegas returns as scheduled. She completed another cycle of irinotecan/cisplatin 10/09/2016. She was seen in the emergency department yesterday for confusion. Brain CT showed multiple new and increasing size of previous existing calvarial metastases with dural invasion. The confusion was felt to possibly be related to resuming OxyContin. She declined hospitalization. She received IV fluids. Her family noted improvement in her mental status following the IV fluids. She continues OxyContin with oxycodone for breakthrough pain. She continues to have mild intermittent nausea. She had a loose stool earlier today.  Objective:  Vital signs in last 24 hours:  Blood pressure (!) 84/62, pulse (!) 149, temperature 98.9 F (37.2 C), temperature source Oral, resp. rate 18, height 5\' 5"  (1.651 m), weight 84 lb 3.2 oz (38.2 kg), SpO2 97 %.    HEENT: No thrush or ulcers. Resp: Lungs with scattered expiratory wheezes. No respiratory distress. Cardio: Regular, tachycardic. GI: No hepatomegaly. Vascular: Trace edema at the ankles bilaterally. Neuro: Alert. She seems mildly confused.  Skin: There are multiple scalp masses.   Lab Results:  Lab Results  Component Value Date   WBC 2.8 (L) 10/15/2016   HGB 11.2 (L) 10/15/2016   HCT 34.3 (L) 10/15/2016   MCV 86.4 10/15/2016   PLT 57 (L) 10/15/2016   NEUTROABS 1.9 10/15/2016    Imaging:  Ct Head Wo Contrast  Result Date: 10/15/2016 CLINICAL DATA:  Increasing confusion, disoriented and nonverbal for a few days. History of lung cancer with known skull metastasis. EXAM: CT HEAD WITHOUT CONTRAST TECHNIQUE: Contiguous axial images were obtained from the base of the skull through the vertex without intravenous contrast. COMPARISON:  CT HEAD July 06, 2016 FINDINGS: BRAIN: Increasing size of previous existing calvarial metastasis with dural  invasion. LEFT frontal metastasis previously demonstrated 4 mm intracranial component, now 8 mm in AP dimension with chronic superior sagittal sinus invasion. New LEFT frontal calvarial metastasis with 4 mm intracranial extension. No intraparenchymal hemorrhage, significant mass effect nor midline shift. The ventricles and sulci are normal for age. No acute large vascular territory infarcts. No abnormal extra-axial fluid collections. Basal cisterns are patent. VASCULAR: Trace calcific atherosclerosis of the carotid siphons. SKULL: No skull fracture. Diffuse osseous metastasis including skullbase with permeative to lytic appearance. Enlarging scalp metastasis. SINUSES/ORBITS: Sphenoid mucosal thickening. Mastoid air cells are well aerated.The included ocular globes and orbital contents are non-suspicious. OTHER: None. IMPRESSION: Multiple new and increasing size of previous existing calvarial metastasis with dural invasion. No midline shift or acute intracranial process. Electronically Signed   By: Elon Alas M.D.   On: 10/15/2016 05:28   Dg Chest Port 1 View  Result Date: 10/15/2016 CLINICAL DATA:  Acute onset of altered mental status. Initial encounter. EXAM: PORTABLE CHEST 1 VIEW COMPARISON:  Chest radiograph performed 09/29/2016 FINDINGS: The lungs are well-aerated. Left perihilar airspace opacity is similar in appearance and may reflect postradiation changes. Known malignancy is difficult to characterize on radiograph. Retrocardiac airspace opacity is nonspecific, and pneumonia cannot be excluded. No significant pleural effusion or pneumothorax is seen. The cardiomediastinal silhouette is borderline enlarged. No acute osseous abnormalities are seen. IMPRESSION: 1. Retrocardiac airspace opacity is nonspecific. Pneumonia cannot be excluded. 2. Left perihilar airspace opacity is similar in appearance and may reflect postradiation changes. Known malignancy is not well characterized on radiograph. 3.  Borderline cardiomegaly. Electronically Signed   By: Garald Balding M.D.   On: 10/15/2016 04:10  Medications: I have reviewed the patient's current medications.  Assessment/Plan: 1. Limited stage small cell lung cancer, diagnosed on biopsy of a subcarinal lymph node and left lung mass by [facility] 09/12/2015  PET scan 09/03/2015 confirmed a hypermetabolic left perihilar mass, 2 left lower lobe pulmonary masses, and mediastinal lymphadenopathy, no evidence of distant metastatic disease  Brain MRI negative for metastatic disease 09/13/2015  Status post chest radiation 09/26/2015 through 10/18/2015  Cycle 1 cisplatin/etoposide beginning 09/26/2015  Cycle 2 cisplatin/etoposide beginning 10/16/2015  Cycle 3 carboplatin/etoposide beginning 11/06/2015  12/17/2015 MRI of the brain negative  12/17/2015 CT scans chest/abdomen/pelvis (report currently not available)  Bone scan 12/27/2015 with minimal increased uptake in the posterior aspect of the right 11th rib that does not appear abnormal on the CT images of August 2017; sclerotic focus noted on the previous CT scan in the body of L1 showed no abnormal uptake on the current bone scan; asymmetric increased uptake in the left SI joint region possibly corresponding to a subtle sclerotic focus in the medial posterior aspect of the left iliac bone  CT chest 04/07/2016-new liver metastases, new upper abdominal adenopathy, left iliac metastasis, new right posterior lateral Pleural/chest wall metastasis  Cycle 1 salvage chemotherapy with etoposide/carboplatin01/12/2016  Cycle 2 salvage chemotherapy with etoposide/carboplatin 06/09/2016-Neulasta added  Restaging CTs of the head, chest, abdomen, and pelvis on 07/06/2016-progressive skull metastases with intracranial/extracranial extension, stable lung nodules, progressive disease in the liver, bones, enlargement of a right chest wall mass, and enlarging peritoneal nodule  Cycle 1  irinotecan/cisplatin 07/13/2016  Cycle 2 irinotecan/cisplatin 07/27/2016  Cycle 3 irinotecan/cisplatin 08/10/2016  CT right arm 08/14/2016-metastatic lesion proximal right humeral shaft with a moth-eatenappearance. At risk for pathologic fracture. Metastatic lesion in the proximal right radial shaft with apparent extension into adjacent soft tissues.  Status post pelvic radiation to the proximal right humerus and proximal right radius/ulna/distal humerus in 1 fraction 08/26/2016   CT chest 08/20/2016-overall stable disease, mixed response with a decrease in the right chest wall mass and a lingular nodule, slight increase in several of the liver lesions  Cycle 4 irinotecan/cisplatin 08/28/2016  Cycle 5 irinotecan/cisplatin 09/11/2016  Cycle 6 irinotecan/cisplatin 09/24/2016  Cycle 7 irinotecan/cisplatin 10/09/2016  2. 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 Weight loss secondary to #2  4. History of febrile neutropenia following chemotherapy  5. Remote history of a right leg DVT-2008?-Maintained on Lovenox  6. History of tobacco use  7. Admission to [facility] 10/27/2015 with neutropenic fever, esophagitis, and dehydration  8. Hypercalcemia 05/01/2016-status post intravenous fluids and Zometa12/29/2017  9. Fever prior to hospital admission 05/17/2016-infectious versus tumor fever; treated for pneumonia, respiratory panel positive for coronavirus  10. 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 Altered mental status-potentially related to infection/delirium, concern for brain metastases; brain CT 05/19/2016-no brain metastases, multiple skull metastases; improved 05/28/2016, 06/22/2016.  12. History of Leukopenia/thrombocytopenia secondary to chemotherapy-Neulasta added with cycle 2 etoposide/carboplatin  13.  Tachycardia/hypotension-persistent,multifactorial    Disposition: Ms. Schwandt appears unchanged. She completed another cycle of irinotecan/cisplatin on 10/09/2016. She was seen in the emergency department yesterday for evaluation of confusion. Brain CT showed multiple new and increasing size of previous existing calvarial metastasis with dural invasion. Dr. Benay Spice reviewed these findings with Ms. Conteh and her family. Her mental status improved with IV fluids. She will receive additional IV fluids today.  We are referring her for restaging CT scans next week. We will  see her in follow-up on 10/21/2016. She or her family will contact the office in the interim with any problems.  Patient seen with Dr. Benay Spice. 25 minutes were spent face-to-face at today's visit with the majority of that time involved in counseling/coordination of care.  Micha, Erck ANP/GNP-BC   10/16/2016  3:42 PM  This was a shared visit with Ned Card. Ms. Uehara has a poor performance status. Her overall condition appears to be declining. We discussed the 10/15/2016 CT findings with Ms. Holdman and her family. We discussed pain control. She will be scheduled for restaging CT scans prior to a return visit 10/21/2016. I will recommend hospice care if the CTs confirm systemic disease progression.  Julieanne Manson, M.D.

## 2016-10-16 NOTE — Patient Instructions (Signed)

## 2016-10-19 ENCOUNTER — Telehealth: Payer: Self-pay

## 2016-10-19 ENCOUNTER — Telehealth: Payer: Self-pay | Admitting: *Deleted

## 2016-10-19 DIAGNOSIS — C3490 Malignant neoplasm of unspecified part of unspecified bronchus or lung: Secondary | ICD-10-CM

## 2016-10-19 MED ORDER — OXYCODONE HCL 5 MG PO TABS: 5.0000 mg | ORAL_TABLET | ORAL | 0 refills | Status: DC | PRN

## 2016-10-19 NOTE — Telephone Encounter (Signed)
Telephone call to patient to verify which pain medication needed to be refilled. Oxycodone Immediate Release is requested. Script printed. And Ready for pick up.

## 2016-10-19 NOTE — Telephone Encounter (Signed)
John called for an oxycodone refill. She has about 3 doses left. [phone].

## 2016-10-19 NOTE — Telephone Encounter (Signed)
S/w husband that rx is ready for pickup

## 2016-10-20 ENCOUNTER — Telehealth: Payer: Self-pay

## 2016-10-20 ENCOUNTER — Telehealth: Payer: Self-pay | Admitting: *Deleted

## 2016-10-20 NOTE — Telephone Encounter (Signed)
S/w brother, Tyson Babinski. Pt saw Dr Benay Spice last week. He was discussing treatment this Friday. The bed was not available until next Wednesday.  He then mentioned needing scans to figure out if she needs treatment.  CT orders are in computer. The prior authorization is not done yet.  inbasket sent to [facility]. It is ok to make the CT appt when available and call pt with date/time.  She has an appt on 6/27 for lab/infusion. Ed stated she is supposed to see Lattie Haw or Dr Benay Spice at that time as well. Does this date need to be changed pending CT?

## 2016-10-20 NOTE — Telephone Encounter (Signed)
Message from pt's brother with questions about treatment and plan. Noted he is not listed on ROI form. Called pt, brother has stepped out, she gave verbal permission to discuss her care with him. She will have him call back.

## 2016-10-20 NOTE — Telephone Encounter (Signed)
Feliz Beam RN with [facility] called with update. Pt has a new pressure injury 2 cm x 2 cm eschar area surrounded by irritated darker area of 10 cm x 5 cm on her coccyx. They instructed her to turn more frequently. They are going to have pt increase boost to 3-4/ day as tolerated to increase protein. They are putting hydrocolloid dressing on today and will order supplies of softer foam dressing. She weighs 84 lb. Pulse 108, BP 100/60. Pain is 7.5/10 before pain med and 5/10 after pain med.  VO for dressing changes given.   Jenny Reichmann Pruitt's phone [phone]

## 2016-10-20 NOTE — Telephone Encounter (Signed)
Returned call to pt's brother. Awaiting insurance approval for CT. Pt will need office visit to review scans prior to 6/27 infusion.

## 2016-10-21 ENCOUNTER — Telehealth: Payer: Self-pay | Admitting: Oncology

## 2016-10-21 ENCOUNTER — Other Ambulatory Visit: Payer: Self-pay | Admitting: *Deleted

## 2016-10-21 NOTE — Telephone Encounter (Signed)
Patient needs an appointment to follow up with her Scan but Dr Benay Spice is full she has labs and infusion on 6/27 that depends on the scans

## 2016-10-22 ENCOUNTER — Ambulatory Visit (HOSPITAL_COMMUNITY)
Admission: RE | Admit: 2016-10-22 | Discharge: 2016-10-22 | Disposition: A | Discharge status: Home / Self Care | Payer: BLUE CROSS/BLUE SHIELD | Source: Ambulatory Visit | Attending: Nurse Practitioner | Admitting: Nurse Practitioner

## 2016-10-22 DIAGNOSIS — C7951 Secondary malignant neoplasm of bone: Secondary | ICD-10-CM | POA: Diagnosis not present

## 2016-10-22 DIAGNOSIS — I700 Atherosclerosis of aorta: Secondary | ICD-10-CM | POA: Insufficient documentation

## 2016-10-22 DIAGNOSIS — C787 Secondary malignant neoplasm of liver and intrahepatic bile duct: Secondary | ICD-10-CM | POA: Diagnosis not present

## 2016-10-22 DIAGNOSIS — C3490 Malignant neoplasm of unspecified part of unspecified bronchus or lung: Secondary | ICD-10-CM | POA: Diagnosis present

## 2016-10-22 DIAGNOSIS — C778 Secondary and unspecified malignant neoplasm of lymph nodes of multiple regions: Secondary | ICD-10-CM | POA: Diagnosis not present

## 2016-10-22 DIAGNOSIS — M4856XA Collapsed vertebra, not elsewhere classified, lumbar region, initial encounter for fracture: Secondary | ICD-10-CM | POA: Insufficient documentation

## 2016-10-22 DIAGNOSIS — I2510 Atherosclerotic heart disease of native coronary artery without angina pectoris: Secondary | ICD-10-CM | POA: Diagnosis not present

## 2016-10-22 DIAGNOSIS — J90 Pleural effusion, not elsewhere classified: Secondary | ICD-10-CM | POA: Diagnosis not present

## 2016-10-22 DIAGNOSIS — C7800 Secondary malignant neoplasm of unspecified lung: Secondary | ICD-10-CM | POA: Diagnosis not present

## 2016-10-22 MED ORDER — IOPAMIDOL (ISOVUE-300) INJECTION 61%
INTRAVENOUS | Status: AC
  Filled 2016-10-22: qty 100

## 2016-10-22 MED ORDER — IOPAMIDOL (ISOVUE-300) INJECTION 61%
100.0000 mL | Freq: Once | INTRAVENOUS | Status: AC | PRN
  Administered 2016-10-22: 80 mL via INTRAVENOUS

## 2016-10-22 NOTE — Telephone Encounter (Signed)
Call placed to notify pt of appt tomorrow with MD Benay Spice at 1445. Pt's brother, Jajaira Ruis, appreciative of the call and states that they will be here tomorrow.

## 2016-10-23 ENCOUNTER — Other Ambulatory Visit: Payer: Self-pay

## 2016-10-23 ENCOUNTER — Ambulatory Visit (HOSPITAL_BASED_OUTPATIENT_CLINIC_OR_DEPARTMENT_OTHER): Payer: BLUE CROSS/BLUE SHIELD | Admitting: Oncology

## 2016-10-23 ENCOUNTER — Ambulatory Visit: Payer: Self-pay

## 2016-10-23 ENCOUNTER — Ambulatory Visit: Payer: Self-pay | Admitting: Oncology

## 2016-10-23 VITALS — BP 115/56 | HR 124 | Temp 97.7°F | Resp 20 | Ht 65.0 in | Wt 85.3 lb

## 2016-10-23 DIAGNOSIS — C3492 Malignant neoplasm of unspecified part of left bronchus or lung: Secondary | ICD-10-CM | POA: Diagnosis not present

## 2016-10-23 DIAGNOSIS — C7951 Secondary malignant neoplasm of bone: Secondary | ICD-10-CM

## 2016-10-23 DIAGNOSIS — G893 Neoplasm related pain (acute) (chronic): Secondary | ICD-10-CM

## 2016-10-23 DIAGNOSIS — J918 Pleural effusion in other conditions classified elsewhere: Secondary | ICD-10-CM

## 2016-10-23 DIAGNOSIS — C3490 Malignant neoplasm of unspecified part of unspecified bronchus or lung: Secondary | ICD-10-CM

## 2016-10-23 DIAGNOSIS — C787 Secondary malignant neoplasm of liver and intrahepatic bile duct: Secondary | ICD-10-CM | POA: Diagnosis not present

## 2016-10-23 MED ORDER — OXYCODONE HCL 5 MG PO TABS: 5.0000 mg | ORAL_TABLET | ORAL | 0 refills | Status: DC | PRN

## 2016-10-23 NOTE — Progress Notes (Signed)
Jenny [name]   Diagnosis: Small cell lung cancer  INTERVAL HISTORY:   Jenny [name] returns as scheduled. She continues to have pain in multiple sites. She is taking oxycodone every 4 hours. She takes OxyContin twice daily. No new complaint. She presents today with multiple family members.  Objective:  Vital signs in last 24 hours:  Blood pressure (!) 115/56, pulse (!) 124, temperature 97.7 F (36.5 C), temperature source Oral, resp. rate 20, height 5\' 5"  (1.651 m), weight 85 lb 4.8 oz (38.7 kg), SpO2 97 %.    HEENT: No thrush Resp: Decreased breath sounds at the right posterior chest, no respiratory distress Cardio: Tachycardia, regular rate and rhythm GI: Firm fullness throughout the upper abdomen without a discrete mass Vascular: Trace edema at the low leg and ankle bilaterally Neuro: Alert and oriented  Skin: Multiple scalp masses     Lab Results:  Lab Results  Component Value Date   WBC 2.8 (L) 10/15/2016   HGB 11.2 (L) 10/15/2016   HCT 34.3 (L) 10/15/2016   MCV 86.4 10/15/2016   PLT 57 (L) 10/15/2016   NEUTROABS 1.9 10/15/2016    CMP     Component Value Date/Time   NA 133 (L) 10/15/2016 0153   NA 136 10/09/2016 0854   K 4.4 10/15/2016 0153   K 4.9 10/09/2016 0854   CL 98 (L) 10/15/2016 0153   CO2 23 10/15/2016 0153   CO2 24 10/09/2016 0854   GLUCOSE 104 (H) 10/15/2016 0153   GLUCOSE 138 10/09/2016 0854   BUN 25 (H) 10/15/2016 0153   BUN 16.2 10/09/2016 0854   CREATININE 0.84 10/15/2016 0153   CREATININE 0.8 10/09/2016 0854   CALCIUM 8.9 10/15/2016 0153   CALCIUM 9.9 10/09/2016 0854   PROT 7.1 10/15/2016 0153   PROT 6.7 10/09/2016 0854   ALBUMIN 3.1 (L) 10/15/2016 0153   ALBUMIN 2.7 (L) 10/09/2016 0854   AST 27 10/15/2016 0153   AST 29 10/09/2016 0854   ALT 14 10/15/2016 0153   ALT 16 10/09/2016 0854   ALKPHOS 176 (H) 10/15/2016 0153   ALKPHOS 278 (H) 10/09/2016 0854   BILITOT 0.6 10/15/2016 0153   BILITOT 0.45  10/09/2016 0854   GFRNONAA >60 10/15/2016 0153   GFRAA >60 10/15/2016 0153     Imaging:  Ct Chest W Contrast  Result Date: 10/23/2016 CLINICAL DATA:  Small cell lung cancer, for restaging EXAM: CT CHEST, ABDOMEN, AND PELVIS WITH CONTRAST TECHNIQUE: Multidetector CT imaging of the chest, abdomen and pelvis was performed following the standard protocol during bolus administration of intravenous contrast. CONTRAST:  30mL ISOVUE-300 IOPAMIDOL (ISOVUE-300) INJECTION 61% COMPARISON:  CT chest dated 08/20/2016. CT chest abdomen pelvis dated 07/06/2016. FINDINGS: CT CHEST FINDINGS Cardiovascular: The heart is normal in size. No pericardial effusion. Mild coronary atherosclerosis the LAD and left circumflex. No evidence of thoracic aortic aneurysm. Mediastinum/Nodes: 2.1 cm subcarinal node (Series 2/ image 27), previously 2.0 cm. Bilateral supraclavicular nodes measuring up to 14 mm short axis, previously 13 mm. Visualized thyroid is unremarkable. Lungs/Pleura: Evaluation of the lung parenchyma is constrained by respiratory motion. 3.4 x 4.7 cm left left lower lobe/ infrahilar mass, reflecting recurrence. Underlying radiation changes. Scattered patchy/nodular opacities bilaterally, progressed from the prior, some of which may reflect infection. These are poorly evaluated given motion degradation. A 9 x 12 mm nodule in the anterior left upper lobe (series 4/ image 88) previously measured 8 x 11 mm. Small to moderate left pleural effusion, increased. Trace right  pleural fluid. No pneumothorax. Musculoskeletal: 2.5 x 5.0 cm metastasis along the right posterior chest wall/ 11th rib (series 2/ image 50), previously 2.7 x 4.6 cm. Widespread/ multifocal lytic and sclerotic osseous metastases in the visualized axial and appendicular skeleton, grossly unchanged. CT ABDOMEN PELVIS FINDINGS Hepatobiliary: Progression of multifocal hepatic metastases, including a dominant 5.1 cm lesion in segment 7 (series 2/ image 54) and a  6.7 cm lesion in segment 3 (series 2/ image 66), previously 3.9 and 4.5 cm respectively. Gallbladder is unremarkable. No intrahepatic or extrahepatic ductal dilatation. Pancreas: Within normal limits. Spleen: Within normal limits. Adrenals/Urinary Tract: Adrenal glands are within normal limits. Kidneys are within normal limits.  No hydronephrosis. Bladder is within normal limits. Stomach/Bowel: Stomach is within normal limits. No evidence of bowel obstruction. Vascular/Lymphatic: No evidence of abdominal aortic aneurysm. Atherosclerotic calcifications of the abdominal aorta and branch vessels. 2.6 x 3.3 cm node in the left mid abdomen/splenic hilum (series 2/ image 61), previously 2.6 x 3.0 cm. Additional upper abdominal lymphadenopathy measuring up to 1.4 cm short axis. Reproductive: Uterus is grossly unremarkable. No adnexal masses. Other: No abdominopelvic ascites. Musculoskeletal: Widespread/diffuse mixed lytic/ sclerotic osseous metastases throughout the visualized axial and appendicular skeleton. Mild progression is suspected. Mild superior endplate compression fracture at L1 (sagittal image 97), new. IMPRESSION: 3.4 x 4.7 cm left lower lobe/ infrahilar mass, compatible with recurrence. Associated pulmonary metastases, poorly evaluated. Moderate left pleural effusion, increased. Thoracic and upper abdominal nodal metastases, progressed. Multifocal hepatic metastases, measuring up to 6.7 cm, progressed. Widespread/diffuse osseous metastases involving the visualized axial and appendicular skeleton. Mild progression is favored. Dominant lesion involving the right posterior 11th rib is grossly unchanged. Mild superior endplate compression fracture at L1, new. Additional ancillary findings as above. Electronically Signed   By: Julian Hy M.D.   On: 10/23/2016 10:13   Ct Abdomen Pelvis W Contrast  Result Date: 10/23/2016 CLINICAL DATA:  Small cell lung cancer, for restaging EXAM: CT CHEST, ABDOMEN, AND  PELVIS WITH CONTRAST TECHNIQUE: Multidetector CT imaging of the chest, abdomen and pelvis was performed following the standard protocol during bolus administration of intravenous contrast. CONTRAST:  46mL ISOVUE-300 IOPAMIDOL (ISOVUE-300) INJECTION 61% COMPARISON:  CT chest dated 08/20/2016. CT chest abdomen pelvis dated 07/06/2016. FINDINGS: CT CHEST FINDINGS Cardiovascular: The heart is normal in size. No pericardial effusion. Mild coronary atherosclerosis the LAD and left circumflex. No evidence of thoracic aortic aneurysm. Mediastinum/Nodes: 2.1 cm subcarinal node (Series 2/ image 27), previously 2.0 cm. Bilateral supraclavicular nodes measuring up to 14 mm short axis, previously 13 mm. Visualized thyroid is unremarkable. Lungs/Pleura: Evaluation of the lung parenchyma is constrained by respiratory motion. 3.4 x 4.7 cm left left lower lobe/ infrahilar mass, reflecting recurrence. Underlying radiation changes. Scattered patchy/nodular opacities bilaterally, progressed from the prior, some of which may reflect infection. These are poorly evaluated given motion degradation. A 9 x 12 mm nodule in the anterior left upper lobe (series 4/ image 88) previously measured 8 x 11 mm. Small to moderate left pleural effusion, increased. Trace right pleural fluid. No pneumothorax. Musculoskeletal: 2.5 x 5.0 cm metastasis along the right posterior chest wall/ 11th rib (series 2/ image 50), previously 2.7 x 4.6 cm. Widespread/ multifocal lytic and sclerotic osseous metastases in the visualized axial and appendicular skeleton, grossly unchanged. CT ABDOMEN PELVIS FINDINGS Hepatobiliary: Progression of multifocal hepatic metastases, including a dominant 5.1 cm lesion in segment 7 (series 2/ image 54) and a 6.7 cm lesion in segment 3 (series 2/ image 66), previously 3.9 and  4.5 cm respectively. Gallbladder is unremarkable. No intrahepatic or extrahepatic ductal dilatation. Pancreas: Within normal limits. Spleen: Within normal  limits. Adrenals/Urinary Tract: Adrenal glands are within normal limits. Kidneys are within normal limits.  No hydronephrosis. Bladder is within normal limits. Stomach/Bowel: Stomach is within normal limits. No evidence of bowel obstruction. Vascular/Lymphatic: No evidence of abdominal aortic aneurysm. Atherosclerotic calcifications of the abdominal aorta and branch vessels. 2.6 x 3.3 cm node in the left mid abdomen/splenic hilum (series 2/ image 61), previously 2.6 x 3.0 cm. Additional upper abdominal lymphadenopathy measuring up to 1.4 cm short axis. Reproductive: Uterus is grossly unremarkable. No adnexal masses. Other: No abdominopelvic ascites. Musculoskeletal: Widespread/diffuse mixed lytic/ sclerotic osseous metastases throughout the visualized axial and appendicular skeleton. Mild progression is suspected. Mild superior endplate compression fracture at L1 (sagittal image 97), new. IMPRESSION: 3.4 x 4.7 cm left lower lobe/ infrahilar mass, compatible with recurrence. Associated pulmonary metastases, poorly evaluated. Moderate left pleural effusion, increased. Thoracic and upper abdominal nodal metastases, progressed. Multifocal hepatic metastases, measuring up to 6.7 cm, progressed. Widespread/diffuse osseous metastases involving the visualized axial and appendicular skeleton. Mild progression is favored. Dominant lesion involving the right posterior 11th rib is grossly unchanged. Mild superior endplate compression fracture at L1, new. Additional ancillary findings as above. Electronically Signed   By: Julian Hy M.D.   On: 10/23/2016 10:13    Medications: I have reviewed the patient's current medications.  Assessment/Plan: 1. Limited stage small cell lung cancer, diagnosed on biopsy of a subcarinal lymph node and left lung mass by EBUSon 09/12/2015  PET scan 09/03/2015 confirmed a hypermetabolic left perihilar mass, 2 left lower lobe pulmonary masses, and mediastinal lymphadenopathy, no  evidence of distant metastatic disease  Brain MRI negative for metastatic disease 09/13/2015  Status post chest radiation 09/26/2015 through 10/18/2015  Cycle 1 cisplatin/etoposide beginning 09/26/2015  Cycle 2 cisplatin/etoposide beginning 10/16/2015  Cycle 3 carboplatin/etoposide beginning 11/06/2015  12/17/2015 MRI of the brain negative  12/17/2015 CT scans chest/abdomen/pelvis (report currently not available)  Bone scan 12/27/2015 with minimal increased uptake in the posterior aspect of the right 11th rib that does not appear abnormal on the CT images of August 2017; sclerotic focus noted on the previous CT scan in the body of L1 showed no abnormal uptake on the current bone scan; asymmetric increased uptake in the left SI joint region possibly corresponding to a subtle sclerotic focus in the medial posterior aspect of the left iliac bone  CT chest 04/07/2016-new liver metastases, new upper abdominal adenopathy, left iliac metastasis, new right posterior lateral Pleural/chest wall metastasis  Cycle 1 salvage chemotherapy with etoposide/carboplatin01/12/2016  Cycle 2 salvage chemotherapy with etoposide/carboplatin 06/09/2016-Neulasta added  Restaging CTs of the head, chest, abdomen, and pelvis on 07/06/2016-progressive skull metastases with intracranial/extracranial extension, stable lung nodules, progressive disease in the liver, bones, enlargement of a right chest wall mass, and enlarging peritoneal nodule  Cycle 1 irinotecan/cisplatin 07/13/2016  Cycle 2 irinotecan/cisplatin 07/27/2016  Cycle 3 irinotecan/cisplatin 08/10/2016  CT right arm 08/14/2016-metastatic lesion proximal right humeral shaft with a moth-eatenappearance. At risk for pathologic fracture. Metastatic lesion in the proximal right radial shaft with apparent extension into adjacent soft tissues.  Status post pelvic radiation to the proximal right humerus and proximal right radius/ulna/distal humerus in 1  fraction 08/26/2016   CT chest 08/20/2016-overall stable disease, mixed response with a decrease in the right chest wall mass and a lingular nodule, slight increase in several of the liver lesions  Cycle 4 irinotecan/cisplatin 08/28/2016  Cycle 5 irinotecan/cisplatin  09/11/2016  Cycle 6 irinotecan/cisplatin 09/24/2016  Cycle 7 irinotecan/cisplatin 10/09/2016  CTs 10/23/2016-progression of liver lesions and a left lung mass. Increased left pleural effusion. Progressing of adenopathy, mild progression of bone metastases  2. 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 Weight loss secondary to #2  4. History of febrile neutropenia following chemotherapy  5. Remote history of a right leg DVT-2008?-Maintained on Lovenox  6. History of tobacco use  7. Admission to [facility] 10/27/2015 with neutropenic fever, esophagitis, and dehydration  8. Hypercalcemia 05/01/2016-status post intravenous fluids and Zometa12/29/2017  9. Fever prior to hospital admission 05/17/2016-infectious versus tumor fever; treated for pneumonia, respiratory panel positive for coronavirus  10. 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 Altered mental status-potentially related to infection/delirium, concern for brain metastases; brain CT 05/19/2016-no brain metastases, multiple skull metastases; improved 05/28/2016, 06/22/2016.  12. History of Leukopenia/thrombocytopenia secondary to chemotherapy-Neulasta added with cycle 2 etoposide/carboplatin  13. Tachycardia/hypotension-persistent,multifactorial   Disposition:  Jenny [name] has progressive small cell lung cancer. She continues to have pain and a poor performance status. I discussed treatment options with Jenny [name] and her family. We reviewed the CT images. I recommend comfort/Hospice care. She  does not wish to enroll in hospice at present. She would like to continue with additional systemic therapy. I explained the chance of a clinical response with further systemic therapy is small.  I will make a referral to [facility] for a second opinion. We will request records from [facility] [place] regarding any molecular testing done they are and to inquire whether she received immunotherapy.  Jenny [name] will return for an office visit within the next 1-2 weeks.  Donneta Romberg, MD  10/23/2016  3:24 PM

## 2016-10-26 ENCOUNTER — Telehealth: Payer: Self-pay | Admitting: Oncology

## 2016-10-26 ENCOUNTER — Telehealth: Payer: Self-pay | Admitting: *Deleted

## 2016-10-26 NOTE — Telephone Encounter (Signed)
Faxed records to [facility]. Waiting for appt.

## 2016-10-26 NOTE — Telephone Encounter (Signed)
Called [facility] spoke with medical records, and case manager: requested last office note and any molecular test results. Pt has had [facility] One testing. They will forward records.

## 2016-10-27 ENCOUNTER — Telehealth: Payer: Self-pay

## 2016-10-27 NOTE — Telephone Encounter (Signed)
Jenny Mile RN with [facility] is at pt house. She called to verify [name] opinion of pt prognosis. Last OV note read to [name]. Currently pt BP is 88/50 laying down and pt is lethargic. HR 112. Jenny Reichmann will talk to pt more about desires of care. Did set her up for fluids tomorrow. Did tell Jenny Reichmann to send pt to ED if further evaluation warrants it.  Pt has not had 2 cans boost today. Jenny Reichmann will encourage eating. Pt did mention her mouth is sore and cindy did see some thrush starting.   Also this was last [name] RN visit and pt is developing a bedsore. Gave VO to extend RN visits, 2x/wk for 1 week then 1x/wk for 2 weeks.

## 2016-10-27 NOTE — Telephone Encounter (Signed)
Called [facility] [phone] again to request last Med[facility]c office note and Foundation One test results.

## 2016-10-28 ENCOUNTER — Telehealth: Payer: Self-pay | Admitting: Oncology

## 2016-10-28 ENCOUNTER — Ambulatory Visit (HOSPITAL_BASED_OUTPATIENT_CLINIC_OR_DEPARTMENT_OTHER): Payer: BLUE CROSS/BLUE SHIELD

## 2016-10-28 ENCOUNTER — Ambulatory Visit: Payer: Self-pay

## 2016-10-28 ENCOUNTER — Ambulatory Visit (HOSPITAL_BASED_OUTPATIENT_CLINIC_OR_DEPARTMENT_OTHER): Payer: BLUE CROSS/BLUE SHIELD | Admitting: Oncology

## 2016-10-28 ENCOUNTER — Other Ambulatory Visit: Payer: Self-pay

## 2016-10-28 VITALS — BP 97/62 | HR 118 | Temp 98.5°F | Resp 18 | Ht 65.0 in | Wt 84.5 lb

## 2016-10-28 DIAGNOSIS — C7951 Secondary malignant neoplasm of bone: Secondary | ICD-10-CM

## 2016-10-28 DIAGNOSIS — E860 Dehydration: Secondary | ICD-10-CM | POA: Diagnosis not present

## 2016-10-28 DIAGNOSIS — G893 Neoplasm related pain (acute) (chronic): Secondary | ICD-10-CM

## 2016-10-28 DIAGNOSIS — C3490 Malignant neoplasm of unspecified part of unspecified bronchus or lung: Secondary | ICD-10-CM

## 2016-10-28 MED ORDER — SODIUM CHLORIDE 0.9 % IV SOLN: Freq: Once | INTRAVENOUS | Status: DC

## 2016-10-28 NOTE — Telephone Encounter (Signed)
No los per 10/28/16 visit.

## 2016-10-28 NOTE — Progress Notes (Signed)
Pt arrived to [facility] vis w/c with husband in attendance.    Pt very pale, frail. Weight 84 lbs.  Very lethargic but able to answer questions. Aware of surroundings. Oriented to person and place. Forgetful. See VS flowsheet. Hypotensive and 02 sat low @ 87. Pt says she does feel SOB at rest. Started pt on 02 @ 2l/min [place]. S02 back up to 99%. Dr. Benay Spice made aware of pt condition and he will be in to see/examine pt.  IV started eventually in left ACF. Poor venous access d/t low BP and dehydration. Also veins very sclerosed from being used  Frequently for treatment/infusions.  Pt has pain on sacral area d/t pressure sore. Area seen by home health nurse yesterday in pt's home. Unable to examine in current state d/t recliner and being fully clothed. Repositioned pt with pillow support. Husband brought home meds and gave pt 2 oxycodone tabs with sips of water. This has eased pain.   Seen and examined per Dr. Benay Spice.  Pt has 'perked up' a bit after one liter of IV fluid. See VS flowsheet. Pt and husband desire to go home. They appt @ [facility] for second opinion tomorrow that pt intends to keep. Declined Hospice @ this time.  Pt and husband understand they can callif/when need arises. Follow up with Dr. Benay Spice on 11/02/16

## 2016-10-28 NOTE — Patient Instructions (Signed)
Dehydration, Adult Dehydration is a condition in which there is not enough fluid or water in the body. This happens when you lose more fluids than you take in. Important organs, such as the kidneys, brain, and heart, cannot function without a proper amount of fluids. Any loss of fluids from the body can lead to dehydration. Dehydration can range from mild to severe. This condition should be treated right away to prevent it from becoming severe. What are the causes? This condition may be caused by:  Vomiting.  Diarrhea.  Excessive sweating, such as from heat exposure or exercise.  Not drinking enough fluid, especially: ? When ill. ? While doing activity that requires a lot of energy.  Excessive urination.  Fever.  Infection.  Certain medicines, such as medicines that cause the body to lose excess fluid (diuretics).  Inability to access safe drinking water.  Reduced physical ability to get adequate water and food.  What increases the risk? This condition is more likely to develop in people:  Who have a poorly controlled long-term (chronic) illness, such as diabetes, heart disease, or kidney disease.  Who are age 65 or older.  Who are disabled.  Who live in a place with high altitude.  Who play endurance sports.  What are the signs or symptoms? Symptoms of mild dehydration may include:  Thirst.  Dry lips.  Slightly dry mouth.  Dry, warm skin.  Dizziness. Symptoms of moderate dehydration may include:  Very dry mouth.  Muscle cramps.  Dark urine. Urine may be the color of tea.  Decreased urine production.  Decreased tear production.  Heartbeat that is irregular or faster than normal (palpitations).  Headache.  Light-headedness, especially when you stand up from a sitting position.  Fainting (syncope). Symptoms of severe dehydration may include:  Changes in skin, such as: ? Cold and clammy skin. ? Blotchy (mottled) or pale skin. ? Skin that does  not quickly return to normal after being lightly pinched and released (poor skin turgor).  Changes in body fluids, such as: ? Extreme thirst. ? No tear production. ? Inability to sweat when body temperature is high, such as in hot weather. ? Very little urine production.  Changes in vital signs, such as: ? Weak pulse. ? Pulse that is more than 100 beats a minute when sitting still. ? Rapid breathing. ? Low blood pressure.  Other changes, such as: ? Sunken eyes. ? Cold hands and feet. ? Confusion. ? Lack of energy (lethargy). ? Difficulty waking up from sleep. ? Short-term weight loss. ? Unconsciousness. How is this diagnosed? This condition is diagnosed based on your symptoms and a physical exam. Blood and urine tests may be done to help confirm the diagnosis. How is this treated? Treatment for this condition depends on the severity. Mild or moderate dehydration can often be treated at home. Treatment should be started right away. Do not wait until dehydration becomes severe. Severe dehydration is an emergency and it needs to be treated in a hospital. Treatment for mild dehydration may include:  Drinking more fluids.  Replacing salts and minerals in your blood (electrolytes) that you may have lost. Treatment for moderate dehydration may include:  Drinking an oral rehydration solution (ORS). This is a drink that helps you replace fluids and electrolytes (rehydrate). It can be found at pharmacies and retail stores. Treatment for severe dehydration may include:  Receiving fluids through an IV tube.  Receiving an electrolyte solution through a feeding tube that is passed through your nose   and into your stomach (nasogastric tube, or NG tube).  Correcting any abnormalities in electrolytes.  Treating the underlying cause of dehydration. Follow these instructions at home:  If directed by your health care provider, drink an ORS: ? Make an ORS by following instructions on the  package. ? Start by drinking small amounts, about  cup (120 mL) every 5-10 minutes. ? Slowly increase how much you drink until you have taken the amount recommended by your health care provider.  Drink enough clear fluid to keep your urine clear or pale yellow. If you were told to drink an ORS, finish the ORS first, then start slowly drinking other clear fluids. Drink fluids such as: ? Water. Do not drink only water. Doing that can lead to having too little salt (sodium) in the body (hyponatremia). ? Ice chips. ? Fruit juice that you have added water to (diluted fruit juice). ? Low-calorie sports drinks.  Avoid: ? Alcohol. ? Drinks that contain a lot of sugar. These include high-calorie sports drinks, fruit juice that is not diluted, and soda. ? Caffeine. ? Foods that are greasy or contain a lot of fat or sugar.  Take over-the-counter and prescription medicines only as told by your health care provider.  Do not take sodium tablets. This can lead to having too much sodium in the body (hypernatremia).  Eat foods that contain a healthy balance of electrolytes, such as bananas, oranges, potatoes, tomatoes, and spinach.  Keep all follow-up visits as told by your health care provider. This is important. Contact a health care provider if:  You have abdominal pain that: ? Gets worse. ? Stays in one area (localizes).  You have a rash.  You have a stiff neck.  You are more irritable than usual.  You are sleepier or more difficult to wake up than usual.  You feel weak or dizzy.  You feel very thirsty.  You have urinated only a small amount of very dark urine over 6-8 hours. Get help right away if:  You have symptoms of severe dehydration.  You cannot drink fluids without vomiting.  Your symptoms get worse with treatment.  You have a fever.  You have a severe headache.  You have vomiting or diarrhea that: ? Gets worse. ? Does not go away.  You have blood or green matter  (bile) in your vomit.  You have blood in your stool. This may cause stool to look black and tarry.  You have not urinated in 6-8 hours.  You faint.  Your heart rate while sitting still is over 100 beats a minute.  You have trouble breathing. This information is not intended to replace advice given to you by your health care provider. Make sure you discuss any questions you have with your health care provider. Document Released: 04/20/2005 Document Revised: 11/15/2015 Document Reviewed: 06/14/2015 Elsevier Interactive Patient Education  2018 [employer]vier [employer]  

## 2016-10-28 NOTE — Telephone Encounter (Signed)
Appointment scheduled and confirmed with patient for 07/03 @ 11:15, per 10/23/16 los. Referral request routed to [facility].

## 2016-10-28 NOTE — Progress Notes (Signed)
[facility] OFFICE PROGRESS NOTE   Diagnosis: Small cell lung cancer  INTERVAL HISTORY:   Ms. Jacquez return for IV fluids. I was asked to see her secondary to hypotension and general weakness. She reports stable pain. She continues to have pain at the sacrum. She is eating and ambulatory in the home, though she is not caring for herself completely.  Objective:  Vital signs in last 24 hours:  There were no vitals taken for this visit.  HEENT: Multiple scalp masses Resp: Bronchial sounds at the left upper posterior chest, no respiratory distress Cardio: Regular rate and rhythm GI: Nontender Vascular: Trace pitting edema at the low leg bilaterally Neuro: Alert, follows commands, the tongue deviates to the left     Lab Results:  Lab Results  Component Value Date   WBC 2.8 (L) 10/15/2016   HGB 11.2 (L) 10/15/2016   HCT 34.3 (L) 10/15/2016   MCV 86.4 10/15/2016   PLT 57 (L) 10/15/2016   NEUTROABS 1.9 10/15/2016    CMP     Component Value Date/Time   NA 133 (L) 10/15/2016 0153   NA 136 10/09/2016 0854   K 4.4 10/15/2016 0153   K 4.9 10/09/2016 0854   CL 98 (L) 10/15/2016 0153   CO2 23 10/15/2016 0153   CO2 24 10/09/2016 0854   GLUCOSE 104 (H) 10/15/2016 0153   GLUCOSE 138 10/09/2016 0854   BUN 25 (H) 10/15/2016 0153   BUN 16.2 10/09/2016 0854   CREATININE 0.84 10/15/2016 0153   CREATININE 0.8 10/09/2016 0854   CALCIUM 8.9 10/15/2016 0153   CALCIUM 9.9 10/09/2016 0854   PROT 7.1 10/15/2016 0153   PROT 6.7 10/09/2016 0854   ALBUMIN 3.1 (L) 10/15/2016 0153   ALBUMIN 2.7 (L) 10/09/2016 0854   AST 27 10/15/2016 0153   AST 29 10/09/2016 0854   ALT 14 10/15/2016 0153   ALT 16 10/09/2016 0854   ALKPHOS 176 (H) 10/15/2016 0153   ALKPHOS 278 (H) 10/09/2016 0854   BILITOT 0.6 10/15/2016 0153   BILITOT 0.45 10/09/2016 0854   GFRNONAA >60 10/15/2016 0153   GFRAA >60 10/15/2016 0153     Medications: I have reviewed the patient's current  medications.  Assessment/Plan:  1. Limited stage small cell lung cancer, diagnosed on biopsy of a subcarinal lymph node and left lung mass by EBUSon 09/12/2015  PET scan 09/03/2015 confirmed a hypermetabolic left perihilar mass, 2 left lower lobe pulmonary masses, and mediastinal lymphadenopathy, no evidence of distant metastatic disease  Brain MRI negative for metastatic disease 09/13/2015  Status post chest radiation 09/26/2015 through 10/18/2015  Cycle 1 cisplatin/etoposide beginning 09/26/2015  Cycle 2 cisplatin/etoposide beginning 10/16/2015  Cycle 3 carboplatin/etoposide beginning 11/06/2015  12/17/2015 MRI of the brain negative  12/17/2015 CT scans chest/abdomen/pelvis (report currently not available)  Bone scan 12/27/2015 with minimal increased uptake in the posterior aspect of the right 11th rib that does not appear abnormal on the CT images of August 2017; sclerotic focus noted on the previous CT scan in the body of L1 showed no abnormal uptake on the current bone scan; asymmetric increased uptake in the left SI joint region possibly corresponding to a subtle sclerotic focus in the medial posterior aspect of the left iliac bone  CT chest 04/07/2016-new liver metastases, new upper abdominal adenopathy, left iliac metastasis, new right posterior lateral Pleural/chest wall metastasis  Cycle 1 salvage chemotherapy with etoposide/carboplatin01/12/2016  Cycle 2 salvage chemotherapy with etoposide/carboplatin 06/09/2016-Neulasta added  Restaging CTs of the head, chest, abdomen,  and pelvis on 07/06/2016-progressive skull metastases with intracranial/extracranial extension, stable lung nodules, progressive disease in the liver, bones, enlargement of a right chest wall mass, and enlarging peritoneal nodule  Cycle 1 irinotecan/cisplatin 07/13/2016  Cycle 2 irinotecan/cisplatin 07/27/2016  Cycle 3 irinotecan/cisplatin 08/10/2016  CT right arm 08/14/2016-metastatic lesion  proximal right humeral shaft with a moth-eatenappearance. At risk for pathologic fracture. Metastatic lesion in the proximal right radial shaft with apparent extension into adjacent soft tissues.  Status post pelvic radiation to the proximal right humerus and proximal right radius/ulna/distal humerus in 1 fraction 08/26/2016   CT chest 08/20/2016-overall stable disease, mixed response with a decrease in the right chest wall mass and a lingular nodule, slight increase in several of the liver lesions  Cycle 4 irinotecan/cisplatin 08/28/2016  Cycle 5 irinotecan/cisplatin 09/11/2016  Cycle 6 irinotecan/cisplatin 09/24/2016  Cycle 7 irinotecan/cisplatin 10/09/2016  CTs 10/23/2016-progression of liver lesions and a left lung mass. Increased left pleural effusion. Progressing of adenopathy, mild progression of bone metastases  2. 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 Weight loss secondary to #2  4. History of febrile neutropenia following chemotherapy  5. Remote history of a right leg DVT-2008?-Maintained on Lovenox  6. History of tobacco use  7. Admission to [facility] 10/27/2015 with neutropenic fever, esophagitis, and dehydration  8. Hypercalcemia 05/01/2016-status post intravenous fluids and Zometa12/29/2017  9. Fever prior to hospital admission 05/17/2016-infectious versus tumor fever; treated for pneumonia, respiratory panel positive for coronavirus  10. 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 Altered mental status-potentially related to infection/delirium, concern for brain metastases; brain CT 05/19/2016-no brain metastases, multiple skull metastases; improved 05/28/2016, 06/22/2016.  12. History of Leukopenia/thrombocytopenia secondary to chemotherapy-Neulasta added with cycle 2 etoposide/carboplatin  13.  Tachycardia/hypotension-persistent,multifactorial   Disposition:  Ms. Hudman has extensive stage small cell lung cancer. The metastatic tumor burden has progressed following recent irinotecan/cisplatin chemotherapy. We have requested molecular testing results and records regarding any immunotherapy received from the [facility] [place] in [place]. We have not received these results.  She has a poor performance status. I recommend Hospice care. She stated again today that she does not wish to consider hospice at present. She is scheduled for a second opinion appointment at [facility] on 10/30/2016.  Ms. Estrella will return for an office visit and further discussion on 11/02/2016. She received intravenous fluids today.  25 minutes were spent with the patient today. The majority of the time was used for counseling and coordination of care.  Donneta Romberg, MD  10/28/2016  6:44 PM

## 2016-10-30 ENCOUNTER — Telehealth: Payer: Self-pay | Admitting: *Deleted

## 2016-10-30 NOTE — Telephone Encounter (Signed)
Call from Dr. Truman Hayward @ [facility]: saw patient today. Recommended hospice as well. Offered to admit patient for palliation, she declined. MD could not recommend a trial or any additional treatment.  Will make Dr. Benay Spice aware.

## 2016-10-30 NOTE — Telephone Encounter (Signed)
Left another message at [facility] medical records dept requesting [facility], last office note and chemo flowsheets.

## 2016-11-02 ENCOUNTER — Telehealth: Payer: Self-pay | Admitting: *Deleted

## 2016-11-02 NOTE — Telephone Encounter (Signed)
"  Jenny [name] with [facility].  I tried to talk with them last week about Hospice but Jenny [name] and her husband were planning to get a second opinion.  It was received and not good.  Her husband has told me they would like Hospice ordered as soon as possible.  Would Dr. Benay Spice order Hospice?" Verbal order received and read back from Dr. Benay Spice for [name].  Order given to [facility] at this time.   Called Hospice providing referral order and diagnosis.  "No need to fax anything.  Will obtain demographics, med list and last office information from [facility]."

## 2016-11-03 ENCOUNTER — Ambulatory Visit (HOSPITAL_BASED_OUTPATIENT_CLINIC_OR_DEPARTMENT_OTHER): Payer: BLUE CROSS/BLUE SHIELD | Admitting: Nurse Practitioner

## 2016-11-03 DIAGNOSIS — C787 Secondary malignant neoplasm of liver and intrahepatic bile duct: Secondary | ICD-10-CM

## 2016-11-03 DIAGNOSIS — C3490 Malignant neoplasm of unspecified part of unspecified bronchus or lung: Secondary | ICD-10-CM

## 2016-11-03 DIAGNOSIS — C7951 Secondary malignant neoplasm of bone: Secondary | ICD-10-CM

## 2016-11-03 DIAGNOSIS — C7800 Secondary malignant neoplasm of unspecified lung: Secondary | ICD-10-CM | POA: Diagnosis not present

## 2016-11-03 DIAGNOSIS — R451 Restlessness and agitation: Secondary | ICD-10-CM

## 2016-11-03 DIAGNOSIS — C3492 Malignant neoplasm of unspecified part of left bronchus or lung: Secondary | ICD-10-CM | POA: Diagnosis not present

## 2016-11-03 DIAGNOSIS — G893 Neoplasm related pain (acute) (chronic): Secondary | ICD-10-CM

## 2016-11-03 MED ORDER — OXYCODONE HCL ER 10 MG PO T12A: 10.0000 mg | EXTENDED_RELEASE_TABLET | Freq: Two times a day (BID) | ORAL | 0 refills | Status: AC

## 2016-11-03 MED ORDER — OXYCODONE HCL 5 MG PO TABS: 5.0000 mg | ORAL_TABLET | ORAL | 0 refills | Status: AC | PRN

## 2016-11-03 NOTE — Progress Notes (Addendum)
[facility] OFFICE PROGRESS NOTE   Diagnosis:  Small cell lung cancer  INTERVAL HISTORY:   Jenny [name] returns as scheduled. She was seen by Dr. Truman Hayward at  [facility]. Hospice was recommended. Her family contacted the office yesterday to request a hospice referral. Pain is not well controlled at present. Her husband thinks this is due to to coming into the office today. She continues OxyContin 10 mg every 12 hours with oxycodone every 4 hours, 10-15 mg at a time. She is intermittently confused. She is in bed most of the day. Appetite is poor. She fell last week. She is intermittently agitated/restless. She takes an Xanax at bedtime.  Objective:  Vital signs in last 24 hours:  Blood pressure (!) 68/42, pulse (!) 140.    HEENT: Oral candidiasis. Resp: Lungs are clear. Cardio: Regular, tachycardic. GI: Abdomen is nontender. Vascular: Trace ankle edema bilaterally. Neuro: Responds to questions intermittently.    Lab Results:  Lab Results  Component Value Date   WBC 2.8 (L) 10/15/2016   HGB 11.2 (L) 10/15/2016   HCT 34.3 (L) 10/15/2016   MCV 86.4 10/15/2016   PLT 57 (L) 10/15/2016   NEUTROABS 1.9 10/15/2016    Imaging:  No results found.  Medications: I have reviewed the patient's current medications.  Assessment/Plan: 1. Limited stage small cell lung cancer, diagnosed on biopsy of a subcarinal lymph node and left lung mass by [facility] 09/12/2015  PET scan 09/03/2015 confirmed a hypermetabolic left perihilar mass, 2 left lower lobe pulmonary masses, and mediastinal lymphadenopathy, no evidence of distant metastatic disease  Brain MRI negative for metastatic disease 09/13/2015  Status post chest radiation 09/26/2015 through 10/18/2015  Cycle 1 cisplatin/etoposide beginning 09/26/2015  Cycle 2 cisplatin/etoposide beginning 10/16/2015  Cycle 3 carboplatin/etoposide beginning 11/06/2015  12/17/2015 MRI of the brain negative  12/17/2015 CT scans  chest/abdomen/pelvis (report currently not available)  Bone scan 12/27/2015 with minimal increased uptake in the posterior aspect of the right 11th rib that does not appear abnormal on the CT images of August 2017; sclerotic focus noted on the previous CT scan in the body of L1 showed no abnormal uptake on the current bone scan; asymmetric increased uptake in the left SI joint region possibly corresponding to a subtle sclerotic focus in the medial posterior aspect of the left iliac bone  CT chest 04/07/2016-new liver metastases, new upper abdominal adenopathy, left iliac metastasis, new right posterior lateral Pleural/chest wall metastasis  Cycle 1 salvage chemotherapy with etoposide/carboplatin01/12/2016  Cycle 2 salvage chemotherapy with etoposide/carboplatin 06/09/2016-Neulasta added  Restaging CTs of the head, chest, abdomen, and pelvis on 07/06/2016-progressive skull metastases with intracranial/extracranial extension, stable lung nodules, progressive disease in the liver, bones, enlargement of a right chest wall mass, and enlarging peritoneal nodule  Cycle 1 irinotecan/cisplatin 07/13/2016  Cycle 2 irinotecan/cisplatin 07/27/2016  Cycle 3 irinotecan/cisplatin 08/10/2016  CT right arm 08/14/2016-metastatic lesion proximal right humeral shaft with a moth-eatenappearance. At risk for pathologic fracture. Metastatic lesion in the proximal right radial shaft with apparent extension into adjacent soft tissues.  Status post pelvic radiation to the proximal right humerus and proximal right radius/ulna/distal humerus in 1 fraction 08/26/2016   CT chest 08/20/2016-overall stable disease, mixed response with a decrease in the right chest wall mass and a lingular nodule, slight increase in several of the liver lesions  Cycle 4 irinotecan/cisplatin 08/28/2016  Cycle 5 irinotecan/cisplatin 09/11/2016  Cycle 6 irinotecan/cisplatin 09/24/2016  Cycle 7 irinotecan/cisplatin 10/09/2016  CTs  10/23/2016-progression of liver lesions and a left lung  mass. Increased left pleural effusion. Progression of adenopathy, mild progression of bone metastases  2. 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 Weight loss secondary to #2  4. History of febrile neutropenia following chemotherapy  5. Remote history of a right leg DVT-2008?-Maintained on Lovenox  6. History of tobacco use  7. Admission to [facility] 10/27/2015 with neutropenic fever, esophagitis, and dehydration  8. Hypercalcemia 05/01/2016-status post intravenous fluids and Zometa12/29/2017  9. Fever prior to hospital admission 05/17/2016-infectious versus tumor fever; treated for pneumonia, respiratory panel positive for coronavirus  10. 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 Altered mental status-potentially related to infection/delirium, concern for brain metastases; brain CT 05/19/2016-no brain metastases, multiple skull metastases; improved 05/28/2016, 06/22/2016.  12. History of Leukopenia/thrombocytopenia secondary to chemotherapy-Neulasta added with cycle 2 etoposide/carboplatin  13. Tachycardia/hypotension-persistent,multifactorial   Disposition: Jenny [name] continues to decline. She and her family understand this is due to progression of the cancer. They are in agreement with a hospice referral. We discussed end-of-life issues/CODE STATUS. She will be placed on NO CODE BLUE status. For now she will continue the current pain regimen with OxyContin and oxycodone. We will make adjustments as needed. For the agitation she will try Xanax during the day as needed.  We scheduled a return visit in 3 weeks. She or her family will contact the office in the interim with any problems.  Patient seen with Dr. Benay Spice.  Jenny [name], Jenny [name] ANP/GNP-BC   11/03/2016    12:34 PM  This was a shared visit with Ned Card. Jenny [name] was seen by Dr. Truman Hayward at [facility] on 10/29/2016. She recommends Hospice care. Jenny [name] and her family are in agreement. She will be placed on a no CODE BLUE status.  Julieanne Manson, M.D.

## 2016-11-10 ENCOUNTER — Telehealth: Payer: Self-pay | Admitting: Oncology

## 2016-11-10 NOTE — Telephone Encounter (Signed)
Horris Latino from hospice called to inform us of the death of Jenny [name] on 11/10/2016 at 1:40pm at home

## 2016-12-02 DEATH — deceased

## 2016-12-16 ENCOUNTER — Other Ambulatory Visit: Payer: Self-pay | Admitting: Nurse Practitioner

## 2018-06-16 IMAGING — DX DG CHEST 1V PORT
1 series · 1 of 1 positions shown · non-contrast
Comparison: CT 08/20/2016.  Chest x-ray 05/17/2016 .

CLINICAL DATA: Lung cancer.

EXAM:
PORTABLE CHEST 1 VIEW

[chest ap]
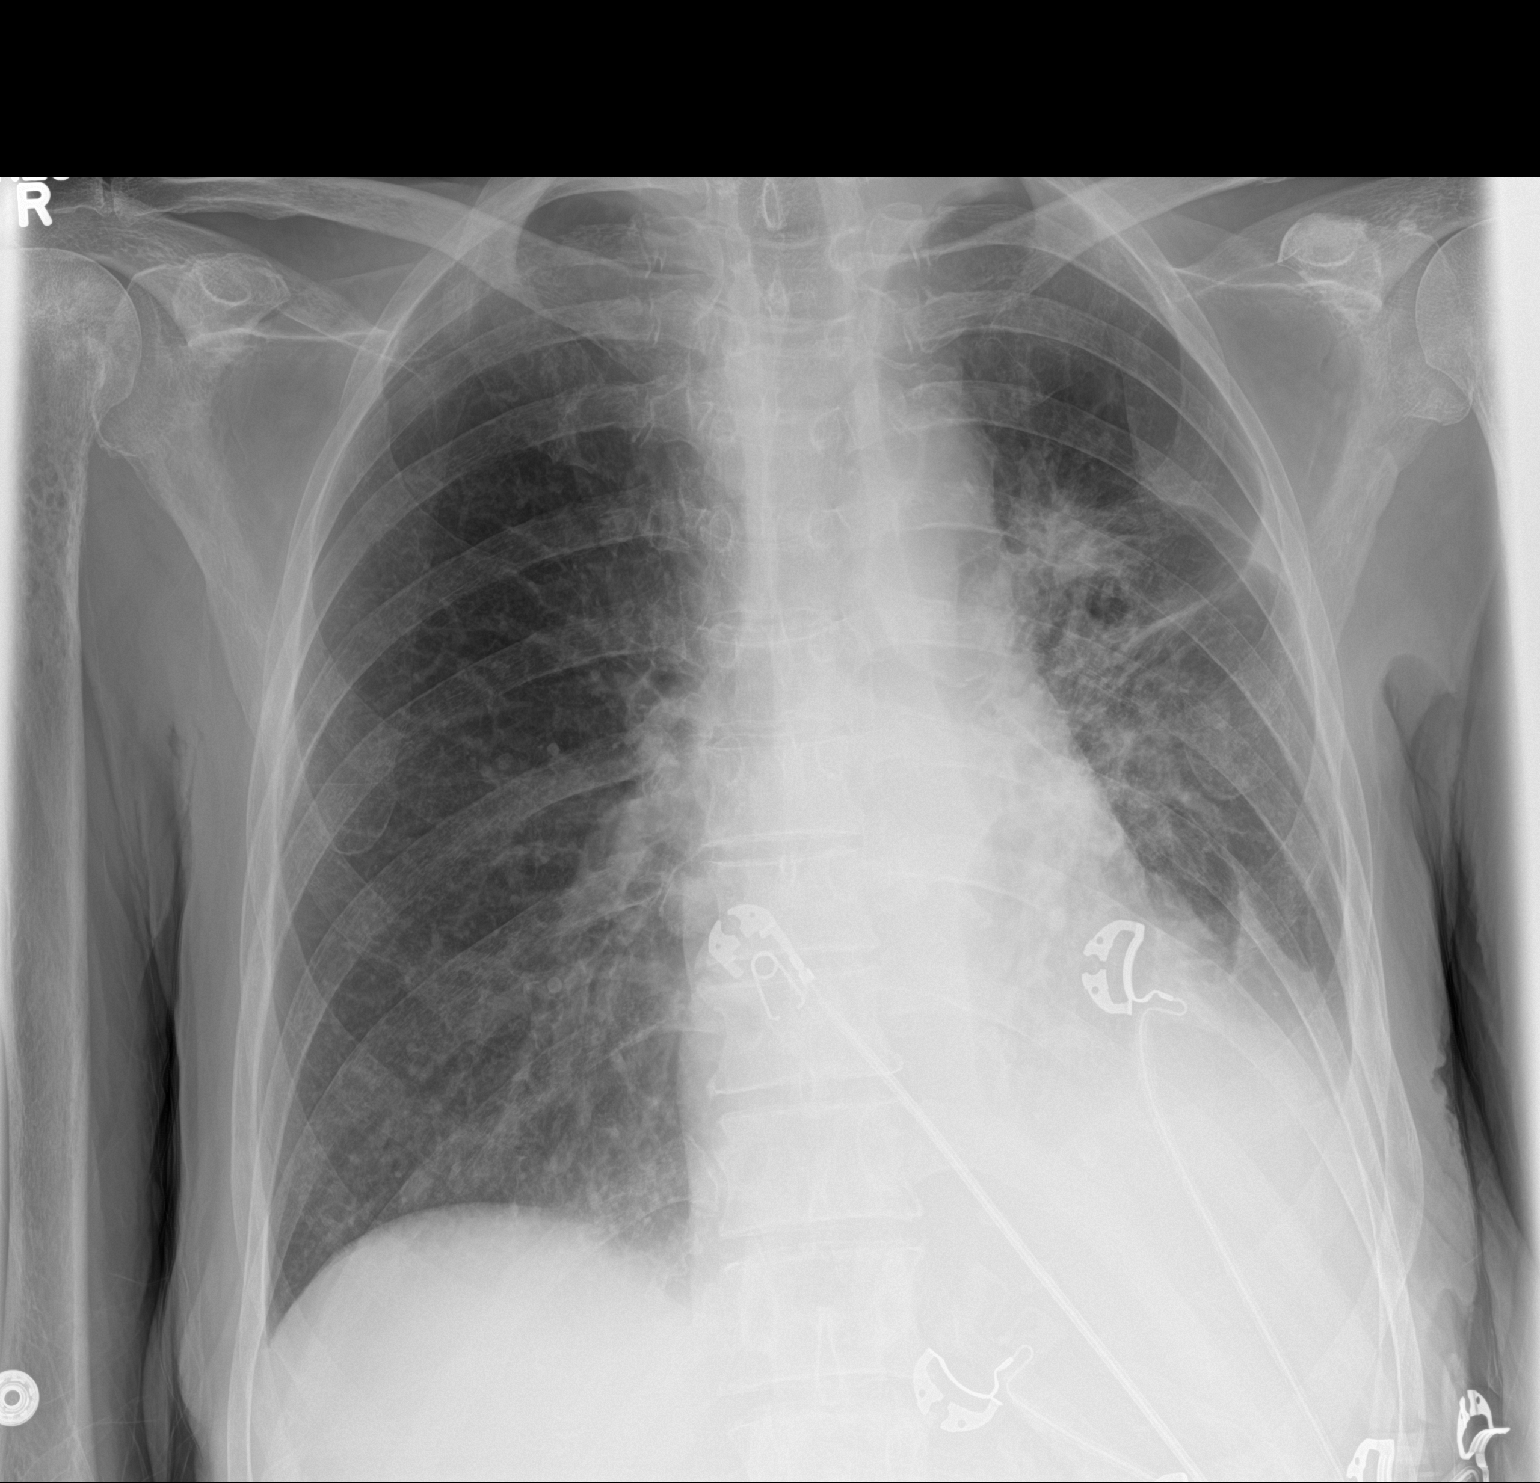

[1 of 1 positions shown; findings below may reference images not displayed]

FINDINGS: Mediastinum is stable. Persistent left suprahilar density and very
interstitial interstitial prominence. Findings may be related to
treatment. Pneumonitis cannot be excluded. Pleural thickening on the
left noted most likely related to scarring. Small effusion cannot be
excluded. No acute bony abnormality identified. Stable mild
sclerotic changes right humeral head .
IMPRESSION: 1. Left suprahilar density and perihilar interstitial prominence
possibly related to radiation treatment for the patient's known lung
cancer. Pneumonitis cannot be excluded.

2. Left-sided pleural thickening, most likely related scarring.
Small left pleural effusion cannot be excluded

## 2018-07-02 IMAGING — CT CT HEAD W/O CM
3 of 4 series · 15 of 47 positions shown, 18 images · non-contrast
Comparison: CT HEAD July 06, 2016

CLINICAL DATA: Increasing confusion, disoriented and nonverbal for
a few days. History of lung cancer with known skull metastasis.

EXAM:
CT HEAD WITHOUT CONTRAST
TECHNIQUE: Contiguous axial images were obtained from the base of the skull
through the vertex without intravenous contrast.

[Series 2: head w/o · axial · non-contrast · 0.45mm/px · z∈[+1585,+1715]mm · 9 of 32 slices shown, 12 images]
[im 3/32  brain]
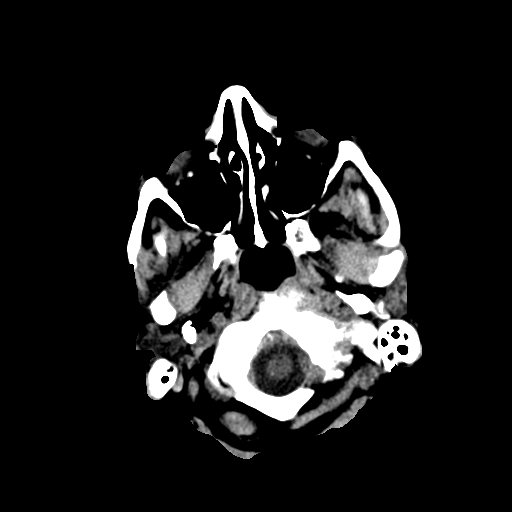
[im 3/32  bone]
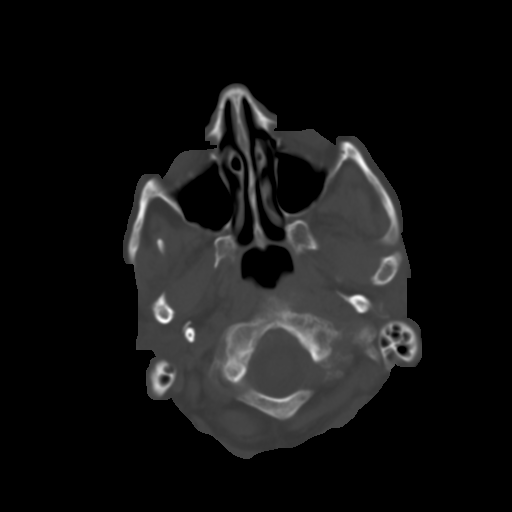
[im 7/32  brain]
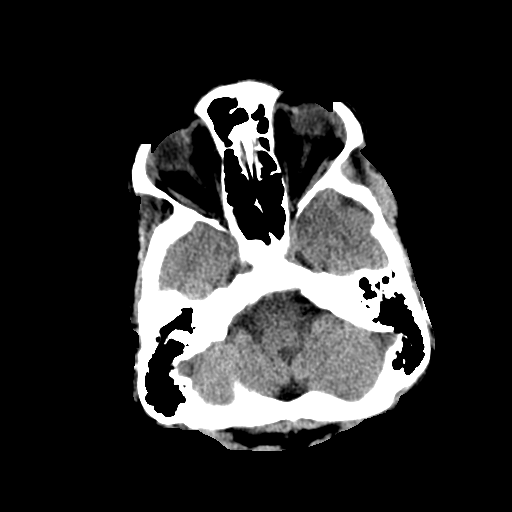
[im 9/32  brain]
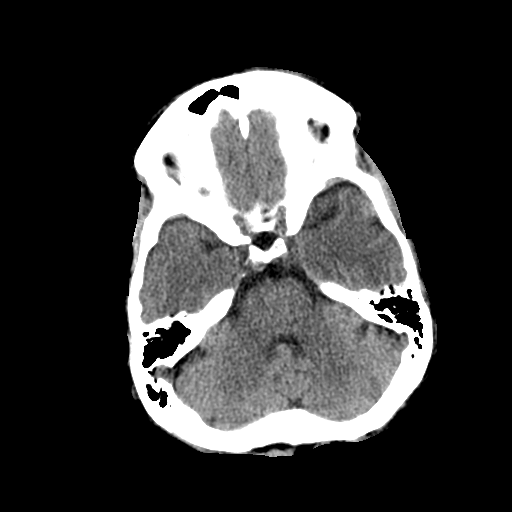
[im 14/32  brain]
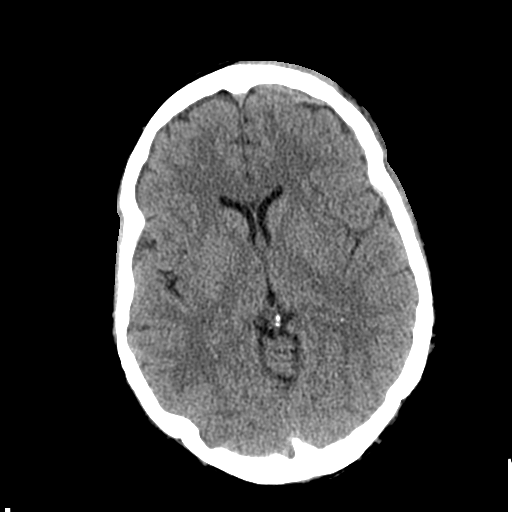
[im 16/32  brain]
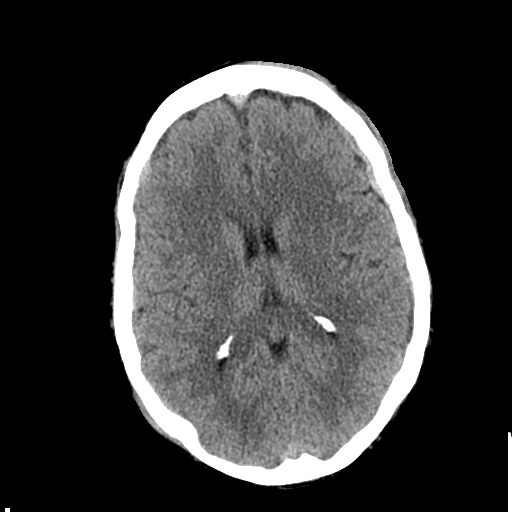
[im 16/32  bone]
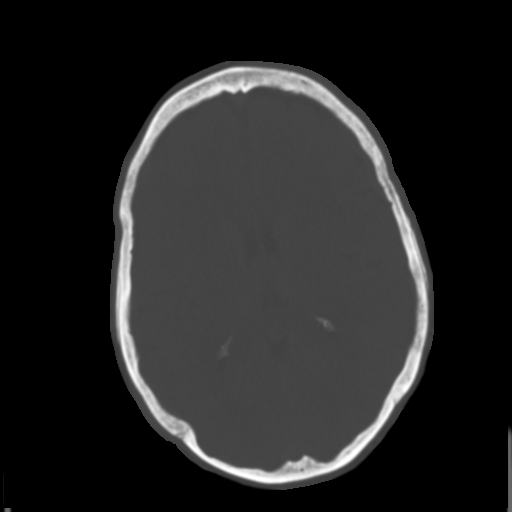
[im 18/32  brain]
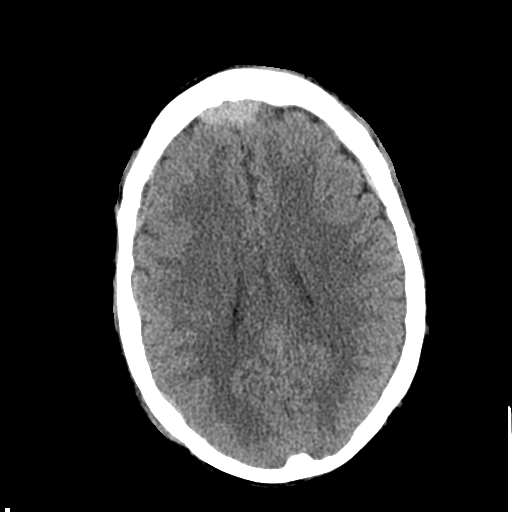
[im 23/32  brain]
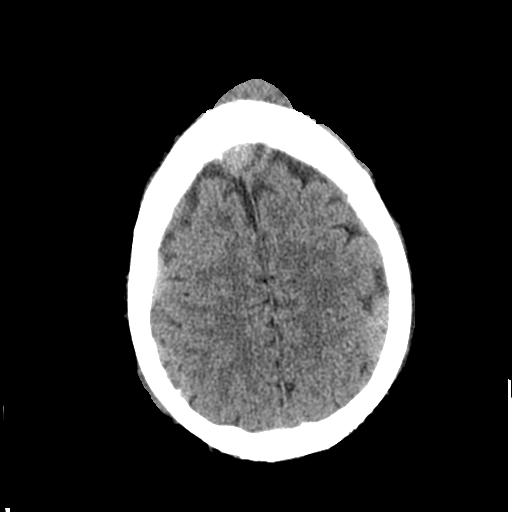
[im 25/32  brain]
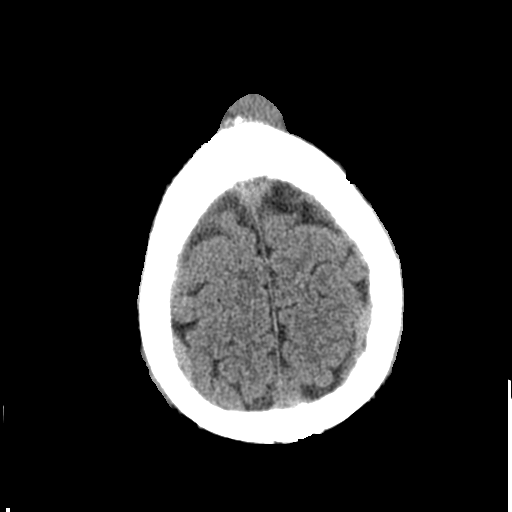
[im 29/32  brain]
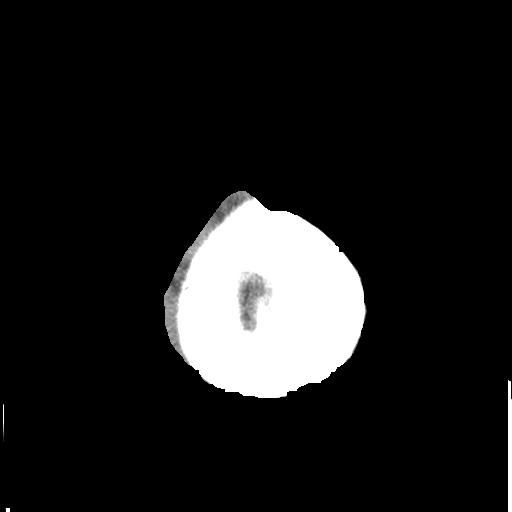
[im 29/32  bone]
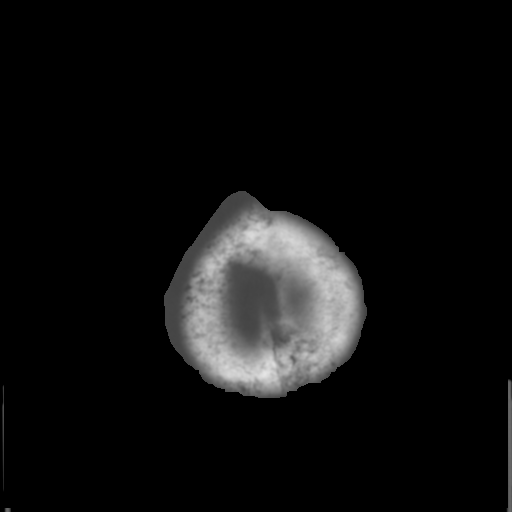

[Series 4: coronal · coronal · 0.29mm/px · 3 of 65 slices shown]
[im 22/65  brain]
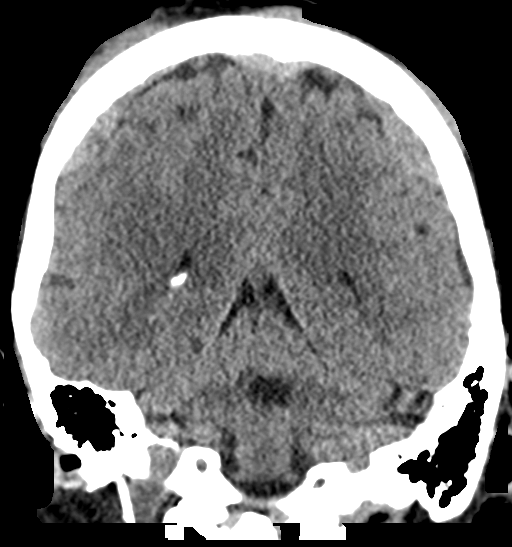
[im 29/65  brain]
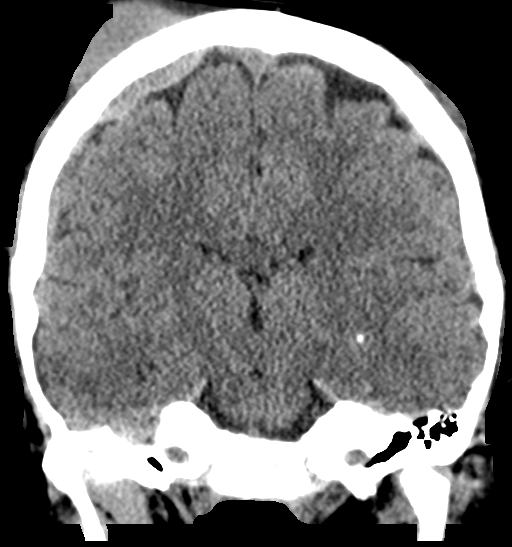
[im 36/65  brain]
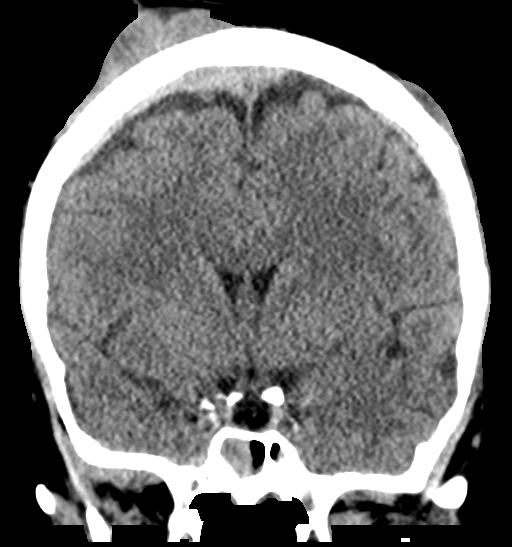

[Series 5: sagittal · sagittal · 0.31mm/px · 3 of 48 slices shown]
[im 16/48  brain]
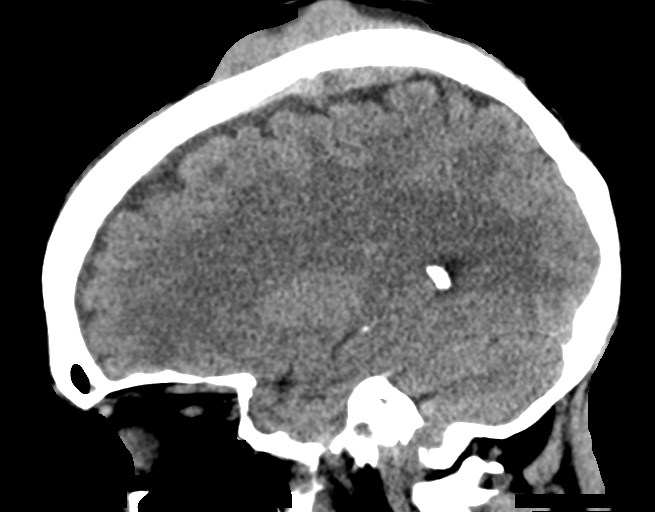
[im 24/48  brain]
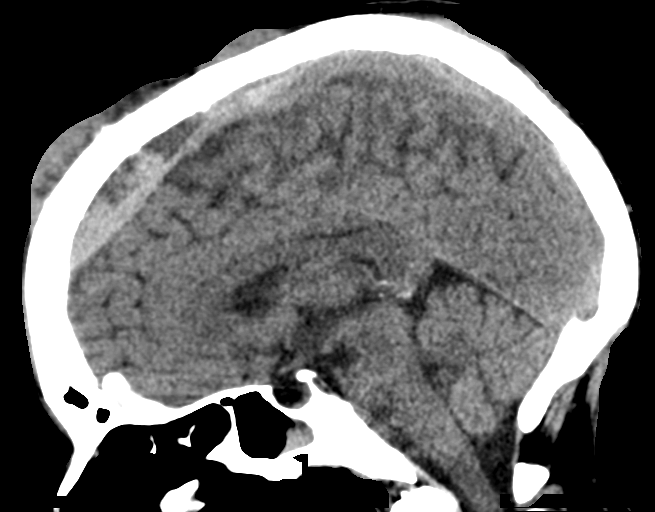
[im 32/48  brain]
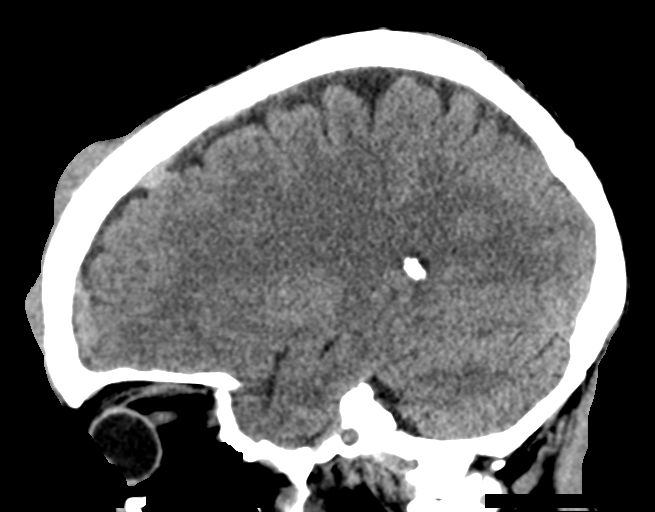

[15 of 47 positions shown; findings below may reference images not displayed]

FINDINGS: BRAIN: Increasing size of previous existing calvarial metastasis
with dural invasion. LEFT frontal metastasis previously demonstrated
4 mm intracranial component, now 8 mm in AP dimension with chronic
superior sagittal sinus invasion. New LEFT frontal calvarial
metastasis with 4 mm intracranial extension. No intraparenchymal
hemorrhage, significant mass effect nor midline shift. The
ventricles and sulci are normal for age. No acute large vascular
territory infarcts. No abnormal extra-axial fluid collections. Basal
cisterns are patent.

VASCULAR: Trace calcific atherosclerosis of the carotid siphons.

SKULL: No skull fracture. Diffuse osseous metastasis including
skullbase with permeative to lytic appearance. Enlarging scalp
metastasis.

SINUSES/ORBITS: Sphenoid mucosal thickening. Mastoid air cells are
well aerated.The included ocular globes and orbital contents are
non-suspicious.

OTHER: None.
IMPRESSION: Multiple new and increasing size of previous existing calvarial
metastasis with dural invasion. No midline shift or acute
intracranial process.
